# Patient Record
Sex: Male | Born: 1995 | Race: White | Hispanic: No | Marital: Married | Smoking: Former smoker
Health system: Southern US, Community
[De-identification: ages and names within clinical notes are randomized; demographics above are authoritative.]

## PROBLEM LIST (undated history)

## (undated) DIAGNOSIS — N19 Unspecified kidney failure: Secondary | ICD-10-CM

## (undated) DIAGNOSIS — K219 Gastro-esophageal reflux disease without esophagitis: Secondary | ICD-10-CM

## (undated) DIAGNOSIS — N028 Recurrent and persistent hematuria with other morphologic changes: Secondary | ICD-10-CM

## (undated) DIAGNOSIS — J189 Pneumonia, unspecified organism: Secondary | ICD-10-CM

## (undated) DIAGNOSIS — N02B9 Other recurrent and persistent immunoglobulin A nephropathy: Secondary | ICD-10-CM

## (undated) DIAGNOSIS — N170 Acute kidney failure with tubular necrosis: Secondary | ICD-10-CM

## (undated) DIAGNOSIS — N12 Tubulo-interstitial nephritis, not specified as acute or chronic: Secondary | ICD-10-CM

## (undated) DIAGNOSIS — R7989 Other specified abnormal findings of blood chemistry: Secondary | ICD-10-CM

## (undated) HISTORY — PX: NO PAST SURGERIES: SHX2092

## (undated) HISTORY — PX: RENAL BIOPSY: SHX156

## (undated) HISTORY — PX: WISDOM TOOTH EXTRACTION: SHX21

## (undated) HISTORY — PX: GALLBLADDER SURGERY: SHX652

## (undated) HISTORY — DX: Other recurrent and persistent immunoglobulin A nephropathy: N02.B9

## (undated) HISTORY — DX: Recurrent and persistent hematuria with other morphologic changes: N02.8

## (undated) HISTORY — DX: Acute kidney failure with tubular necrosis: N17.0

---
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
## 2014-06-30 ENCOUNTER — Encounter (HOSPITAL_COMMUNITY): Payer: Self-pay | Admitting: Emergency Medicine

## 2014-06-30 ENCOUNTER — Emergency Department (HOSPITAL_COMMUNITY)
Admission: EM | Admit: 2014-06-30 | Discharge: 2014-07-01 | Disposition: A | Discharge status: Home / Self Care | Payer: BLUE CROSS/BLUE SHIELD | Attending: Emergency Medicine | Admitting: Emergency Medicine

## 2014-06-30 ENCOUNTER — Emergency Department (HOSPITAL_COMMUNITY): Payer: BLUE CROSS/BLUE SHIELD

## 2014-06-30 DIAGNOSIS — R1032 Left lower quadrant pain: Secondary | ICD-10-CM | POA: Insufficient documentation

## 2014-06-30 DIAGNOSIS — R1031 Right lower quadrant pain: Secondary | ICD-10-CM | POA: Diagnosis not present

## 2014-06-30 DIAGNOSIS — R1030 Lower abdominal pain, unspecified: Secondary | ICD-10-CM | POA: Diagnosis not present

## 2014-06-30 DIAGNOSIS — R112 Nausea with vomiting, unspecified: Secondary | ICD-10-CM | POA: Insufficient documentation

## 2014-06-30 DIAGNOSIS — Z720 Tobacco use: Secondary | ICD-10-CM | POA: Diagnosis not present

## 2014-06-30 DIAGNOSIS — R109 Unspecified abdominal pain: Secondary | ICD-10-CM

## 2014-06-30 LAB — BASIC METABOLIC PANEL
Anion gap: 11 (ref 5–15)
BUN: 11 mg/dL (ref 6–23)
CO2: 25 mmol/L (ref 19–32)
Calcium: 9.5 mg/dL (ref 8.4–10.5)
Chloride: 102 mmol/L (ref 96–112)
Creatinine, Ser: 1.25 mg/dL (ref 0.50–1.35)
GFR calc Af Amer: >90 mL/min (ref >90)
GFR calc non Af Amer: 82 mL/min — ABNORMAL LOW (ref >90)
Glucose, Bld: 101 mg/dL — ABNORMAL HIGH (ref 70–99)
Potassium: 3.8 mmol/L (ref 3.5–5.1)
Sodium: 138 mmol/L (ref 135–145)

## 2014-06-30 LAB — CBC
HCT: 44.6 % (ref 39.0–52.0)
Hemoglobin: 15.2 g/dL (ref 13.0–17.0)
MCH: 31.2 pg (ref 26.0–34.0)
MCHC: 34.1 g/dL (ref 30.0–36.0)
MCV: 91.6 fL (ref 78.0–100.0)
PLATELETS: 174 10*3/uL (ref 150–400)
RBC: 4.87 MIL/uL (ref 4.22–5.81)
RDW: 12 % (ref 11.5–15.5)
WBC: 7.8 10*3/uL (ref 4.0–10.5)

## 2014-06-30 IMAGING — CT CT ABD-PELV W/ CM
2 of 4 series · 4 of 46 positions shown, 6 images · IV contrast (Iodine)
Comparison: None.

CLINICAL DATA: Acute onset of nausea and vomiting. Lower abdominal
pain, radiating to both flanks. Initial encounter.

EXAM:
CT ABDOMEN AND PELVIS WITH CONTRAST
TECHNIQUE: Multidetector CT imaging of the abdomen and pelvis was performed
using the standard protocol following bolus administration of
intravenous contrast.
CONTRAST:  100mL OMNIPAQUE IOHEXOL 300 MG/ML  SOLN

[Series 204: coronals · coronal · 0.50mm/px · 3 of 118 slices shown, 4 images]
[im 27/118  soft-tissue]
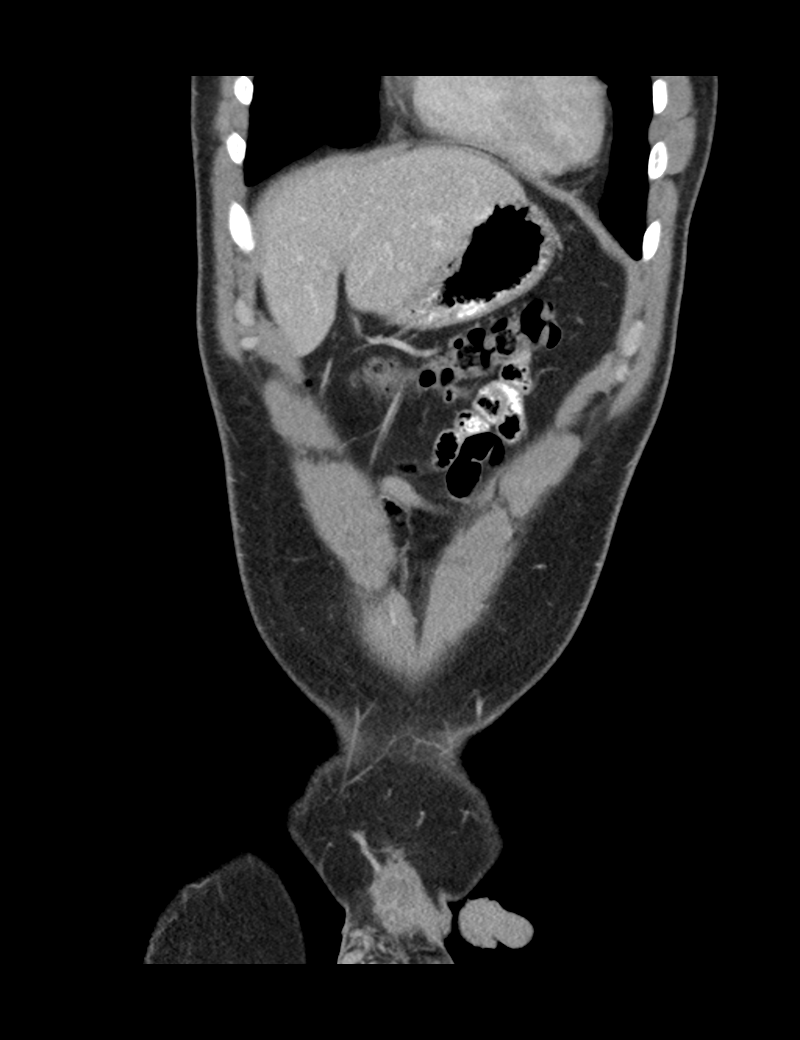
[im 27/118  bone]
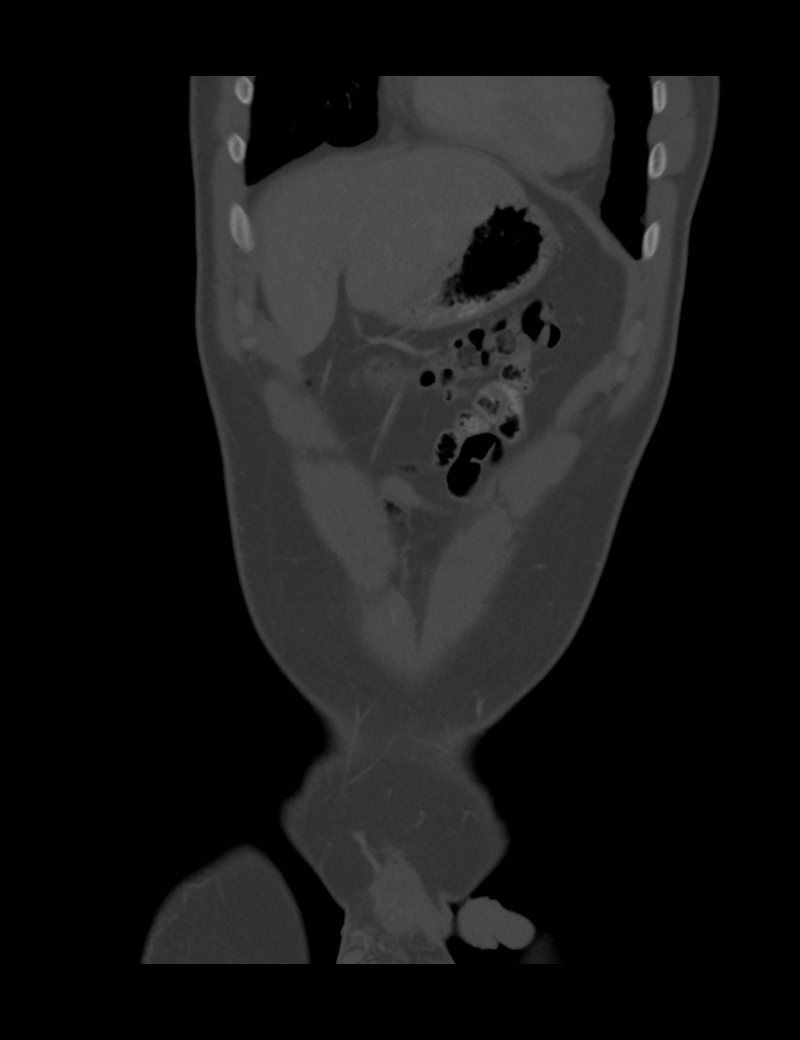
[im 66/118  soft-tissue]
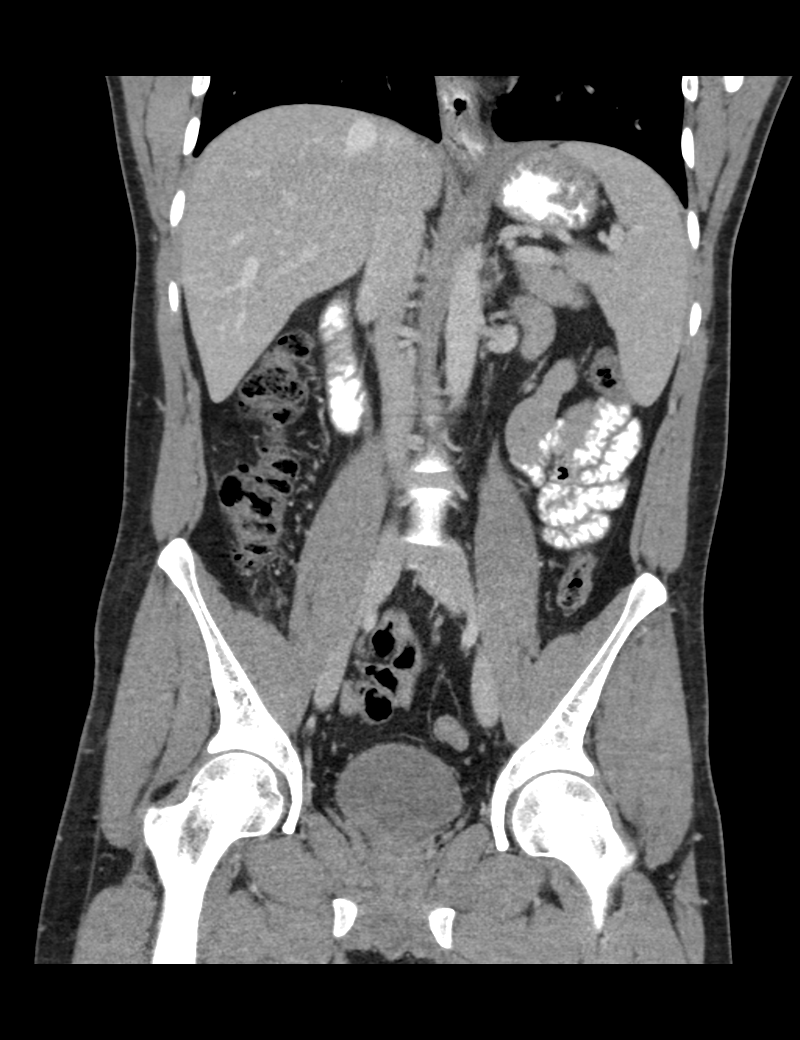
[im 92/118  soft-tissue]
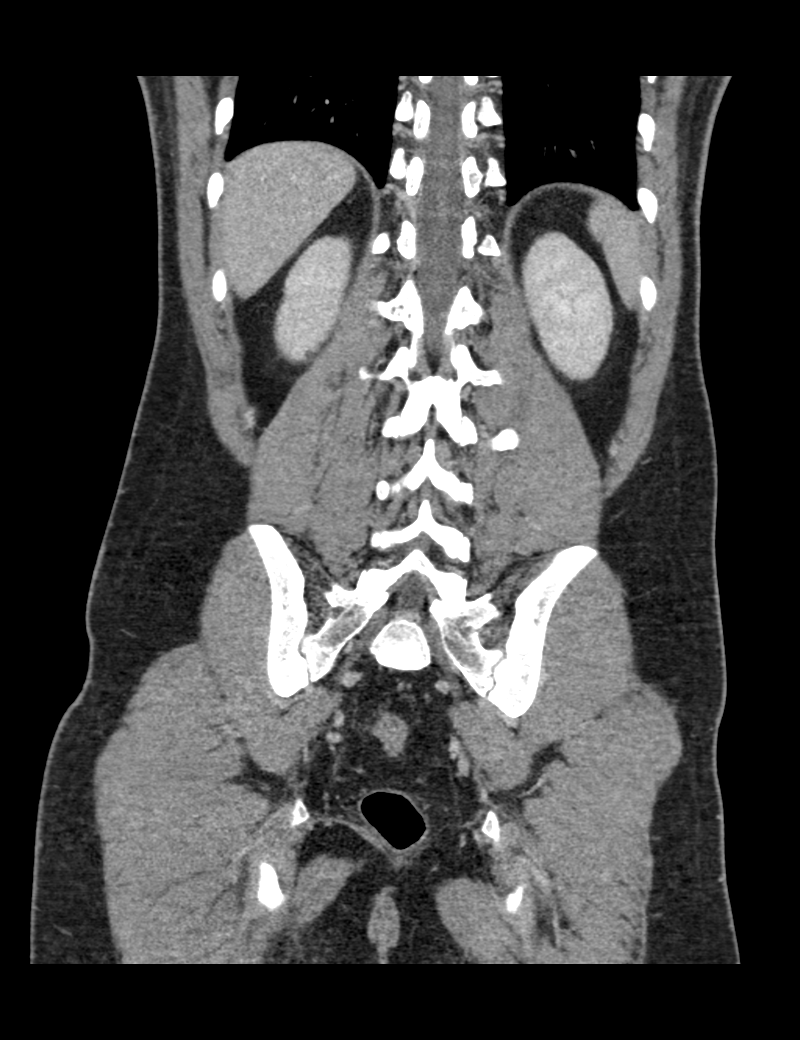

[Series 205: sagittals · sagittal · 0.50mm/px · 1 of 157 slices shown, 2 images]
[im 53/157  soft-tissue]
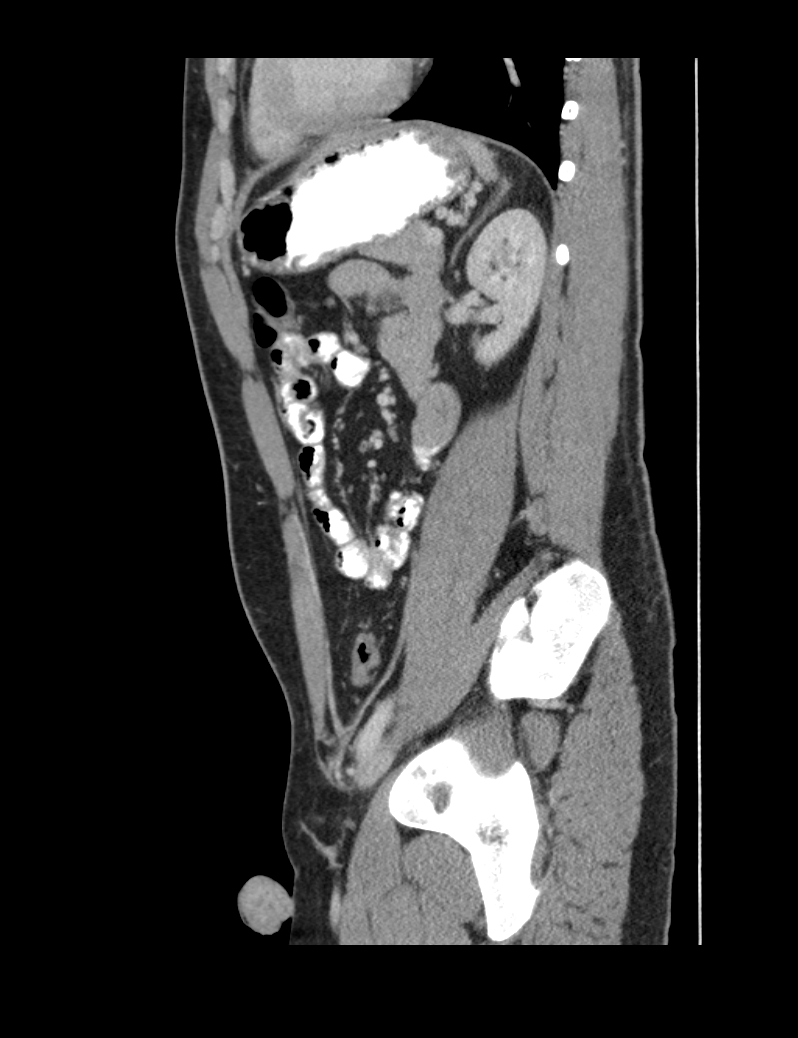
[im 53/157  bone]
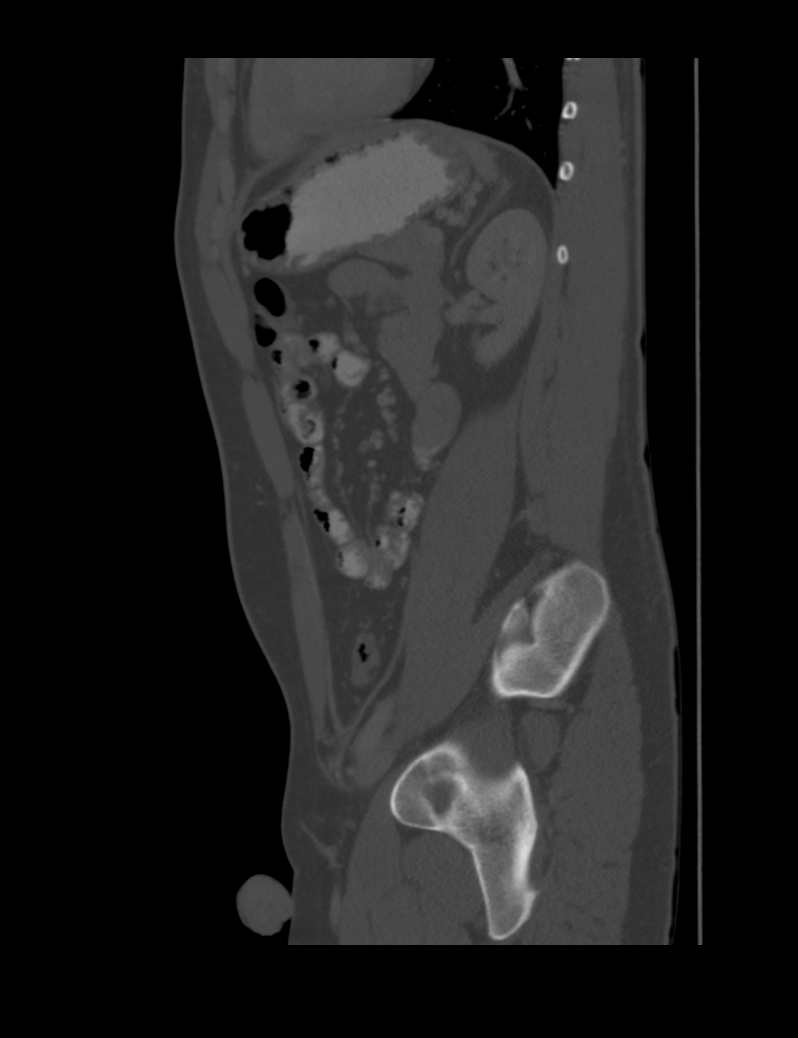

[4 of 46 positions shown; findings below may reference images not displayed]

FINDINGS: The visualized lung bases are clear.

The liver and spleen are unremarkable in appearance. The gallbladder
is within normal limits. The pancreas and adrenal glands are
unremarkable.

A 1.7 cm cyst is noted at the interpole region of the right kidney.
Smaller bilateral renal cysts are seen, including a nonspecific
minimally hypodense focus arising at the lower pole of the left
kidney. There is no evidence of hydronephrosis. No renal or ureteral
stones are seen. No perinephric stranding is appreciated.

No free fluid is identified. The small bowel is unremarkable in
appearance. The stomach is within normal limits. No acute vascular
abnormalities are seen.

The appendix is diminutive an unremarkable appearance. There is no
evidence for appendicitis. The colon is largely decompressed and
unremarkable in appearance.

The bladder is mildly distended and grossly unremarkable. The
prostate remains normal in size. No inguinal lymphadenopathy is
seen.

No acute osseous abnormalities are identified.
IMPRESSION: 1. No acute abnormality seen within the abdomen or pelvis.
2. Small bilateral renal cysts seen.

## 2014-06-30 MED ORDER — SODIUM CHLORIDE 0.9 % IV BOLUS (SEPSIS)
1000.0000 mL | Freq: Once | INTRAVENOUS | Status: AC
  Administered 2014-06-30: 1000 mL via INTRAVENOUS

## 2014-06-30 MED ORDER — ONDANSETRON HCL 4 MG/2ML IJ SOLN
4.0000 mg | Freq: Once | INTRAMUSCULAR | Status: AC
  Administered 2014-06-30: 4 mg via INTRAVENOUS
  Filled 2014-06-30: qty 2

## 2014-06-30 MED ORDER — IOHEXOL 300 MG/ML  SOLN
100.0000 mL | Freq: Once | INTRAMUSCULAR | Status: AC | PRN
  Administered 2014-06-30: 100 mL via INTRAVENOUS

## 2014-06-30 MED ORDER — IOHEXOL 300 MG/ML  SOLN
25.0000 mL | Freq: Once | INTRAMUSCULAR | Status: AC | PRN
  Administered 2014-06-30: 25 mL via ORAL

## 2014-06-30 MED ORDER — ONDANSETRON HCL 4 MG PO TABS: 4.0000 mg | ORAL_TABLET | Freq: Four times a day (QID) | ORAL | Status: DC

## 2014-06-30 MED ORDER — ACETAMINOPHEN 500 MG PO TABS
1000.0000 mg | ORAL_TABLET | Freq: Once | ORAL | Status: AC
  Administered 2014-06-30: 1000 mg via ORAL
  Filled 2014-06-30: qty 2

## 2014-06-30 NOTE — Discharge Instructions (Signed)
Abdominal Pain °Many things can cause abdominal pain. Usually, abdominal pain is not caused by a disease and will improve without treatment. It can often be observed and treated at home. Your health care provider will do a physical exam and possibly order blood tests and X-rays to help determine the seriousness of your pain. However, in many cases, more time must pass before a clear cause of the pain can be found. Before that point, your health care provider may not know if you need more testing or further treatment. °HOME CARE INSTRUCTIONS  °Monitor your abdominal pain for any changes. The following actions may help to alleviate any discomfort you are experiencing: °· Only take over-the-counter or prescription medicines as directed by your health care provider. °· Do not take laxatives unless directed to do so by your health care provider. °· Try a clear liquid diet (broth, tea, or water) as directed by your health care provider. Slowly move to a bland diet as tolerated. °SEEK MEDICAL CARE IF: °· You have unexplained abdominal pain. °· You have abdominal pain associated with nausea or diarrhea. °· You have pain when you urinate or have a bowel movement. °· You experience abdominal pain that wakes you in the night. °· You have abdominal pain that is worsened or improved by eating food. °· You have abdominal pain that is worsened with eating fatty foods. °· You have a fever. °SEEK IMMEDIATE MEDICAL CARE IF:  °· Your pain does not go away within 2 hours. °· You keep throwing up (vomiting). °· Your pain is felt only in portions of the abdomen, such as the right side or the left lower portion of the abdomen. °· You pass bloody or black tarry stools. °MAKE SURE YOU: °· Understand these instructions.   °· Will watch your condition.   °· Will get help right away if you are not doing well or get worse.   °Document Released: 01/22/2005 Document Revised: 04/19/2013 Document Reviewed: 12/22/2012 °[employer]® Patient Information  ©2015 [employer]. This information is not intended to replace advice given to you by your health care provider. Make sure you discuss any questions you have with your health care provider. ° °Nausea and Vomiting °Nausea is a sick feeling that often comes before throwing up (vomiting). Vomiting is a reflex where stomach contents come out of your mouth. Vomiting can cause severe loss of body fluids (dehydration). Children and elderly adults can become dehydrated quickly, especially if they also have diarrhea. Nausea and vomiting are symptoms of a condition or disease. It is important to find the cause of your symptoms. °CAUSES  °· Direct irritation of the stomach lining. This irritation can result from increased acid production (gastroesophageal reflux disease), infection, food poisoning, taking certain medicines (such as nonsteroidal anti-inflammatory drugs), alcohol use, or tobacco use. °· Signals from the brain. These signals could be caused by a headache, heat exposure, an inner ear disturbance, increased pressure in the brain from injury, infection, a tumor, or a concussion, pain, emotional stimulus, or metabolic problems. °· An obstruction in the gastrointestinal tract (bowel obstruction). °· Illnesses such as diabetes, hepatitis, gallbladder problems, appendicitis, kidney problems, cancer, sepsis, atypical symptoms of a heart attack, or eating disorders. °· Medical treatments such as chemotherapy and radiation. °· Receiving medicine that makes you sleep (general anesthetic) during surgery. °DIAGNOSIS °Your caregiver may ask for tests to be done if the problems do not improve after a few days. Tests may also be done if symptoms are severe or if the reason for the nausea   and vomiting is not clear. Tests may include: °· Urine tests. °· Blood tests. °· Stool tests. °· Cultures (to look for evidence of infection). °· X-rays or other imaging studies. °Test results can help your caregiver make decisions about  treatment or the need for additional tests. °TREATMENT °You need to stay well hydrated. Drink frequently but in small amounts. You may wish to drink water, sports drinks, clear broth, or eat frozen ice pops or gelatin dessert to help stay hydrated. When you eat, eating slowly may help prevent nausea. There are also some antinausea medicines that may help prevent nausea. °HOME CARE INSTRUCTIONS  °· Take all medicine as directed by your caregiver. °· If you do not have an appetite, do not force yourself to eat. However, you must continue to drink fluids. °· If you have an appetite, eat a normal diet unless your caregiver tells you differently. °¨ Eat a variety of complex carbohydrates (rice, wheat, potatoes, bread), lean meats, yogurt, fruits, and vegetables. °¨ Avoid high-fat foods because they are more difficult to digest. °· Drink enough water and fluids to keep your urine clear or pale yellow. °· If you are dehydrated, ask your caregiver for specific rehydration instructions. Signs of dehydration may include: °¨ Severe thirst. °¨ Dry lips and mouth. °¨ Dizziness. °¨ Dark urine. °¨ Decreasing urine frequency and amount. °¨ Confusion. °¨ Rapid breathing or pulse. °SEEK IMMEDIATE MEDICAL CARE IF:  °· You have blood or brown flecks (like coffee grounds) in your vomit. °· You have black or bloody stools. °· You have a severe headache or stiff neck. °· You are confused. °· You have severe abdominal pain. °· You have chest pain or trouble breathing. °· You do not urinate at least once every 8 hours. °· You develop cold or clammy skin. °· You continue to vomit for longer than 24 to 48 hours. °· You have a fever. °MAKE SURE YOU:  °· Understand these instructions. °· Will watch your condition. °· Will get help right away if you are not doing well or get worse. °Document Released: 04/14/2005 Document Revised: 07/07/2011 Document Reviewed: 09/11/2010 °[employer]® Patient Information ©2015 [employer]. This information is not  intended to replace advice given to you by your health care provider. Make sure you discuss any questions you have with your health care provider. ° °

## 2014-06-30 NOTE — ED Notes (Signed)
Pt reports mid afternoon today he began to feel sick and nauseated, admits to multiple vomiting episodes.  Later in the day pt experienced lower abdominal pain that radiated to bilateral sides.  Denies urinary symptoms.  Pt was seen at urgent care earlier and given Tamiflu

## 2014-06-30 NOTE — ED Provider Notes (Signed)
CSN: [id number]     Arrival date & time 06/30/14  1800 History   First MD Initiated Contact with Patient 06/30/14 2027     Chief Complaint  Patient presents with  . Emesis     (Consider location/radiation/quality/duration/timing/severity/associated sxs/prior Treatment) HPI Comments: Patient presents to the ER for evaluation of abdominal pain. Symptoms began this morning. Patient reports nausea and vomiting through the course of the day. He has been experiencing lower abdominal cramping pain. This waxes and wanes. He has not noticed a fever. He was seen at urgent care earlier and told he had the flu, started on Tamiflu. Since then he has continued to have nausea and vomiting.  Patient is a 19 y.o. male presenting with vomiting.  Emesis Associated symptoms: abdominal pain     History reviewed. No pertinent past medical history. History reviewed. No pertinent past surgical history. No family history on file. History  Substance Use Topics  . Smoking status: Current Every Day Smoker  . Smokeless tobacco: Not on file  . Alcohol Use: Yes    Review of Systems  Constitutional: Negative for fever.  Gastrointestinal: Positive for nausea, vomiting and abdominal pain.  Genitourinary: Negative.   All other systems reviewed and are negative.     Allergies  Review of patient's allergies indicates no known allergies.  Home Medications   Prior to Admission medications   Medication Sig Start Date End Date Taking? Authorizing Provider  ondansetron (ZOFRAN) 4 MG tablet Take 1 tablet (4 mg total) by mouth every 6 (six) hours. 06/30/14   Gilda Creasehristopher J. Tayleigh Wetherell, MD   BP 108/53 mmHg  Pulse 87  Temp(Src) 98 F (36.7 C) (Oral)  Resp 17  Wt 155 lb (70.308 kg)  SpO2 99% Physical Exam  Constitutional: He is oriented to person, place, and time. He appears well-developed and well-nourished. No distress.  HENT:  Head: Normocephalic and atraumatic.  Right Ear: Hearing normal.  Left Ear: Hearing  normal.  Nose: Nose normal.  Mouth/Throat: Oropharynx is clear and moist and mucous membranes are normal.  Eyes: Conjunctivae and EOM are normal. Pupils are equal, round, and reactive to light.  Neck: Normal range of motion. Neck supple.  Cardiovascular: Regular rhythm, S1 normal and S2 normal.  Exam reveals no gallop and no friction rub.   No murmur heard. Pulmonary/Chest: Effort normal and breath sounds normal. No respiratory distress. He exhibits no tenderness.  Abdominal: Soft. Normal appearance and bowel sounds are normal. There is no hepatosplenomegaly. There is tenderness in the right lower quadrant, suprapubic area and left lower quadrant. There is no rebound, no guarding, no tenderness at McBurney's point and negative Murphy's sign. No hernia.  Musculoskeletal: Normal range of motion.  Neurological: He is alert and oriented to person, place, and time. He has normal strength. No cranial nerve deficit or sensory deficit. Coordination normal. GCS eye subscore is 4. GCS verbal subscore is 5. GCS motor subscore is 6.  Skin: Skin is warm, dry and intact. No rash noted. No cyanosis.  Psychiatric: He has a normal mood and affect. His speech is normal and behavior is normal. Thought content normal.  Nursing note and vitals reviewed.   ED Course  Procedures (including critical care time) Labs Review Labs Reviewed  BASIC METABOLIC PANEL - Abnormal; Notable for the following:    Glucose, Bld 101 (*)    GFR calc non Af Amer 82 (*)    All other components within normal limits  URINALYSIS, ROUTINE W REFLEX MICROSCOPIC - Abnormal; Notable  for the following:    Color, Urine AMBER (*)    Hgb urine dipstick LARGE (*)    Ketones, ur 15 (*)    Protein, ur 30 (*)    All other components within normal limits  URINE MICROSCOPIC-ADD ON - Abnormal; Notable for the following:    Bacteria, UA MANY (*)    All other components within normal limits  CBC    Imaging Review Ct Abdomen Pelvis W  Contrast  06/30/2014   CLINICAL DATA:  Acute onset of nausea and vomiting. Lower abdominal pain, radiating to both flanks. Initial encounter.  EXAM: CT ABDOMEN AND PELVIS WITH CONTRAST  TECHNIQUE: Multidetector CT imaging of the abdomen and pelvis was performed using the standard protocol following bolus administration of intravenous contrast.  CONTRAST:  OMNIPAQUE IOHEXOL 300 MG/ML  SOLN  COMPARISON:  None.  FINDINGS: The visualized lung bases are clear.  The liver and spleen are unremarkable in appearance. The gallbladder is within normal limits. The pancreas and adrenal glands are unremarkable.  A 1.7 cm cyst is noted at the interpole region of the right kidney. Smaller bilateral renal cysts are seen, including a nonspecific minimally hypodense focus arising at the lower pole of the left kidney. There is no evidence of hydronephrosis. No renal or ureteral stones are seen. No perinephric stranding is appreciated.  No free fluid is identified. The small bowel is unremarkable in appearance. The stomach is within normal limits. No acute vascular abnormalities are seen.  The appendix is diminutive an unremarkable appearance. There is no evidence for appendicitis. The colon is largely decompressed and unremarkable in appearance.  The bladder is mildly distended and grossly unremarkable. The prostate remains normal in size. No inguinal lymphadenopathy is seen.  No acute osseous abnormalities are identified.  IMPRESSION: 1. No acute abnormality seen within the abdomen or pelvis. 2. Small bilateral renal cysts seen.   Electronically Signed   By: Roanna Raider M.D.   On: 06/30/2014 23:20     EKG Interpretation None      MDM   Final diagnoses:  Abdominal pain  Nausea and vomiting, vomiting of unspecified type   Patient presented to the ER for evaluation of nausea, vomiting and abdominal pain. Patient did have diffuse lower abdominal tenderness including the right lower portion of the abdomen. His  workup, however, has been unremarkable. This includes CT scan did not show any evidence of abnormality of the appendix. Patient treated with fluids and will be discharged with continued symptomatically treatment.    Gilda Crease, MD 07/02/14 [phone]

## 2014-07-01 LAB — URINALYSIS, ROUTINE W REFLEX MICROSCOPIC
BILIRUBIN URINE: NEGATIVE
Glucose, UA: NEGATIVE mg/dL
Ketones, ur: 15 mg/dL — AB
Leukocytes, UA: NEGATIVE
Nitrite: NEGATIVE
PROTEIN: 30 mg/dL — AB
Specific Gravity, Urine: 1.025 (ref 1.005–1.030)
Urobilinogen, UA: 0.2 mg/dL (ref 0.0–1.0)
pH: 5 (ref 5.0–8.0)

## 2014-07-01 LAB — URINE MICROSCOPIC-ADD ON

## 2014-12-12 ENCOUNTER — Emergency Department (HOSPITAL_COMMUNITY): Payer: BLUE CROSS/BLUE SHIELD

## 2014-12-12 ENCOUNTER — Encounter (HOSPITAL_COMMUNITY): Payer: Self-pay | Admitting: Neurology

## 2014-12-12 ENCOUNTER — Emergency Department (HOSPITAL_COMMUNITY)
Admission: EM | Admit: 2014-12-12 | Discharge: 2014-12-12 | Disposition: A | Discharge status: Home / Self Care | Payer: BLUE CROSS/BLUE SHIELD | Attending: Emergency Medicine | Admitting: Emergency Medicine

## 2014-12-12 DIAGNOSIS — Z720 Tobacco use: Secondary | ICD-10-CM | POA: Insufficient documentation

## 2014-12-12 DIAGNOSIS — R109 Unspecified abdominal pain: Secondary | ICD-10-CM | POA: Diagnosis present

## 2014-12-12 DIAGNOSIS — N3090 Cystitis, unspecified without hematuria: Secondary | ICD-10-CM | POA: Diagnosis not present

## 2014-12-12 HISTORY — DX: Tubulo-interstitial nephritis, not specified as acute or chronic: N12

## 2014-12-12 LAB — CBC
HCT: 39.4 % (ref 39.0–52.0)
HEMOGLOBIN: 13.6 g/dL (ref 13.0–17.0)
MCH: 31 pg (ref 26.0–34.0)
MCHC: 34.5 g/dL (ref 30.0–36.0)
MCV: 89.7 fL (ref 78.0–100.0)
Platelets: 178 10*3/uL (ref 150–400)
RBC: 4.39 MIL/uL (ref 4.22–5.81)
RDW: 11.8 % (ref 11.5–15.5)
WBC: 10.6 10*3/uL — ABNORMAL HIGH (ref 4.0–10.5)

## 2014-12-12 LAB — URINE MICROSCOPIC-ADD ON

## 2014-12-12 LAB — BASIC METABOLIC PANEL
ANION GAP: 14 (ref 5–15)
BUN: 12 mg/dL (ref 6–20)
CO2: 20 mmol/L — AB (ref 22–32)
Calcium: 9.7 mg/dL (ref 8.9–10.3)
Chloride: 102 mmol/L (ref 101–111)
Creatinine, Ser: 1.22 mg/dL (ref 0.61–1.24)
GFR calc Af Amer: >60 mL/min (ref >60)
GFR calc non Af Amer: >60 mL/min (ref >60)
Glucose, Bld: 105 mg/dL — ABNORMAL HIGH (ref 65–99)
POTASSIUM: 3.4 mmol/L — AB (ref 3.5–5.1)
Sodium: 136 mmol/L (ref 135–145)

## 2014-12-12 LAB — URINALYSIS, ROUTINE W REFLEX MICROSCOPIC
GLUCOSE, UA: NEGATIVE mg/dL
Ketones, ur: 15 mg/dL — AB
Nitrite: NEGATIVE
PH: 5.5 (ref 5.0–8.0)
Specific Gravity, Urine: 1.034 — ABNORMAL HIGH (ref 1.005–1.030)
Urobilinogen, UA: 0.2 mg/dL (ref 0.0–1.0)

## 2014-12-12 IMAGING — CT CT RENAL STONE PROTOCOL
2 of 4 series · 16 of 46 positions shown, 18 images · non-contrast
Comparison: [DATE]

CLINICAL DATA: Vomiting for 2 days, kidney infection

EXAM:
CT ABDOMEN AND PELVIS WITHOUT CONTRAST
TECHNIQUE: Multidetector CT imaging of the abdomen and pelvis was performed
following the standard protocol without IV contrast.

[Series 2: stone study 5.0 i30f 1 · axial · 0.67mm/px · z∈[-1276,-851]mm · 13 of 93 slices shown, 15 images]
[im 4/93  soft-tissue]
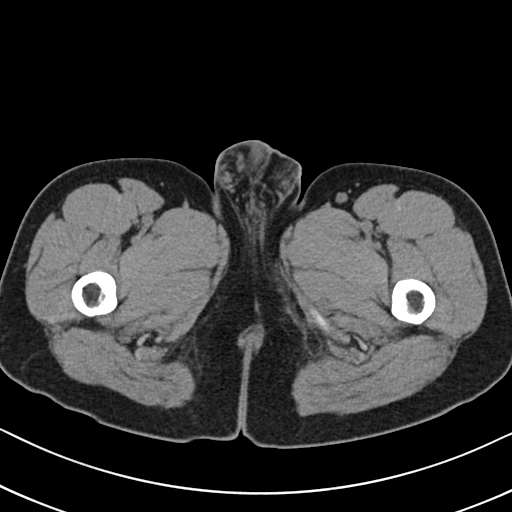
[im 4/93  bone]
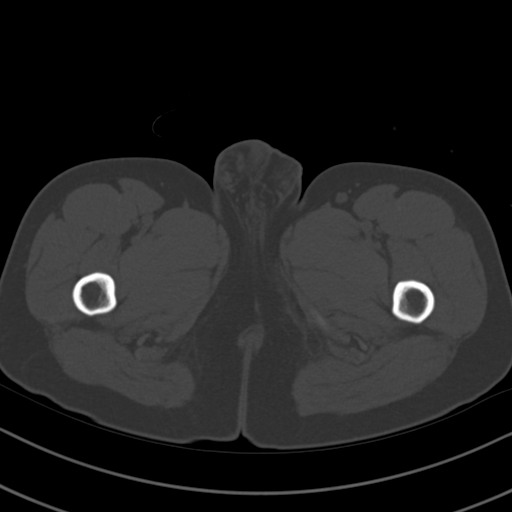
[im 11/93  soft-tissue]
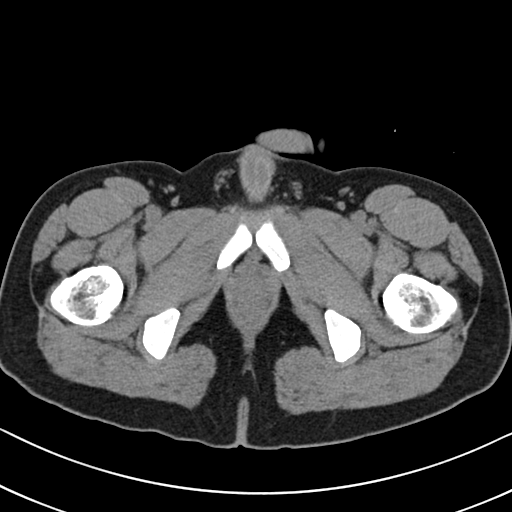
[im 18/93  soft-tissue]
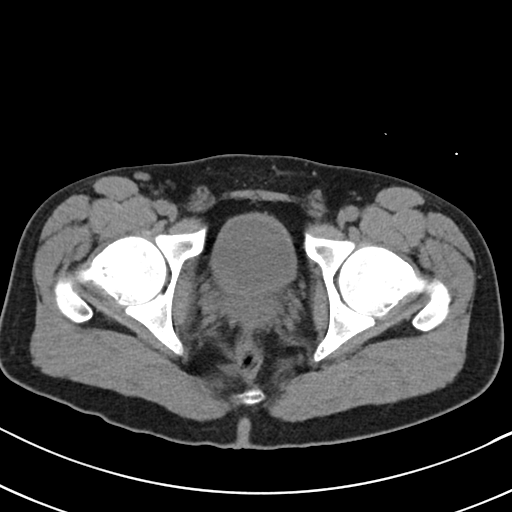
[im 25/93  soft-tissue]
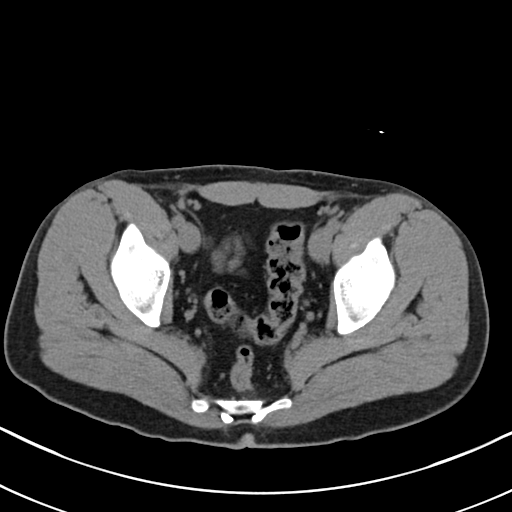
[im 32/93  soft-tissue]
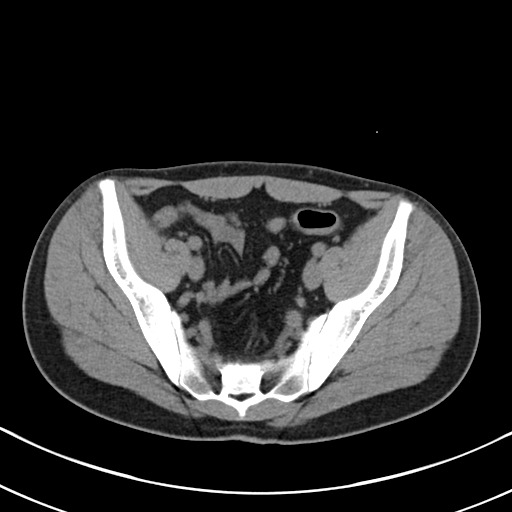
[im 39/93  soft-tissue]
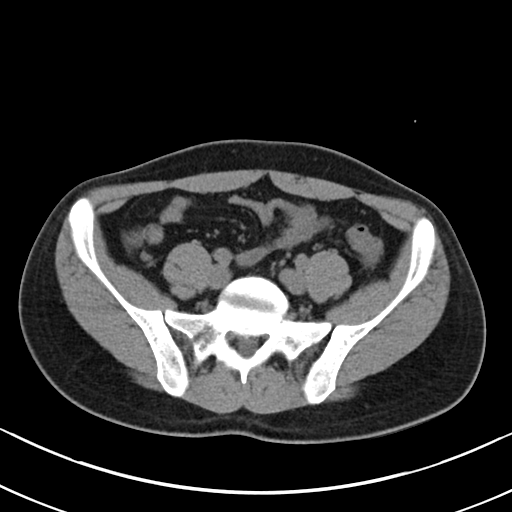
[im 47/93  soft-tissue]
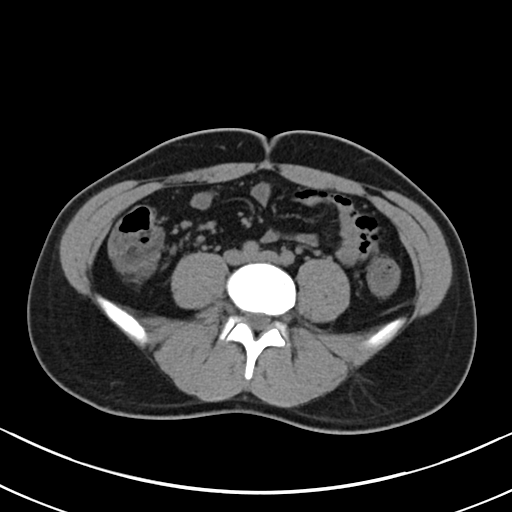
[im 54/93  soft-tissue]
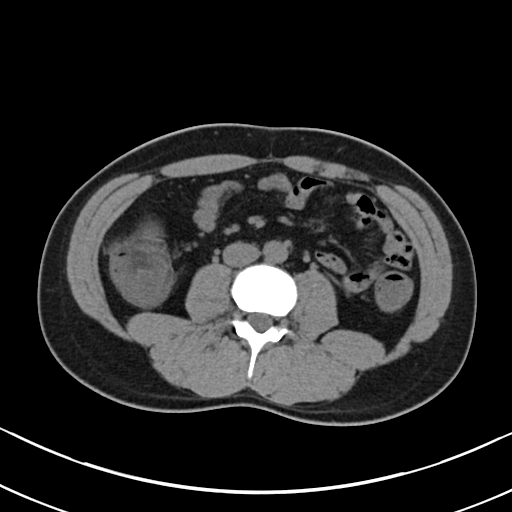
[im 61/93  soft-tissue]
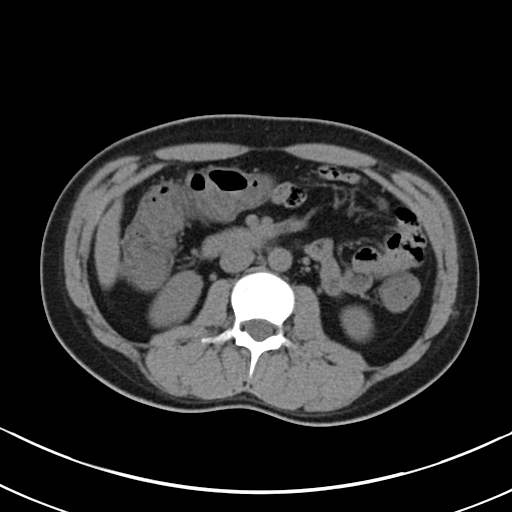
[im 61/93  bone]
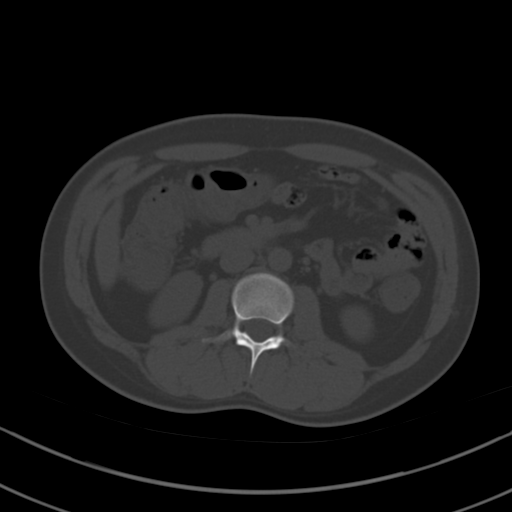
[im 68/93  soft-tissue]
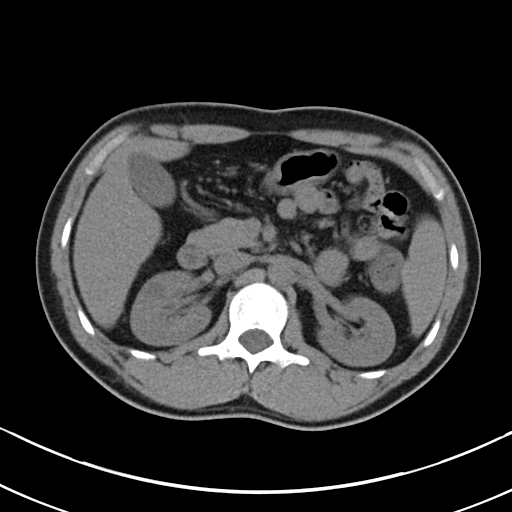
[im 75/93  soft-tissue]
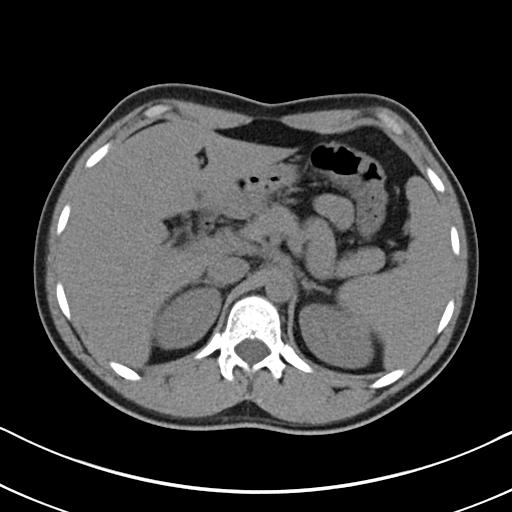
[im 82/93  soft-tissue]
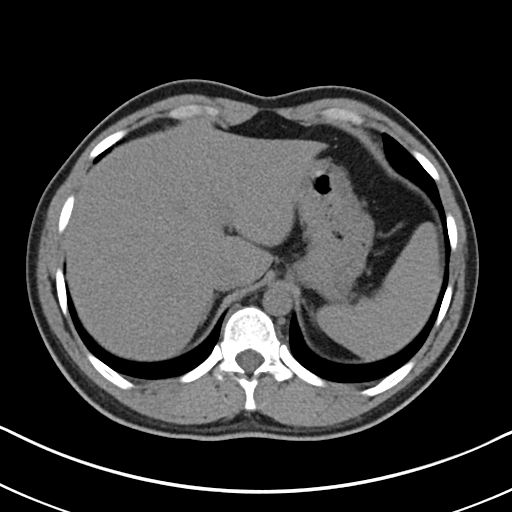
[im 89/93  soft-tissue]
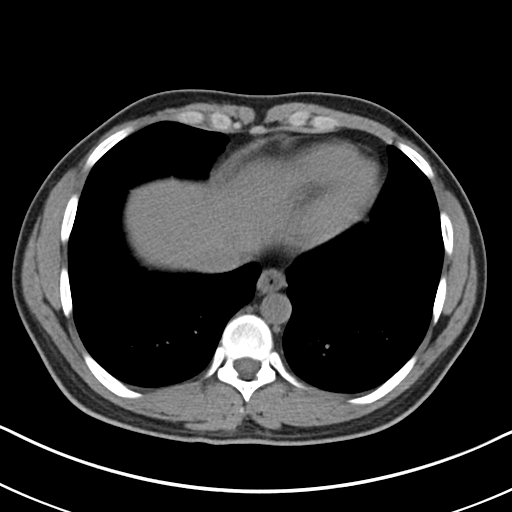

[Series 5: coronal soft tissue · coronal · 0.66mm/px · 3 of 73 slices shown]
[im 25/73  soft-tissue]
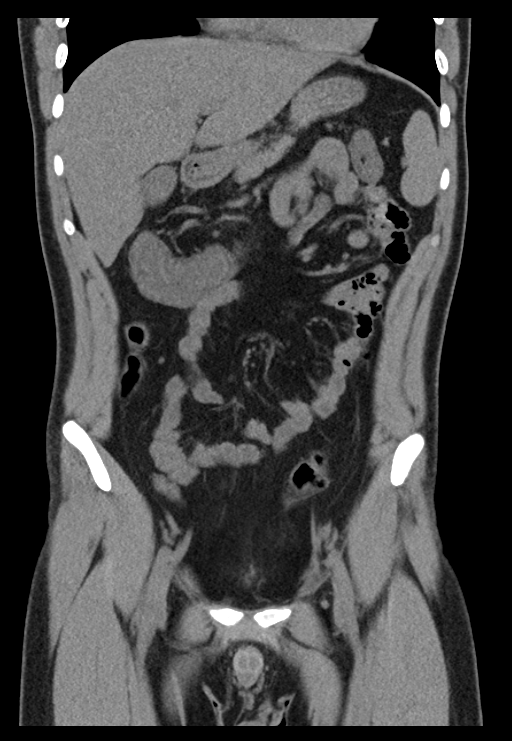
[im 33/73  soft-tissue]
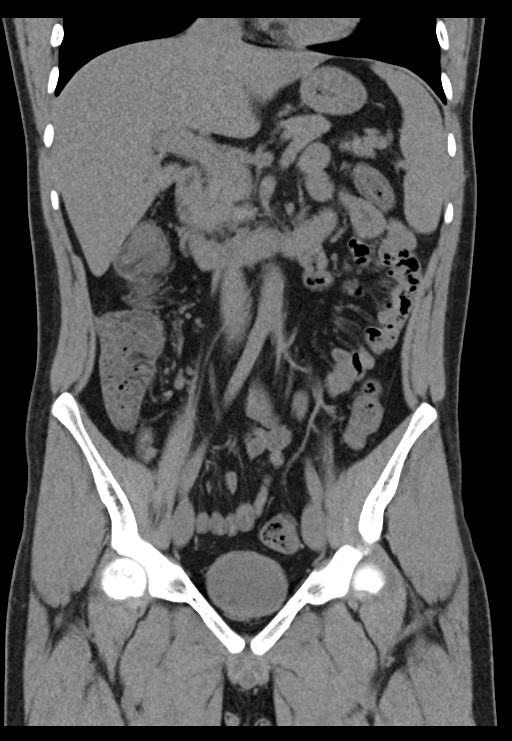
[im 41/73  soft-tissue]
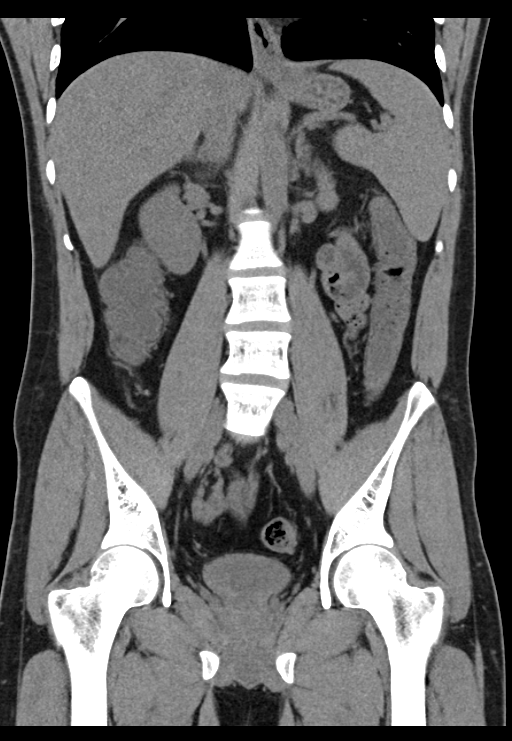

[16 of 46 positions shown; findings below may reference images not displayed]

FINDINGS: Lung bases are unremarkable. Sagittal images of the spine are
unremarkable.

Unenhanced liver, pancreas, spleen and adrenal glands are
unremarkable. No calcified gallstones are noted within gallbladder.
No aortic aneurysm. Unenhanced kidneys are symmetrical in size. No
nephrolithiasis. No hydronephrosis or hydroureter.

No aortic aneurysm.

There is no pericecal inflammation.

Normal appendix partially visualized in coronal image 36. Mild
levoscoliosis lumbar spine.

Bilateral distal ureter is unremarkable. No calcified ureteral
calculi. There is mild thickening of urinary bladder wall. Mild
cystitis cannot be excluded. Prostate gland is unremarkable.

No small bowel obstruction. No ascites or free air. No adenopathy.
Again noted high-density exophytic cyst in left kidney stable in
size in appearance from prior exam measures about 5 mm. Right renal
cyst again noted measures 1.7 cm.
IMPRESSION: 1. There is no nephrolithiasis.  No hydronephrosis or hydroureter.
2. Stable bilateral renal cysts.
3.  No calcified ureteral calculi.
4. Normal appendix partially visualized.
5. Mild thickening of urinary bladder wall. Cystitis cannot be
excluded. Clinical correlation is necessary.

## 2014-12-12 MED ORDER — SODIUM CHLORIDE 0.9 % IV BOLUS (SEPSIS)
1000.0000 mL | Freq: Once | INTRAVENOUS | Status: AC
  Administered 2014-12-12: 1000 mL via INTRAVENOUS

## 2014-12-12 MED ORDER — ACETAMINOPHEN 325 MG PO TABS
650.0000 mg | ORAL_TABLET | Freq: Once | ORAL | Status: AC
  Administered 2014-12-12: 650 mg via ORAL
  Filled 2014-12-12: qty 2

## 2014-12-12 MED ORDER — ONDANSETRON 4 MG PO TBDP: ORAL_TABLET | ORAL | Status: DC

## 2014-12-12 MED ORDER — ONDANSETRON 4 MG PO TBDP
4.0000 mg | ORAL_TABLET | Freq: Once | ORAL | Status: AC
  Administered 2014-12-12: 4 mg via ORAL

## 2014-12-12 MED ORDER — ONDANSETRON 4 MG PO TBDP
ORAL_TABLET | ORAL | Status: AC
  Filled 2014-12-12: qty 1

## 2014-12-12 MED ORDER — ONDANSETRON 4 MG PO TBDP: 4.0000 mg | ORAL_TABLET | Freq: Once | ORAL | Status: DC

## 2014-12-12 MED ORDER — SULFAMETHOXAZOLE-TRIMETHOPRIM 800-160 MG PO TABS: 1.0000 | ORAL_TABLET | Freq: Two times a day (BID) | ORAL | Status: DC

## 2014-12-12 MED ORDER — ONDANSETRON HCL 4 MG/2ML IJ SOLN
4.0000 mg | Freq: Once | INTRAMUSCULAR | Status: AC
  Administered 2014-12-12: 4 mg via INTRAVENOUS
  Filled 2014-12-12: qty 2

## 2014-12-12 MED ORDER — OXYCODONE-ACETAMINOPHEN 5-325 MG PO TABS
ORAL_TABLET | ORAL | Status: AC
  Filled 2014-12-12: qty 1

## 2014-12-12 MED ORDER — OXYCODONE-ACETAMINOPHEN 5-325 MG PO TABS
1.0000 | ORAL_TABLET | Freq: Once | ORAL | Status: AC
  Administered 2014-12-12: 1 via ORAL

## 2014-12-12 MED ORDER — DEXTROSE 5 % IV SOLN
1.0000 g | Freq: Once | INTRAVENOUS | Status: AC
  Administered 2014-12-12: 1 g via INTRAVENOUS
  Filled 2014-12-12: qty 10

## 2014-12-12 NOTE — ED Notes (Signed)
Pt was unable to provide a urine sample at this time. Will check back later.

## 2014-12-12 NOTE — ED Notes (Addendum)
Pt reports last night he had sudden onset fever, chills bilateral flank pain. Went to [facility], has large amount of protein and blood in urine. Has hx of pyelonephritis. Pt is a x 4. Has urine results with him.

## 2014-12-12 NOTE — Discharge Instructions (Signed)

## 2014-12-12 NOTE — ED Provider Notes (Signed)
CSN: [id number]     Arrival date & time 12/12/14  1111 History   First MD Initiated Contact with Patient 12/12/14 1512     Chief Complaint  Patient presents with  . Flank Pain     (Consider location/radiation/quality/duration/timing/severity/associated sxs/prior Treatment) Patient is a 19 y.o. male presenting with flank pain. The history is provided by the patient. No language interpreter was used.  Flank Pain This is a new problem. The current episode started yesterday. The problem occurs constantly. The problem has been gradually worsening. Associated symptoms include abdominal pain, chills, a fever, myalgias, nausea and vomiting. He has tried oral narcotics for the symptoms. The treatment provided mild relief.    Past Medical History  Diagnosis Date  . Pyelonephritis    History reviewed. No pertinent past surgical history. No family history on file. Social History  Substance Use Topics  . Smoking status: Current Every Day Smoker  . Smokeless tobacco: None  . Alcohol Use: Yes    Review of Systems  Constitutional: Positive for fever and chills.  Gastrointestinal: Positive for nausea, vomiting and abdominal pain.  Genitourinary: Positive for flank pain.  Musculoskeletal: Positive for myalgias.  All other systems reviewed and are negative.     Allergies  Review of patient's allergies indicates no known allergies.  Home Medications   Prior to Admission medications   Medication Sig Start Date End Date Taking? Authorizing Provider  ondansetron (ZOFRAN) 4 MG tablet Take 1 tablet (4 mg total) by mouth every 6 (six) hours. 06/30/14   Gilda Crease, MD   BP 126/82 mmHg  Pulse 98  Temp(Src) 98.5 F (36.9 C) (Oral)  Resp 18  SpO2 100% Physical Exam  Constitutional: He appears well-developed and well-nourished.  HENT:  Head: Normocephalic.  Eyes: Conjunctivae are normal.  Neck: Neck supple.  Cardiovascular: Normal rate and regular rhythm.   Pulmonary/Chest:  Effort normal and breath sounds normal.  Abdominal: Soft. Bowel sounds are normal.  Genitourinary: Penis normal.  Musculoskeletal: He exhibits tenderness. He exhibits no edema.  Bilateral low back pain  Neurological: He is alert.  Skin: Skin is warm.  Psychiatric: He has a normal mood and affect.  Nursing note and vitals reviewed.   ED Course  Procedures (including critical care time) Labs Review Labs Reviewed  BASIC METABOLIC PANEL - Abnormal; Notable for the following:    Potassium 3.4 (*)    CO2 20 (*)    Glucose, Bld 105 (*)    All other components within normal limits  CBC - Abnormal; Notable for the following:    WBC 10.6 (*)    All other components within normal limits  URINALYSIS, ROUTINE W REFLEX MICROSCOPIC (NOT AT [facility]) - Abnormal; Notable for the following:    Color, Urine RED (*)    APPearance TURBID (*)    Specific Gravity, Urine 1.034 (*)    Hgb urine dipstick LARGE (*)    Bilirubin Urine MODERATE (*)    Ketones, ur 15 (*)    Protein, ur >300 (*)    Leukocytes, UA SMALL (*)    All other components within normal limits  URINE MICROSCOPIC-ADD ON - Abnormal; Notable for the following:    Bacteria, UA MANY (*)    All other components within normal limits  URINE CULTURE    Imaging Review Ct Renal Stone Study  12/12/2014   CLINICAL DATA:  Vomiting for 2 days, kidney infection  EXAM: CT ABDOMEN AND PELVIS WITHOUT CONTRAST  TECHNIQUE: Multidetector CT imaging of the  abdomen and pelvis was performed following the standard protocol without IV contrast.  COMPARISON:  06/30/2014  FINDINGS: Lung bases are unremarkable. Sagittal images of the spine are unremarkable.  Unenhanced liver, pancreas, spleen and adrenal glands are unremarkable. No calcified gallstones are noted within gallbladder. No aortic aneurysm. Unenhanced kidneys are symmetrical in size. No nephrolithiasis. No hydronephrosis or hydroureter.  No aortic aneurysm.  There is no pericecal inflammation.  Normal  appendix partially visualized in coronal image 36. Mild levoscoliosis lumbar spine.  Bilateral distal ureter is unremarkable. No calcified ureteral calculi. There is mild thickening of urinary bladder wall. Mild cystitis cannot be excluded. Prostate gland is unremarkable.  No small bowel obstruction. No ascites or free air. No adenopathy. Again noted high-density exophytic cyst in left kidney stable in size in appearance from prior exam measures about 5 mm. Right renal cyst again noted measures 1.7 cm.  IMPRESSION: 1. There is no nephrolithiasis.  No hydronephrosis or hydroureter. 2. Stable bilateral renal cysts. 3.  No calcified ureteral calculi. 4. Normal appendix partially visualized. 5. Mild thickening of urinary bladder wall. Cystitis cannot be excluded. Clinical correlation is necessary.   Electronically Signed   By: Natasha Mead M.D.   On: 12/12/2014 17:11   I have personally reviewed and evaluated these images and lab results as part of my medical decision-making.   EKG Interpretation None     Lab and radiology results reviewed and shared with patient.  Patient feeling better after IV fluids and medication.  Tolerating po fluids without difficulty.  Will discharge home with bactrim and odt zofran.  Follow-up with his care provider at urgent care. MDM   Final diagnoses:  None    Cystitis.    Felicie Morn, NP 12/12/14 1610  Elwin Mocha, MD 12/12/14 [phone]

## 2014-12-12 NOTE — ED Notes (Signed)
Pt given water for fluid challenge.  Pt denies nausea, asking for more water.

## 2014-12-14 ENCOUNTER — Observation Stay (HOSPITAL_COMMUNITY)
Admission: EM | Admit: 2014-12-14 | Discharge: 2014-12-19 | Disposition: A | Discharge status: Home / Self Care | Payer: BLUE CROSS/BLUE SHIELD | Attending: Student in an Organized Health Care Education/Training Program | Admitting: Student in an Organized Health Care Education/Training Program

## 2014-12-14 ENCOUNTER — Encounter (HOSPITAL_COMMUNITY): Payer: Self-pay | Admitting: Emergency Medicine

## 2014-12-14 DIAGNOSIS — B9689 Other specified bacterial agents as the cause of diseases classified elsewhere: Secondary | ICD-10-CM

## 2014-12-14 DIAGNOSIS — R319 Hematuria, unspecified: Secondary | ICD-10-CM

## 2014-12-14 DIAGNOSIS — R197 Diarrhea, unspecified: Secondary | ICD-10-CM | POA: Insufficient documentation

## 2014-12-14 DIAGNOSIS — R310 Gross hematuria: Secondary | ICD-10-CM | POA: Diagnosis not present

## 2014-12-14 DIAGNOSIS — R1110 Vomiting, unspecified: Secondary | ICD-10-CM | POA: Insufficient documentation

## 2014-12-14 DIAGNOSIS — N029 Recurrent and persistent hematuria with unspecified morphologic changes: Secondary | ICD-10-CM

## 2014-12-14 DIAGNOSIS — R809 Proteinuria, unspecified: Secondary | ICD-10-CM | POA: Diagnosis not present

## 2014-12-14 DIAGNOSIS — N179 Acute kidney failure, unspecified: Principal | ICD-10-CM

## 2014-12-14 DIAGNOSIS — R339 Retention of urine, unspecified: Secondary | ICD-10-CM | POA: Insufficient documentation

## 2014-12-14 DIAGNOSIS — N281 Cyst of kidney, acquired: Secondary | ICD-10-CM | POA: Insufficient documentation

## 2014-12-14 DIAGNOSIS — F17210 Nicotine dependence, cigarettes, uncomplicated: Secondary | ICD-10-CM | POA: Insufficient documentation

## 2014-12-14 DIAGNOSIS — R112 Nausea with vomiting, unspecified: Secondary | ICD-10-CM | POA: Diagnosis present

## 2014-12-14 DIAGNOSIS — N390 Urinary tract infection, site not specified: Secondary | ICD-10-CM

## 2014-12-14 DIAGNOSIS — E876 Hypokalemia: Secondary | ICD-10-CM | POA: Diagnosis not present

## 2014-12-14 LAB — CBC WITH DIFFERENTIAL/PLATELET
BASOS ABS: 0 10*3/uL (ref 0.0–0.1)
Basophils Relative: 0 % (ref 0–1)
EOS ABS: 0.1 10*3/uL (ref 0.0–0.7)
EOS PCT: 1 % (ref 0–5)
HCT: 38.1 % — ABNORMAL LOW (ref 39.0–52.0)
Hemoglobin: 13.8 g/dL (ref 13.0–17.0)
Lymphocytes Relative: 16 % (ref 12–46)
Lymphs Abs: 1 10*3/uL (ref 0.7–4.0)
MCH: 31.5 pg (ref 26.0–34.0)
MCHC: 36.2 g/dL — ABNORMAL HIGH (ref 30.0–36.0)
MCV: 87 fL (ref 78.0–100.0)
MONO ABS: 0.9 10*3/uL (ref 0.1–1.0)
Monocytes Relative: 15 % — ABNORMAL HIGH (ref 3–12)
Neutro Abs: 4.2 10*3/uL (ref 1.7–7.7)
Neutrophils Relative %: 68 % (ref 43–77)
PLATELETS: 127 10*3/uL — AB (ref 150–400)
RBC: 4.38 MIL/uL (ref 4.22–5.81)
RDW: 11.9 % (ref 11.5–15.5)
WBC: 6.2 10*3/uL (ref 4.0–10.5)

## 2014-12-14 LAB — COMPREHENSIVE METABOLIC PANEL
ALK PHOS: 56 U/L (ref 38–126)
ALT: 19 U/L (ref 17–63)
AST: 28 U/L (ref 15–41)
Albumin: 3.7 g/dL (ref 3.5–5.0)
Anion gap: 10 (ref 5–15)
BILIRUBIN TOTAL: 0.8 mg/dL (ref 0.3–1.2)
BUN: 23 mg/dL — ABNORMAL HIGH (ref 6–20)
CALCIUM: 9.2 mg/dL (ref 8.9–10.3)
CO2: 22 mmol/L (ref 22–32)
CREATININE: 2.87 mg/dL — AB (ref 0.61–1.24)
Chloride: 103 mmol/L (ref 101–111)
GFR, EST AFRICAN AMERICAN: 35 mL/min — AB (ref >60)
GFR, EST NON AFRICAN AMERICAN: 30 mL/min — AB (ref >60)
Glucose, Bld: 93 mg/dL (ref 65–99)
Potassium: 3.3 mmol/L — ABNORMAL LOW (ref 3.5–5.1)
Sodium: 135 mmol/L (ref 135–145)
Total Protein: 7 g/dL (ref 6.5–8.1)

## 2014-12-14 LAB — URINE CULTURE

## 2014-12-14 LAB — MAGNESIUM: Magnesium: 2 mg/dL (ref 1.7–2.4)

## 2014-12-14 LAB — SEDIMENTATION RATE: Sed Rate: 13 mm/hr (ref 0–16)

## 2014-12-14 LAB — CK: Total CK: 41 U/L — ABNORMAL LOW (ref 49–397)

## 2014-12-14 LAB — URINALYSIS, ROUTINE W REFLEX MICROSCOPIC
GLUCOSE, UA: NEGATIVE mg/dL
KETONES UR: 15 mg/dL — AB
NITRITE: POSITIVE — AB
Specific Gravity, Urine: 1.021 (ref 1.005–1.030)
Urobilinogen, UA: 0.2 mg/dL (ref 0.0–1.0)
pH: 5.5 (ref 5.0–8.0)

## 2014-12-14 LAB — RAPID URINE DRUG SCREEN, HOSP PERFORMED
AMPHETAMINES: NOT DETECTED
BARBITURATES: NOT DETECTED
BENZODIAZEPINES: NOT DETECTED
COCAINE: NOT DETECTED
Opiates: NOT DETECTED
TETRAHYDROCANNABINOL: NOT DETECTED

## 2014-12-14 LAB — URINE MICROSCOPIC-ADD ON

## 2014-12-14 LAB — LIPASE, BLOOD: LIPASE: 124 U/L — AB (ref 22–51)

## 2014-12-14 LAB — URIC ACID: URIC ACID, SERUM: 6.6 mg/dL (ref 4.4–7.6)

## 2014-12-14 LAB — C-REACTIVE PROTEIN: CRP: 7.6 mg/dL — ABNORMAL HIGH (ref <1.0)

## 2014-12-14 MED ORDER — PROMETHAZINE HCL 25 MG/ML IJ SOLN
12.5000 mg | Freq: Four times a day (QID) | INTRAMUSCULAR | Status: DC | PRN
  Administered 2014-12-14: 12.5 mg via INTRAVENOUS
  Filled 2014-12-14 (×2): qty 1

## 2014-12-14 MED ORDER — SODIUM CHLORIDE 0.9 % IV BOLUS (SEPSIS)
1000.0000 mL | Freq: Once | INTRAVENOUS | Status: AC
  Administered 2014-12-14: 1000 mL via INTRAVENOUS

## 2014-12-14 MED ORDER — BOOST / RESOURCE BREEZE PO LIQD
1.0000 | Freq: Three times a day (TID) | ORAL | Status: DC
  Administered 2014-12-14 – 2014-12-19 (×3): 1 via ORAL

## 2014-12-14 MED ORDER — LEVOFLOXACIN IN D5W 750 MG/150ML IV SOLN: 750.0000 mg | Freq: Once | INTRAVENOUS | Status: DC

## 2014-12-14 MED ORDER — LEVOFLOXACIN IN D5W 500 MG/100ML IV SOLN: 500.0000 mg | INTRAVENOUS | Status: DC

## 2014-12-14 MED ORDER — PROMETHAZINE HCL 25 MG/ML IJ SOLN
12.5000 mg | Freq: Once | INTRAMUSCULAR | Status: AC
  Administered 2014-12-14: 12.5 mg via INTRAVENOUS
  Filled 2014-12-14: qty 1

## 2014-12-14 MED ORDER — ENOXAPARIN SODIUM 40 MG/0.4ML ~~LOC~~ SOLN
40.0000 mg | SUBCUTANEOUS | Status: DC
  Administered 2014-12-14 – 2014-12-15 (×2): 40 mg via SUBCUTANEOUS
  Filled 2014-12-14 (×2): qty 0.4

## 2014-12-14 MED ORDER — SODIUM CHLORIDE 0.9 % IV SOLN
INTRAVENOUS | Status: DC
  Administered 2014-12-14: 11:00:00 via INTRAVENOUS

## 2014-12-14 MED ORDER — PROMETHAZINE HCL 25 MG/ML IJ SOLN
25.0000 mg | Freq: Once | INTRAMUSCULAR | Status: AC
  Administered 2014-12-14: 25 mg via INTRAVENOUS
  Filled 2014-12-14: qty 1

## 2014-12-14 MED ORDER — ONDANSETRON HCL 4 MG/2ML IJ SOLN
4.0000 mg | Freq: Once | INTRAMUSCULAR | Status: AC
  Administered 2014-12-14: 4 mg via INTRAVENOUS
  Filled 2014-12-14: qty 2

## 2014-12-14 MED ORDER — DEXTROSE 5 % IV SOLN
1.0000 g | Freq: Once | INTRAVENOUS | Status: AC
  Administered 2014-12-14: 1 g via INTRAVENOUS
  Filled 2014-12-14: qty 10

## 2014-12-14 MED ORDER — POTASSIUM CHLORIDE IN NACL 20-0.9 MEQ/L-% IV SOLN
INTRAVENOUS | Status: DC
  Administered 2014-12-14 – 2014-12-15 (×3): via INTRAVENOUS
  Filled 2014-12-14 (×4): qty 1000

## 2014-12-14 NOTE — H&P (Signed)
Date: 12/14/2014               Patient Name:  Johnny Conner MRN: [id number]  DOB: 1995-12-19 Age / Sex: 19 y.o., male   PCP: No Pcp Per Patient         Medical Service: Internal Medicine Teaching Service, [place]         Attending Physician: Dr. Evette Doffing    First Contact: Kathi Simpers, MS4 Pager: [phone]  Second Contact: Dr. Heber [place] Pager: [phone]       After Hours (After 5p/  First Contact Pager: [phone]  weekends / holidays): Second Contact Pager: [phone]   Chief Complaint:  Abdominal pain  History of Present Illness: Johnny Conner is a 19 y.o. male with a h/o of recurrent UTIs, most recently two days ago who presents with n/v, abdominal pain, and increased urinary frequency with little urine output. Two days he presented to the ED with fever, chills, n/v, muscle pain, flank pain, dark urine and a headache. Urinalysis showed large blood and hemoglobin, 3-6 WBC per hpf, small leukocyte esterase and no nitrites. CT showed mild thickening of urinary bladder wall, small (0.5 and 1.7cm) bilateral renal cysts, no nephrolithiasis and no hydronephrosis. Patient felt better after IV fluids and was discharged home on TMP-SMX and ondansetron.   When patient got home from the ED and started takingTMP-SMX, he developed abdominal pain/pressure, worsening n/v unrelieved by ondasertron, and had two episodes of watery, non-bloody diarrhea. He has to urinate more frequently than usual, putting out small amounts of "black" urine in spite of drinking two large bottles of water per day. His symptoms did not improve and he decided to come to the ED today with n/v, 3/10 lower abdominal pain and pressure, and decreased urine output. No back pain, dizziness, no vision changes, no cough, no rashes, no recent medication use aside from 2 ibuprofen yesterday, no recent red and/or undercooked meat consumption. No excessive sun exposure and dehydration, as he works third shift. His last sexual  intercourse was 7-8 months ago, and he has had a negative STI screen since then. In the ED, patient was afebrile with a low BP of 90s/50s. No leukocytosis, platelets 127 down from 178 two days ago, BUN of 23 (up from 12 two days ago,) creatinine 2.87 (up from 1.22.) U/A with granular casts, now positive for both nitrites and leukocyte esterase, RBCs too numerous to count and WBC 11-20 per HPF. PVR that showed <25 cc/s of urine. Patient was started on NS at 200cc/hr.  Historically, patient had pyelonephritis at age 82, was treated with antibiotics and did not f/u with a urologist. He also had a visit to the ED in March 2016 for abdominal pain. At the time, he had no dysuria or flank pain, and was found to have large Hgb and bacteriuria, as well as an incidental finding of small bilateral renal cysts on CT. Interestingly, he notes that he had "black" urine every time he's come into the ED this year.   Meds: Current Outpatient Rx  Name  Route  Sig  Dispense  Refill  . ondansetron (ZOFRAN ODT) 4 MG disintegrating tablet      37m ODT q6 hours prn nausea/vomit   8 tablet   0   . sulfamethoxazole-trimethoprim (BACTRIM DS,SEPTRA DS) 800-160 MG per tablet   Oral   Take 1 tablet by mouth 2 (two) times daily.   14 tablet   0   . ondansetron (ZOFRAN) 4 MG  tablet   Oral   Take 1 tablet (4 mg total) by mouth every 6 (six) hours. Patient not taking: Reported on 12/12/2014   20 tablet   0     Allergies: Allergies as of 12/14/2014  . (No Known Allergies)   Past Medical History  Diagnosis Date  . Pyelonephritis 2012    treated with 3 days of unspecified abx   History reviewed. No pertinent past surgical history. History reviewed. No pertinent family history. Social History   Social History  . Marital Status: Single    Spouse Name: N/A  . Number of Children: N/A  . Years of Education: N/A   Occupational History  . Not on file.   Social History Main Topics  . Smoking status: Current  Every Day Smoker -- 0.10 packs/day for 1 years    Types: Cigarettes  . Smokeless tobacco: Never Used  . Alcohol Use: 0.0 - 0.6 oz/week    0-1 Standard drinks or equivalent per week  . Drug Use: No  . Sexual Activity: Not Currently     Comment: Last sexual experience 7-8 m ago, patient had a negative STI test since then   Other Topics Concern  . Not on file   Social History Narrative    Review of Systems: Pertinent items are noted in HPI.  Physical Exam: Blood pressure 97/59, pulse 97, temperature 97.6 F (36.4 C), temperature source Oral, resp. rate 16, height '5\' 9"'  (1.753 m), weight 65.772 kg (145 lb), SpO2 99 %. BP 97/59 mmHg  Pulse 97  Temp(Src) 97.6 F (36.4 C) (Oral)  Resp 16  Ht '5\' 9"'  (1.753 m)  Wt 65.772 kg (145 lb)  BMI 21.40 kg/m2  SpO2 99%  General Appearance:    Alert, cooperative, no distress, appears stated age  Head:    Normocephalic, without obvious abnormality, atraumatic  Eyes:    PERRL, no conjunctival pallor, EOMs intact  Throat:   Lips, mucosa, and tongue normal; teeth and gums normal  Neck:   Trachea midline, no adenopathy    Back:     no CVA tenderness  Lungs:     Clear to auscultation bilaterally, respirations unlabored  Heart:    Regular rate and rhythm, S1 and S2 normal, no murmur, rub   or gallop  Abdomen:     Soft, non-tender, mildly distended, no hyperresonnance to percussion, bowel sounds active all four quadrants  Extremities:   No cyanosis or edema. Capillary refill 1-2 seconds.   Pulses:   2+ and symmetric radial, posterior tibial and dorsalis pedis pulses  Skin:   Skin color, texture, turgor normal, no rashes or lesions. Tatoo on R forearm.   Neurologic:   CNII-XII intact grossly. 5/5 strength in all extremities, sensation to light touch intact.    Labs:  Basic Metabolic Panel:  Recent Labs Lab 12/12/14 1123 12/14/14 0738  NA 136 135  K 3.4* 3.3*  CL 102 103  CO2 20* 22  GLUCOSE 105* 93  BUN 12 23*  CREATININE 1.22 2.87*    CALCIUM 9.7 9.2    Liver Function Tests:  Recent Labs Lab 12/14/14 0738  AST 28  ALT 19  ALKPHOS 56  BILITOT 0.8  PROT 7.0  ALBUMIN 3.7    Recent Labs Lab 12/14/14 0738  LIPASE 124*   CBC:  Recent Labs Lab 12/12/14 1123 12/14/14 0738  WBC 10.6* 6.2  NEUTROABS  --  4.2  HGB 13.6 13.8  HCT 39.4 38.1*  MCV 89.7 87.0  PLT  178 127*    Cardiac Enzymes:  Recent Labs Lab 12/14/14 0738  CKTOTAL 76*    Microbiology: Results for orders placed or performed during the hospital encounter of 12/14/14  Urine culture     Status: None (Preliminary result)   Collection Time: 12/14/14  8:44 AM  Result Value Ref Range Status   Specimen Description URINE, CLEAN CATCH  Final   Special Requests NONE  Final   Culture PENDING  Incomplete   Report Status PENDING  Incomplete   Imaging: Ct Renal Stone Study  12/12/2014   CLINICAL DATA:  Vomiting for 2 days, kidney infection  EXAM: CT ABDOMEN AND PELVIS WITHOUT CONTRAST  TECHNIQUE: Multidetector CT imaging of the abdomen and pelvis was performed following the standard protocol without IV contrast.  COMPARISON:  06/30/2014  FINDINGS: Lung bases are unremarkable. Sagittal images of the spine are unremarkable.  Unenhanced liver, pancreas, spleen and adrenal glands are unremarkable. No calcified gallstones are noted within gallbladder. No aortic aneurysm. Unenhanced kidneys are symmetrical in size. No nephrolithiasis. No hydronephrosis or hydroureter.  No aortic aneurysm.  There is no pericecal inflammation.  Normal appendix partially visualized in coronal image 36. Mild levoscoliosis lumbar spine.  Bilateral distal ureter is unremarkable. No calcified ureteral calculi. There is mild thickening of urinary bladder wall. Mild cystitis cannot be excluded. Prostate gland is unremarkable.  No small bowel obstruction. No ascites or free air. No adenopathy. Again noted high-density exophytic cyst in left kidney stable in size in appearance from prior  exam measures about 5 mm. Right renal cyst again noted measures 1.7 cm.  IMPRESSION: 1. There is no nephrolithiasis.  No hydronephrosis or hydroureter. 2. Stable bilateral renal cysts. 3.  No calcified ureteral calculi. 4. Normal appendix partially visualized. 5. Mild thickening of urinary bladder wall. Cystitis cannot be excluded. Clinical correlation is necessary.   Electronically Signed   By: Lahoma Crocker M.D.   On: 12/12/2014 17:11      Assessment & Plan by Problem: Active Problems:   Acute kidney injury   Johnny Conner is a 19 y.o. male with a history of pyelonephritis admitted for n/v, abdominal pain and oliguria.   Oliguria, dark urine: likely due to partially treated UTI given the presence of bacteria, RBCs, WBCs, leuk esterase and nitrites on urinalysis and improvement of fever and flank pain on antibiotics. Urine cultures from 8/16 are inconclusive, repeat cultures from today will have limited utility as patient has been on antibiotics for two days. 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 Possible etiologies include IgA nephropathy, vasculitis. Paroxysmal nocturnal hemoglobinuria less likely given normal hemoglobin levels, though it is possible to have hemolysis without anemia if the bone marrow is able to respond adequately by increasing the reticulocyte count. Consider checking LDH and reticulocyte count if hemoglobinuria does not improve after pyelonephritis has resolved. HUS less likely, patient has not had diarrhea or exposure to undercooked meat, but will monitor platelets with a repeat CBC.  -CBC, c- and p-ANCA, ANA, UPC ratio, GC/CT, CRP, ESR, HIV and hepatitis panel pending  -d/c ceftriaxone, as it has poor tissue penetration for males; start levofloxacin 743m IV for 7 days  -recommend f/u with urology as an outpatient to evaluate for vesicoureteral reflux or obstruction that could explain recurrent urinary infections in  a young male  AKI: patient's rapid rise in serum creatinine by < 0.3 mg/dL over a 48-hour period is consistent with AKI. BAST:MHDQQIWLNLratio of 8.  Etiology likely prerenal due to volume depletion and high urine specific gravity vs ATN given hypotension and presence of granular casts on urinalysis. TMP-SMX may cause an increase in creatinine but usually does not cause a concurrent increase in BUN and decrease in eGFR. Postrenal etiology unlikely given low post void residual. -NS 150cc/hr + 20 meq KCL -check FeNa  Renal cysts: bilateral 0.5cm and 1.7 cm cysts first diagnosed in March 2016. There cysts classify as Bosniak category 1, simple benign cysts with density less than 20 Hounsfield units with no septa, calcification, or solid components. Interestingly, rupture of simple renal cysts can cause hematuria and/or flank pain, though UTI is more likely to explain patient's findings.  -no follow up required at this time  Abdominal pain, n/v: likely due to infection and antibiotic reaction  -phenergan IV -IV fluids -repeat BMP and check magnesium tomorrow am   DVT PPX - low molecular weight heparin  CODE STATUS - Full  CONSULTS PLACED - None  DISPO - Admit to Internal Medicine Teaching Service, B1 - [place], Inpatient Team   This is a Careers information officer Note.  The care of the patient was discussed with Dr. Denton Brick and the assessment and plan was formulated with their assistance.  Please see their note for official documentation of the patient encounter.   SignedKathi Simpers, MS4 Pager: [phone] 12/14/2014, 1:28 PM

## 2014-12-14 NOTE — Progress Notes (Signed)
Called to get report form ED RN

## 2014-12-14 NOTE — H&P (Signed)
Date: 12/14/2014               Patient Name:  Johnny Conner MRN: [id number]  DOB: [birth year] Age / Sex: 19 y.o., male   PCP: No Pcp Per Patient         Medical Service: Internal Medicine Teaching Service         Attending Physician: Dr. Bartholomew Crews, MD    First Contact: Kathi Simpers Pager: [phone]  Second Contact: Dr. Heber [place] Pager: [phone]       After Hours (After 5p/  First Contact Pager: [phone]  weekends / holidays): Second Contact Pager: [phone]   Chief Complaint: Diarrhea, Vomiting and Poor appetite  History of Present Illness: 18 Y O M with no significant PMG except for tobacco abuse, presented to the ED with c/o diarrhea, vomiting and poor appetite that started on Tuesday. Pt initially presented to the ED- 12/12/2014 with flank pain, urinary frequency, chills, fever and vomiting. He was diagnosed with a  UTI and started on bactrim with zofran. He went home took this medications, but says that the vomiting became worse, after starting the bactrim, also started having diarrhea also. He endorses vomiting 4-5 times a day, with occasional streaks of blood, also with diarrhea- 2 episodes, watery, non bloody, last bowel movement yesterday. Since his Ed visit 2 days ago, he has only been able to take water and gatorade, he has not been able to eat. He has not had an recurring chills or fever, flank pain has resolved, he never had dysuria. Last sexual encounter was > 6 months ago. He has some mild soreness in his lower abdomen, feels he has to void frequently but says little or sometimes no urine comes out. He says he has taken only 2 pills of OTC ibuprofen- yesterday. Patient has had prior episodes of Urinary tract infection- Kidney infection when he was 19 yrs old. He says his paternal grandmother has an issue with her kidney, he dose not know what it is.  Meds: Current Facility-Administered Medications  Medication Dose Route Frequency Provider Last Rate Last Dose  . 0.9 %   sodium chloride infusion   Intravenous STAT Sherwood Gambler, MD 200 mL/hr at 12/14/14 1112     Current Outpatient Prescriptions  Medication Sig Dispense Refill  . ondansetron (ZOFRAN ODT) 4 MG disintegrating tablet 28m ODT q6 hours prn nausea/vomit 8 tablet 0  . sulfamethoxazole-trimethoprim (BACTRIM DS,SEPTRA DS) 800-160 MG per tablet Take 1 tablet by mouth 2 (two) times daily. 14 tablet 0  . ondansetron (ZOFRAN) 4 MG tablet Take 1 tablet (4 mg total) by mouth every 6 (six) hours. (Patient not taking: Reported on 12/12/2014) 20 tablet 0    Allergies: Allergies as of 12/14/2014  . (No Known Allergies)   Past Medical History  Diagnosis Date  . Pyelonephritis 2012    treated with 3 days of unspecified abx   History reviewed. No pertinent past surgical history. History reviewed. No pertinent family history. Social History   Social History  . Marital Status: Single    Spouse Name: N/A  . Number of Children: N/A  . Years of Education: N/A   Occupational History  . Not on file.   Social History Main Topics  . Smoking status: Current Every Day Smoker -- 0.10 packs/day for 1 years    Types: Cigarettes  . Smokeless tobacco: Never Used  . Alcohol Use: 0.0 - 0.6 oz/week    0-1 Standard drinks or equivalent per week  .  Drug Use: No  . Sexual Activity: Not Currently     Comment: Last sexual experience 7-8 m ago, patient had a negative STI test since then   Other Topics Concern  . Not on file   Social History Narrative    Review of Systems: CONSTITUTIONAL- No Fever SKIN- No Rash RESPIRATORY- Some Coughing after vomiting, no SOB. CARDIAC- No chest pain. Neurologic- Had  Some headaches on initial presentation to the ED 2 days ago.  Physical Exam: Blood pressure 97/59, pulse 97, temperature 97.6 F (36.4 C), temperature source Oral, resp. rate 16, height '5\' 9"'  (1.753 m), weight 145 lb (65.772 kg), SpO2 99 %. GENERAL- alert, co-operative, appears as stated age, not in any  distress. HEENT- Atraumatic, normocephalic, PERRL, EOMI, mucus membranes appear dry. CARDIAC- RRR, no murmurs, rubs or gallops. RESP- Moving equal volumes of air, and clear to auscultation bilaterally, no wheezes or crackles. ABDOMEN- Soft, nontender, no guarding or rebound, no palpable masses or organomegaly, bowel sounds present. BACK- Normal curvature of the spine, No tenderness along the vertebrae, no CVA tenderness. NEURO- No obvious Cr N abnormality, strenght upper and lower extremities- 5/5 EXTREMITIES- pulse 2+, symmetric, no pedal edema. SKIN- Warm, dry, No rash, has tattoos. PSYCH- Normal mood and affect, appropriate thought content and speech.  Lab results: Basic Metabolic Panel:  Recent Labs  12/12/14 1123 12/14/14 0738  NA 136 135  K 3.4* 3.3*  CL 102 103  CO2 20* 22  GLUCOSE 105* 93  BUN 12 23*  CREATININE 1.22 2.87*  CALCIUM 9.7 9.2   Liver Function Tests:  Recent Labs  12/14/14 0738  AST 28  ALT 19  ALKPHOS 56  BILITOT 0.8  PROT 7.0  ALBUMIN 3.7    Recent Labs  12/14/14 0738  LIPASE 124*   No results for input(s): AMMONIA in the last 72 hours. CBC:  Recent Labs  12/12/14 1123 12/14/14 0738  WBC 10.6* 6.2  NEUTROABS  --  4.2  HGB 13.6 13.8  HCT 39.4 38.1*  MCV 89.7 87.0  PLT 178 127*   Cardiac Enzymes:  Recent Labs  12/14/14 0738  CKTOTAL 41*   Urinalysis:  Recent Labs  12/12/14 1734 12/14/14 0844  COLORURINE RED* RED*  LABSPEC 1.034* 1.021  PHURINE 5.5 5.5  GLUCOSEU NEGATIVE NEGATIVE  HGBUR LARGE* LARGE*  BILIRUBINUR MODERATE* LARGE*  KETONESUR 15* 15*  PROTEINUR >300* >300*  UROBILINOGEN 0.2 0.2  NITRITE NEGATIVE POSITIVE*  LEUKOCYTESUR SMALL* MODERATE*   Imaging results:  Ct Renal Stone Study  12/12/2014   CLINICAL DATA:  Vomiting for 2 days, kidney infection  EXAM: CT ABDOMEN AND PELVIS WITHOUT CONTRAST  TECHNIQUE: Multidetector CT imaging of the abdomen and pelvis was performed following the standard protocol  without IV contrast.  COMPARISON:  06/30/2014  FINDINGS: Lung bases are unremarkable. Sagittal images of the spine are unremarkable.  Unenhanced liver, pancreas, spleen and adrenal glands are unremarkable. No calcified gallstones are noted within gallbladder. No aortic aneurysm. Unenhanced kidneys are symmetrical in size. No nephrolithiasis. No hydronephrosis or hydroureter.  No aortic aneurysm.  There is no pericecal inflammation.  Normal appendix partially visualized in coronal image 36. Mild levoscoliosis lumbar spine.  Bilateral distal ureter is unremarkable. No calcified ureteral calculi. There is mild thickening of urinary bladder wall. Mild cystitis cannot be excluded. Prostate gland is unremarkable.  No small bowel obstruction. No ascites or free air. No adenopathy. Again noted high-density exophytic cyst in left kidney stable in size in appearance from prior exam measures about  5 mm. Right renal cyst again noted measures 1.7 cm.  IMPRESSION: 1. There is no nephrolithiasis.  No hydronephrosis or hydroureter. 2. Stable bilateral renal cysts. 3.  No calcified ureteral calculi. 4. Normal appendix partially visualized. 5. Mild thickening of urinary bladder wall. Cystitis cannot be excluded. Clinical correlation is necessary.   Electronically Signed   By: Lahoma Crocker M.D.   On: 12/12/2014 17:11    Other results: EKG: None.  Assessment & Plan by Problem: Principal Problem:   Acute kidney injury Active Problems:   Vomiting   UTI (lower urinary tract infection)  Acute Kidney injury- Most likely prerenal etiology, Likely due to dehydration, from poor PO intake, vomiting and diarrhea, with dry mucus membranes, and hypotension- Bp-lowest documented- 96/53 . BUN- 23, Cr- 2.87. BUN/Cr- 8, suggestive of intrinsic renal insult. Pt has gotten 2L total of N/s in the Ed. Doubt increased Cr is due to Bactrim use, as BUN is also increased, and GFR is also decreased, which would not be expected with bactrim affecting  the kidneys. - Admit to med-surg - Cont IVF at 150cc/hr for 1 day- N/s + 20 meq KCL, was aleady place don 200cc /hr of IVF in the ED. - BMet Am- Cr and K - Mag - Doubt helpfulness of FeNa calculation now, can check if Cr fails to improve with hydration. - CBC am, follow Platelets - CK- WNL- 41  UTI- in a male patient. WBC- 6.2 today down from 10.6 - 12/12/2014. Was treated with Bactrim, which he started on Tuesday- when he was on admission. ?cause of UTI. He is otherwise symptomatically better, except for the poor tolerance of bactrim. But UA also noted large hgb on three different occasion. CT done-06/30/2014 notes Small bilateral renal cysts, including a Nonspecific minimally hypodense focus originating at the lower pole of the left kidney. Differentials also includes a prostatitis.  - IV levaquin-  71m daily, will D/c ceftriazone as the quinolones have better tissue penetration in male patients than male patients, and would cover an underlying prostatitis, though the duration of treatment for that would be longer. - Would recommend 7 days of treatment, pt has already gotten 2 days of bactrim. - Urine Cultures ordered today, pending, but pt already on antibiotics. - Phenergan IV  Hematuria- Likely due to UTI or renal cysts. - Follow up after discharge and clearance of infection to document resolution of hemoglobinuria - Also consider follow up on discharge with a Urologist for UTI in a male and presence of significant hematuria if this fails to resolve. - Pt also has proteinuria which is unusual with a UTI, but without RBC or WBC casts, that would suggest a nephritis. This will require follow up, if Cr fails to improve with hydration.   DVT ppx- Lovenox.  Addendum- Considering patients young age, lack of follow up, persistent hematuria, proteinuria, will work up more aggressively for Glomerulonephritis and also IgA nephropathy considering recurrence- due to Vasculitis or  Infection with  Acute hepatitis panel, ANCA, ESR, CRP, Urine protein- cr Ratio, ANA, C3, C4, antistreptolysin O. Also considering young age- HIV, and GC/Chlamydia probe.  Dispo: Disposition is deferred at this time, awaiting improvement of current medical problems. Anticipated discharge in approximately 1-2 day(s).   The patient does not have a current PCP (No Pcp Per Patient) and [facility].  The patient does not have transportation limitations that hinder transportation to clinic appointments.  Signed: EBethena Roys MD 12/14/2014, 1:06 PM

## 2014-12-14 NOTE — Progress Notes (Signed)
ANTIBIOTIC CONSULT NOTE - INITIAL  Pharmacy Consult for levaquin Indication: UTI  No Known Allergies  Patient Measurements: Height:  (175.3 cm) Weight: 145 lb (65.772 kg) IBW/kg (Calculated) : 70.7  Vital Signs: Temp: 97.6 F (36.4 C) (08/18 0728) Temp Source: Oral (08/18 0728) BP: 102/64 mmHg (08/18 1430) Pulse Rate: 66 (08/18 1430) Intake/Output from previous day:   Intake/Output from this shift: Total I/O In: 2850 [I.V.:2850] Out: -   Labs:  Recent Labs  12/12/14 1123 12/14/14 0738  WBC 10.6* 6.2  HGB 13.6 13.8  PLT 178 127*  CREATININE 1.22 2.87*   Estimated Creatinine Clearance: 38.2 mL/min (by C-G formula based on Cr of 2.87). No results for input(s): VANCOTROUGH, VANCOPEAK, VANCORANDOM, GENTTROUGH, GENTPEAK, GENTRANDOM, TOBRATROUGH, TOBRAPEAK, TOBRARND, AMIKACINPEAK, AMIKACINTROU, AMIKACIN in the last 72 hours.   Microbiology: Recent Results (from the past 720 hour(s))  Urine culture     Status: None   Collection Time: 12/12/14  5:32 PM  Result Value Ref Range Status   Specimen Description URINE, CLEAN CATCH  Final   Special Requests NONE  Final   Culture MULTIPLE SPECIES PRESENT, SUGGEST RECOLLECTION  Final   Report Status 12/14/2014 FINAL  Final  Urine culture     Status: None (Preliminary result)   Collection Time: 12/14/14  8:44 AM  Result Value Ref Range Status   Specimen Description URINE, CLEAN CATCH  Final   Special Requests NONE  Final   Culture PENDING  Incomplete   Report Status PENDING  Incomplete    Medical History: Past Medical History  Diagnosis Date  . Pyelonephritis 2012    treated with 3 days of unspecified abx    Medications:  Anti-infectives    Start     Dose/Rate Route Frequency Ordered Stop   12/16/14 1600  levofloxacin (LEVAQUIN) IVPB 500 mg     500 mg 100 mL/hr over 60 Minutes Intravenous Every 48 hours 12/14/14 1538     12/14/14 1600  levofloxacin (LEVAQUIN) IVPB 750 mg     750 mg 100 mL/hr over 90 Minutes  Intravenous  Once 12/14/14 1538     12/14/14 0930  cefTRIAXone (ROCEPHIN) 1 g in dextrose 5 % 50 mL IVPB     1 g 100 mL/hr over 30 Minutes Intravenous  Once 12/14/14 0925 12/14/14 1032     Assessment: 19 yom presented to the ED with diarrhea, vomiting and poor appetite. To start levaquin for UTI. Of note pt is in AKI with SCr of 2.87. He is afebrile and WBC is WNL.   Levaquin 8/18>> CTX 1 8/18>>  Goal of Therapy:  Eradication of infection  Plan:  - Levaquin  IV x 1 then  IV Q48H - F/u renal fxn, C&S, clinical status  Cozetta Seif, Drake Leach 12/14/2014,3:40 PM

## 2014-12-14 NOTE — ED Provider Notes (Signed)
CSN: [id number]     Arrival date & time 12/14/14  0720 History   First MD Initiated Contact with Patient 12/14/14 [phone]     Chief Complaint  Patient presents with  . Nausea  . Urinary Tract Infection  . Urinary Retention     (Consider location/radiation/quality/duration/timing/severity/associated sxs/prior Treatment) HPI  19 year old male presents with continued nausea and vomiting since being seen 2 days ago. 3 days ago he developed nausea, vomiting, and mild abdominal pain. Patient was diagnosed with a UTI and discharged on Bactrim and Zofran. He states that he has increased abdominal cramping after Bactrim that then resolves. His nausea is not helped by Zofran and he is still vomiting. He was able to keep down small amount of water, Gatorade, and crampy juice this morning. He denies any dysuria at any time but states that he's been having difficulty urinating and is urinating less. Patient had a CT scan 2 days ago that was unremarkable except for a cyst on its kidney. No evidence of nephrolithiasis or ureteral calculus. Patient denies any fevers. No back pain.  Past Medical History  Diagnosis Date  . Pyelonephritis    History reviewed. No pertinent past surgical history. History reviewed. No pertinent family history. Social History  Substance Use Topics  . Smoking status: Current Every Day Smoker  . Smokeless tobacco: None  . Alcohol Use: Yes    Review of Systems  Constitutional: Positive for diaphoresis. Negative for fever.  Gastrointestinal: Positive for nausea, vomiting, abdominal pain and diarrhea (only since the antibiotic).  Genitourinary: Positive for decreased urine volume and difficulty urinating. Negative for dysuria.  Musculoskeletal: Negative for back pain.  All other systems reviewed and are negative.     Allergies  Review of patient's allergies indicates no known allergies.  Home Medications   Prior to Admission medications   Medication Sig Start Date End  Date Taking? Authorizing Provider  ondansetron (ZOFRAN ODT) 4 MG disintegrating tablet 4mg  ODT q6 hours prn nausea/vomit 12/12/14   Felicie Morn, NP  ondansetron (ZOFRAN) 4 MG tablet Take 1 tablet (4 mg total) by mouth every 6 (six) hours. Patient not taking: Reported on 12/12/2014 06/30/14   Gilda Crease, MD  sulfamethoxazole-trimethoprim (BACTRIM DS,SEPTRA DS) 800-160 MG per tablet Take 1 tablet by mouth 2 (two) times daily. 12/12/14 12/19/14  Felicie Morn, NP   BP 102/67 mmHg  Pulse 62  Temp(Src) 97.6 F (36.4 C) (Oral)  Resp 16  Ht 5\' 9"  (1.753 m)  Wt 145 lb (65.772 kg)  BMI 21.40 kg/m2  SpO2 100% Physical Exam  Constitutional: He is oriented to person, place, and time. He appears well-developed and well-nourished. No distress.  HENT:  Head: Normocephalic and atraumatic.  Right Ear: External ear normal.  Left Ear: External ear normal.  Nose: Nose normal.  Eyes: Right eye exhibits no discharge. Left eye exhibits no discharge.  Neck: Neck supple.  Cardiovascular: Normal rate, regular rhythm, normal heart sounds and intact distal pulses.   Pulmonary/Chest: Effort normal and breath sounds normal.  Abdominal: Soft. He exhibits no distension. There is no tenderness. There is no CVA tenderness.  Musculoskeletal: He exhibits no edema.  Neurological: He is alert and oriented to person, place, and time.  Skin: Skin is warm and dry. He is not diaphoretic.  Nursing note and vitals reviewed.   ED Course  Procedures (including critical care time) Labs Review Labs Reviewed  COMPREHENSIVE METABOLIC PANEL - Abnormal; Notable for the following:    Potassium 3.3 (*)  BUN 23 (*)    Creatinine, Ser 2.87 (*)    GFR calc non Af Amer 30 (*)    GFR calc Af Amer 35 (*)    All other components within normal limits  LIPASE, BLOOD - Abnormal; Notable for the following:    Lipase 124 (*)    All other components within normal limits  CBC WITH DIFFERENTIAL/PLATELET - Abnormal; Notable for the  following:    HCT 38.1 (*)    MCHC 36.2 (*)    Platelets 127 (*)    Monocytes Relative 15 (*)    All other components within normal limits  URINALYSIS, ROUTINE W REFLEX MICROSCOPIC (NOT AT [facility]) - Abnormal; Notable for the following:    Color, Urine RED (*)    APPearance TURBID (*)    Hgb urine dipstick LARGE (*)    Bilirubin Urine LARGE (*)    Ketones, ur 15 (*)    Protein, ur >300 (*)    Nitrite POSITIVE (*)    Leukocytes, UA MODERATE (*)    All other components within normal limits  CK - Abnormal; Notable for the following:    Total CK 41 (*)    All other components within normal limits  URINE MICROSCOPIC-ADD ON - Abnormal; Notable for the following:    Squamous Epithelial / LPF FEW (*)    Bacteria, UA FEW (*)    Casts GRANULAR CAST (*)    All other components within normal limits  URINE CULTURE  URIC ACID  MAGNESIUM  MICROALBUMIN / CREATININE URINE RATIO  ANTINUCLEAR ANTIBODIES, IFA  SEDIMENTATION RATE  C-REACTIVE PROTEIN  MPO/PR-3 (ANCA) ANTIBODIES  URINE RAPID DRUG SCREEN, HOSP PERFORMED  HEPATITIS PANEL, ACUTE  HIV ANTIBODY (ROUTINE TESTING)  ANTISTREPTOLYSIN O TITER  GC/CHLAMYDIA PROBE AMP ([place]) NOT AT [facility]    Imaging Review Ct Renal Stone Study  12/12/2014   CLINICAL DATA:  Vomiting for 2 days, kidney infection  EXAM: CT ABDOMEN AND PELVIS WITHOUT CONTRAST  TECHNIQUE: Multidetector CT imaging of the abdomen and pelvis was performed following the standard protocol without IV contrast.  COMPARISON:  06/30/2014  FINDINGS: Lung bases are unremarkable. Sagittal images of the spine are unremarkable.  Unenhanced liver, pancreas, spleen and adrenal glands are unremarkable. No calcified gallstones are noted within gallbladder. No aortic aneurysm. Unenhanced kidneys are symmetrical in size. No nephrolithiasis. No hydronephrosis or hydroureter.  No aortic aneurysm.  There is no pericecal inflammation.  Normal appendix partially visualized in coronal image 36. Mild  levoscoliosis lumbar spine.  Bilateral distal ureter is unremarkable. No calcified ureteral calculi. There is mild thickening of urinary bladder wall. Mild cystitis cannot be excluded. Prostate gland is unremarkable.  No small bowel obstruction. No ascites or free air. No adenopathy. Again noted high-density exophytic cyst in left kidney stable in size in appearance from prior exam measures about 5 mm. Right renal cyst again noted measures 1.7 cm.  IMPRESSION: 1. There is no nephrolithiasis.  No hydronephrosis or hydroureter. 2. Stable bilateral renal cysts. 3.  No calcified ureteral calculi. 4. Normal appendix partially visualized. 5. Mild thickening of urinary bladder wall. Cystitis cannot be excluded. Clinical correlation is necessary.   Electronically Signed   By: Natasha Mead M.D.   On: 12/12/2014 17:11   I have personally reviewed and evaluated these images and lab results as part of my medical decision-making.   EKG Interpretation None      MDM   Final diagnoses:  Acute kidney injury  UTI (lower urinary tract  infection)    Patient's creatinine is significantly elevated from just 2 days ago. Does appear to have a UTI. Given worsening of symptoms while on Bactrim I will broaden his antibiotics and he will be given IV Rocephin. Given his acute kidney injury will need fluid resuscitation and admission for further workup. Admit to the internal medicine teaching service. No evidence of rhabdomyolysis. Otherwise stable for floor admission.    Pricilla Loveless, MD 12/14/14 [phone]

## 2014-12-14 NOTE — ED Notes (Signed)
Post void residual <25 cc urine.

## 2014-12-14 NOTE — ED Notes (Signed)
Pt to ED for evaluation of uncontrolled vomiting with zofran after being seen here Tuesday and was diagnosed with UTI and "cysts in my kidneys." Pt denies abdominal pain at this time but states the antibiotics make his abdomen hurt. Pt also reports a decrease in urination since Tuesday. Pt is a/o x4. NAD at this time.

## 2014-12-15 DIAGNOSIS — R110 Nausea: Secondary | ICD-10-CM | POA: Diagnosis not present

## 2014-12-15 DIAGNOSIS — N179 Acute kidney failure, unspecified: Secondary | ICD-10-CM | POA: Diagnosis not present

## 2014-12-15 DIAGNOSIS — R809 Proteinuria, unspecified: Secondary | ICD-10-CM | POA: Diagnosis not present

## 2014-12-15 DIAGNOSIS — R319 Hematuria, unspecified: Secondary | ICD-10-CM | POA: Diagnosis not present

## 2014-12-15 DIAGNOSIS — B9689 Other specified bacterial agents as the cause of diseases classified elsewhere: Secondary | ICD-10-CM | POA: Diagnosis not present

## 2014-12-15 DIAGNOSIS — N390 Urinary tract infection, site not specified: Secondary | ICD-10-CM | POA: Diagnosis not present

## 2014-12-15 LAB — C4 COMPLEMENT: COMPLEMENT C4, BODY FLUID: 20 mg/dL (ref 14–44)

## 2014-12-15 LAB — BASIC METABOLIC PANEL
ANION GAP: 9 (ref 5–15)
Anion gap: 6 (ref 5–15)
BUN: 25 mg/dL — ABNORMAL HIGH (ref 6–20)
BUN: 23 mg/dL — AB (ref 6–20)
CALCIUM: 8.2 mg/dL — AB (ref 8.9–10.3)
CHLORIDE: 113 mmol/L — AB (ref 101–111)
CO2: 18 mmol/L — ABNORMAL LOW (ref 22–32)
CO2: 20 mmol/L — ABNORMAL LOW (ref 22–32)
CREATININE: 3.36 mg/dL — AB (ref 0.61–1.24)
Calcium: 7.9 mg/dL — ABNORMAL LOW (ref 8.9–10.3)
Chloride: 111 mmol/L (ref 101–111)
Creatinine, Ser: 3.31 mg/dL — ABNORMAL HIGH (ref 0.61–1.24)
GFR calc Af Amer: 29 mL/min — ABNORMAL LOW (ref >60)
GFR, EST AFRICAN AMERICAN: 29 mL/min — AB (ref >60)
GFR, EST NON AFRICAN AMERICAN: 25 mL/min — AB (ref >60)
GFR, EST NON AFRICAN AMERICAN: 25 mL/min — AB (ref >60)
GLUCOSE: 84 mg/dL (ref 65–99)
GLUCOSE: 91 mg/dL (ref 65–99)
POTASSIUM: 4.1 mmol/L (ref 3.5–5.1)
POTASSIUM: 3.6 mmol/L (ref 3.5–5.1)
SODIUM: 138 mmol/L (ref 135–145)
Sodium: 139 mmol/L (ref 135–145)

## 2014-12-15 LAB — MICROALBUMIN / CREATININE URINE RATIO
CREATININE, UR: 183.8 mg/dL
MICROALB/CREAT RATIO: 1076 mg/g{creat} — AB (ref 0.0–30.0)
Microalb, Ur: 1977.6 ug/mL — ABNORMAL HIGH

## 2014-12-15 LAB — ANTISTREPTOLYSIN O TITER: ASO: 48 [IU]/mL (ref 0.0–200.0)

## 2014-12-15 LAB — PROTEIN / CREATININE RATIO, URINE
CREATININE, URINE: 122.74 mg/dL
PROTEIN CREATININE RATIO: 0.95 mg/mg{creat} — AB (ref 0.00–0.15)
TOTAL PROTEIN, URINE: 116 mg/dL

## 2014-12-15 LAB — ANTINUCLEAR ANTIBODIES, IFA: ANTINUCLEAR ANTIBODIES, IFA: NEGATIVE

## 2014-12-15 LAB — CBC
HCT: 31.7 % — ABNORMAL LOW (ref 39.0–52.0)
HEMOGLOBIN: 11 g/dL — AB (ref 13.0–17.0)
MCH: 31 pg (ref 26.0–34.0)
MCHC: 34.7 g/dL (ref 30.0–36.0)
MCV: 89.3 fL (ref 78.0–100.0)
Platelets: 115 10*3/uL — ABNORMAL LOW (ref 150–400)
RBC: 3.55 MIL/uL — AB (ref 4.22–5.81)
RDW: 12.3 % (ref 11.5–15.5)
WBC: 5.3 10*3/uL (ref 4.0–10.5)

## 2014-12-15 LAB — URINE CULTURE: CULTURE: NO GROWTH

## 2014-12-15 LAB — HIV ANTIBODY (ROUTINE TESTING W REFLEX): HIV SCREEN 4TH GENERATION: NONREACTIVE

## 2014-12-15 LAB — HEPATITIS PANEL, ACUTE
HCV Ab: <0.1 s/co ratio (ref 0.0–0.9)
HEP B S AG: NEGATIVE
Hep A IgM: NEGATIVE
Hep B C IgM: NEGATIVE

## 2014-12-15 LAB — C3 COMPLEMENT: C3 Complement: 81 mg/dL — ABNORMAL LOW (ref 82–167)

## 2014-12-15 LAB — MPO/PR-3 (ANCA) ANTIBODIES
ANCA Proteinase 3: <3.5 U/mL (ref 0.0–3.5)
Myeloperoxidase Abs: <9 U/mL (ref 0.0–9.0)

## 2014-12-15 MED ORDER — SODIUM CHLORIDE 0.9 % IV BOLUS (SEPSIS)
1000.0000 mL | Freq: Once | INTRAVENOUS | Status: AC
  Administered 2014-12-15: 1000 mL via INTRAVENOUS

## 2014-12-15 MED ORDER — LEVOFLOXACIN 750 MG PO TABS
750.0000 mg | ORAL_TABLET | ORAL | Status: DC
  Administered 2014-12-15 – 2014-12-17 (×2): 750 mg via ORAL
  Filled 2014-12-15 (×3): qty 1

## 2014-12-15 MED ORDER — PROMETHAZINE HCL 25 MG/ML IJ SOLN
12.5000 mg | Freq: Four times a day (QID) | INTRAMUSCULAR | Status: DC | PRN
  Administered 2014-12-15 – 2014-12-17 (×3): 25 mg via INTRAVENOUS
  Filled 2014-12-15 (×6): qty 1

## 2014-12-15 NOTE — Progress Notes (Signed)
Subjective:    Patient feels better this morning. His urine is lighter than yesterday but still not at baseline. No fever/chills. No abdominal pain, no dysuria.    Objective:    Vital Signs:   Temp:  [98.2 F (36.8 C)-99 F (37.2 C)] 98.2 F (36.8 C) (08/19 0448) Pulse Rate:  [58-97] 71 (08/19 0448) Resp:  [16-18] 18 (08/19 0448) BP: (95-114)/(55-68) 114/60 mmHg (08/19 0448) SpO2:  [97 %-100 %] 100 % (08/19 0448) Weight:  [68.72 kg (151 lb 8 oz)] 68.72 kg (151 lb 8 oz) (08/18 1709) Last BM Date: 12/15/14  24-hour weight change: Weight change:   Intake/Output:   Intake/Output Summary (Last 24 hours) at 12/15/14 1035 Last data filed at 12/15/14 0843  Gross per 24 hour  Intake 2027.5 ml  Output    500 ml  Net 1527.5 ml      Physical Exam: General: Well-developed, well-nourished, in no acute distress; alert, appropriate and cooperative throughout examination.  Lungs:  Normal wob, CTAB   Heart: RRR. S1 and S2 normal without gallop, murmur, or rubs.  Abdomen:  BS normoactive. Soft, Nondistended, non-tender.    Extremities: No pretibial edema.     Labs:  Basic Metabolic Panel:  Recent Labs Lab 12/12/14 1123 12/14/14 0738 12/14/14 1601 12/15/14 0529  NA 136 135  --  138  K 3.4* 3.3*  --  4.1  CL 102 103  --  111  CO2 20* 22  --  18*  GLUCOSE 105* 93  --  84  BUN 12 23*  --  25*  CREATININE 1.22 2.87*  --  3.31*  CALCIUM 9.7 9.2  --  8.2*  MG  --   --  2.0  --     Liver Function Tests:  Recent Labs Lab 12/14/14 0738  AST 28  ALT 19  ALKPHOS 56  BILITOT 0.8  PROT 7.0  ALBUMIN 3.7    Recent Labs Lab 12/14/14 0738  LIPASE 124*   No results for input(s): AMMONIA in the last 168 hours.  CBC:  Recent Labs Lab 12/12/14 1123 12/14/14 0738 12/15/14 0529  WBC 10.6* 6.2 5.3  NEUTROABS  --  4.2  --   HGB 13.6 13.8 11.0*  HCT 39.4 38.1* 31.7*  MCV 89.7 87.0 89.3  PLT 178 127* 115*    Cardiac Enzymes:  Recent Labs Lab 12/14/14 0738    CKTOTAL 41*    Microbiology: Results for orders placed or performed during the hospital encounter of 12/14/14  Urine culture     Status: None   Collection Time: 12/14/14  8:44 AM  Result Value Ref Range Status   Specimen Description URINE, CLEAN CATCH  Final   Special Requests NONE  Final   Culture NO GROWTH 1 DAY  Final   Report Status 12/15/2014 FINAL  Final   C3 81 (low) C4 20   Lab Results  Component Value Date   HEPBSAG Negative 12/14/2014    HIV antibody negative    Medications:    Infusions:    Scheduled Medications: . enoxaparin (LOVENOX) injection  40 mg Subcutaneous Q24H  . feeding supplement  1 Container Oral TID BM  . levofloxacin (LEVAQUIN) IV  750 mg Intravenous Once   Followed by  . [START ON 12/16/2014] levofloxacin (LEVAQUIN) IV  500 mg Intravenous Q48H    PRN Medications: promethazine   Assessment/ Plan:   [name] is a 19 y.o. male with a history of pyelonephritis and persistent proteinuria and  hematuria admitted for UTI and AKI.   Acute kidney injury: worsening today with creatinine 3.31 (up from 2.87 yesterday) in spite of IV fluids. Of note, patient has gotten >3L of fluids in and only 300 mL out. 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 Prerenal azotemia would have improved with IV fluids. Postrenal etiology less likely given low post void residual yesterday and no hydronephrosis on CT scan, but will recheck post-void residual and/or in and out Foley if given patient's low UOP. -check urine eosinophils to r/o AIN -urine protein to creatinine ratio, ANCA antibodies pending  -d/c'ed IV fluids as patient has good PO intake  -nephrology following, appreciate recommendations   UTI:  U/A on 8/16 and on admission is consistent with a UTI. Urine culture from 8/16 shows multiple species  present. -continue levofloxacin PO  -f/u 8/18 urine cultures, though they may have limited utility as patient had been on antibiotics for two days  Hypokalemia: resolved, potassium wnl  Abdominal pain, n/v: resolved, patient now has good PO intake  DVT PPx: LMWH  Dispo: Disposition is deferred at this time, awaiting improvement of current medical problems.   The patient does have a current PCP (No Pcp Per Patient) and does not know need an [facility] hospital follow-up appointment after discharge.  The patient does not have transportation limitations that hinder transportation to clinic appointments.  Length of Stay:  1 day(s)   This is a Psychologist, occupational Note.  The care of the patient was discussed with Dr. Mikey Bussing and the assessment and plan formulated with their assistance.  Please see their attached note for official documentation of the daily encounter.   Cardell Peach, MS4 Pager: [phone] (7AM-5PM) 12/15/2014, 10:35 AM

## 2014-12-15 NOTE — Consult Note (Signed)
HPI: Johnny Conner who is a 19 y.o. male with no known past medical history who presented with a 2 day history of diarrhea, nausea, vomiting, and poor appetite yesterday and was found to have acute kidney injury. Nephrology was consulted for further evaluation of his worsening renal dysfunction.  3 days ago, he presented to urgent care with symptoms of lower back pain, abdominal pain, nausea, vomiting, poor appetite and was told that he had a kidney infection needed to go to the emergency department. In the ED, creatinine was 1.2, his urinalysis was notable for greater than 300 protein, small leukocytes, large hemoglobin, bacteriuria. Abdominal CT was notable for bilateral renal cysts, mild bladder thickening but no hydronephrosis. He was treated symptomatically and discharged on Bactrim with a diagnosis of cystitis. He reports taking 1-2 doses of the medication before developing worsening of his symptoms after which his family brought him back to emergency department yesterday where he was found to have creatinine 2.9, BUN 23, and urinalysis now with positive nitrites, moderate WBCs, and persistent hemoglobinuria and proteinuria. He was given IV normal saline that was found to have creatinine 3.3, BUN 25 today though C3, C4, ANA, ANCA, ASO titers, HIV, hepatitis B and C were within normal limits.   He reports his only other episode of hematuria was associated with a kidney infection several years ago which was treated for 3 days as an outpatient with antibiotic therapy denies any hemoptysis, ear infections, chest pain, shortness of breath, abdominal pain, dysuria, diarrhea, weight loss, rash. His family history is notable for a maternal grandfather struggled with "kidney issues." He otherwise takes no medications, denies any illicit substance use.   Past Medical History  Diagnosis Date  . Pyelonephritis 2012    treated with 3 days of unspecified abx   History reviewed. No pertinent past surgical  history. Social History:  reports that he has been smoking Cigarettes.  He has a .1 pack-year smoking history. He has never used smokeless tobacco. He reports that he drinks alcohol. He reports that he does not use illicit drugs. Allergies: No Known Allergies History reviewed. No pertinent family history.  Medications:  Scheduled: . enoxaparin (LOVENOX) injection  40 mg Subcutaneous Q24H  . feeding supplement  1 Container Oral TID BM  . levofloxacin  750 mg Oral Q48H    ROS: As noted in the history of present illness Blood pressure 114/60, pulse 71, temperature 98.2 F (36.8 C), temperature source Oral, resp. rate 18, height '5\' 9"'  (1.753 m), weight 151 lb 8 oz (68.72 kg), SpO2 100 %.  General: Young Caucasian male, resting in bed, NAD HEENT: PERRL, EOMI, no scleral icterus, oropharynx clear Cardiac: RRR, no rubs, murmurs or gallops Pulm: clear to auscultation bilaterally, no wheezes, rales, or rhonchi Abd: soft, nontender, nondistended, BS present Ext: warm and well perfused, no pedal edema  Skin: No rashes or lesions noted Neuro: responds to questions appropriately; moving all extremities freely   Results for orders placed or performed during the hospital encounter of 12/14/14 (from the past 48 hour(s))  Comprehensive metabolic panel     Status: Abnormal   Collection Time: 12/14/14  7:38 AM  Result Value Ref Range   Sodium 135 135 - 145 mmol/L   Potassium 3.3 (L) 3.5 - 5.1 mmol/L   Chloride 103 101 - 111 mmol/L   CO2 22 22 - 32 mmol/L   Glucose, Bld 93 65 - 99 mg/dL   BUN 23 (H) 6 - 20 mg/dL   Creatinine,  Ser 2.87 (H) 0.61 - 1.24 mg/dL    Comment: REPEATED TO VERIFY   Calcium 9.2 8.9 - 10.3 mg/dL   Total Protein 7.0 6.5 - 8.1 g/dL   Albumin 3.7 3.5 - 5.0 g/dL   AST 28 15 - 41 U/L   ALT 19 17 - 63 U/L   Alkaline Phosphatase 56 38 - 126 U/L   Total Bilirubin 0.8 0.3 - 1.2 mg/dL   GFR calc non Af Amer 30 (L) >60 mL/min   GFR calc Af Amer 35 (L) >60 mL/min    Comment:  (NOTE) The eGFR has been calculated using the CKD EPI equation. This calculation has not been validated in all clinical situations. eGFR's persistently <60 mL/min signify possible Chronic Kidney Disease.    Anion gap 10 5 - 15  Lipase, blood     Status: Abnormal   Collection Time: 12/14/14  7:38 AM  Result Value Ref Range   Lipase 124 (H) 22 - 51 U/L  CBC with Differential     Status: Abnormal   Collection Time: 12/14/14  7:38 AM  Result Value Ref Range   WBC 6.2 4.0 - 10.5 K/uL   RBC 4.38 4.22 - 5.81 MIL/uL   Hemoglobin 13.8 13.0 - 17.0 g/dL   HCT 38.1 (L) 39.0 - 52.0 %   MCV 87.0 78.0 - 100.0 fL   MCH 31.5 26.0 - 34.0 pg   MCHC 36.2 (H) 30.0 - 36.0 g/dL   RDW 11.9 11.5 - 15.5 %   Platelets 127 (L) 150 - 400 K/uL   Neutrophils Relative % 68 43 - 77 %   Neutro Abs 4.2 1.7 - 7.7 K/uL   Lymphocytes Relative 16 12 - 46 %   Lymphs Abs 1.0 0.7 - 4.0 K/uL   Monocytes Relative 15 (H) 3 - 12 %   Monocytes Absolute 0.9 0.1 - 1.0 K/uL   Eosinophils Relative 1 0 - 5 %   Eosinophils Absolute 0.1 0.0 - 0.7 K/uL   Basophils Relative 0 0 - 1 %   Basophils Absolute 0.0 0.0 - 0.1 K/uL  CK     Status: Abnormal   Collection Time: 12/14/14  7:38 AM  Result Value Ref Range   Total CK 41 (L) 49 - 397 U/L  Urinalysis, Routine w reflex microscopic (not at [facility])     Status: Abnormal   Collection Time: 12/14/14  8:44 AM  Result Value Ref Range   Color, Urine RED (A) YELLOW    Comment: BIOCHEMICALS MAY BE AFFECTED BY COLOR   APPearance TURBID (A) CLEAR   Specific Gravity, Urine 1.021 1.005 - 1.030   pH 5.5 5.0 - 8.0   Glucose, UA NEGATIVE NEGATIVE mg/dL   Hgb urine dipstick LARGE (A) NEGATIVE   Bilirubin Urine LARGE (A) NEGATIVE   Ketones, ur 15 (A) NEGATIVE mg/dL   Protein, ur >300 (A) NEGATIVE mg/dL   Urobilinogen, UA 0.2 0.0 - 1.0 mg/dL   Nitrite POSITIVE (A) NEGATIVE   Leukocytes, UA MODERATE (A) NEGATIVE  Urine culture     Status: None   Collection Time: 12/14/14  8:44 AM  Result  Value Ref Range   Specimen Description URINE, CLEAN CATCH    Special Requests NONE    Culture NO GROWTH 1 DAY    Report Status 12/15/2014 FINAL   Urine microscopic-add on     Status: Abnormal   Collection Time: 12/14/14  8:44 AM  Result Value Ref Range   Squamous Epithelial / LPF FEW (A)  RARE   WBC, UA 11-20 <3 WBC/hpf   RBC / HPF TOO NUMEROUS TO COUNT <3 RBC/hpf   Bacteria, UA FEW (A) RARE   Casts GRANULAR CAST (A) NEGATIVE   Urine-Other AMORPHOUS URATES/PHOSPHATES   Microalbumin / creatinine urine ratio     Status: Abnormal   Collection Time: 12/14/14  8:44 AM  Result Value Ref Range   Microalb, Ur 1977.6 (H) Not Estab. ug/mL    Comment: (NOTE) Results confirmed on dilution.    Microalb Creat Ratio 1076.0 (H) 0.0 - 30.0 mg/g creat    Comment: (NOTE) Performed At: [facility] [place], [place] [id number] Lindon Romp MD [id number]    Creatinine, Urine 183.8 Not Estab. mg/dL  Urine rapid drug screen (hosp performed)     Status: None   Collection Time: 12/14/14  8:44 AM  Result Value Ref Range   Opiates NONE DETECTED NONE DETECTED   Cocaine NONE DETECTED NONE DETECTED   Benzodiazepines NONE DETECTED NONE DETECTED   Amphetamines NONE DETECTED NONE DETECTED   Tetrahydrocannabinol NONE DETECTED NONE DETECTED   Barbiturates NONE DETECTED NONE DETECTED    Comment:        DRUG SCREEN FOR MEDICAL PURPOSES ONLY.  IF CONFIRMATION IS NEEDED FOR ANY PURPOSE, NOTIFY LAB WITHIN 5 DAYS.        LOWEST DETECTABLE LIMITS FOR URINE DRUG SCREEN Drug Class       Cutoff (ng/mL) Amphetamine      1000 Barbiturate      200 Benzodiazepine   099 Tricyclics       833 Opiates          300 Cocaine          300 THC              50   Uric acid     Status: None   Collection Time: 12/14/14  4:01 PM  Result Value Ref Range   Uric Acid, Serum 6.6 4.4 - 7.6 mg/dL  Magnesium     Status: None   Collection Time: 12/14/14  4:01 PM  Result Value Ref Range   Magnesium  2.0 1.7 - 2.4 mg/dL  Sedimentation rate     Status: None   Collection Time: 12/14/14  4:01 PM  Result Value Ref Range   Sed Rate 13 0 - 16 mm/hr  C-reactive protein     Status: Abnormal   Collection Time: 12/14/14  4:01 PM  Result Value Ref Range   CRP 7.6 (H) <1.0 mg/dL  Hepatitis panel, acute     Status: None   Collection Time: 12/14/14  4:01 PM  Result Value Ref Range   Hepatitis B Surface Ag Negative Negative   HCV Ab <0.1 0.0 - 0.9 s/co ratio    Comment: (NOTE)                                  Negative:     < 0.8                             Indeterminate: 0.8 - 0.9                                  Positive:     > 0.9 The CDC recommends that a positive HCV antibody result  be followed up with a HCV Nucleic Acid Amplification test (756433). Performed At: [facility] [place], [place] [id number] Lindon Romp MD [id number]    Hep A IgM Negative Negative   Hep B C IgM Negative Negative  HIV antibody     Status: None   Collection Time: 12/14/14  4:01 PM  Result Value Ref Range   HIV Screen 4th Generation wRfx Non Reactive Non Reactive    Comment: (NOTE) Performed At: [facility] [place], [place] [id number] Lindon Romp MD [id number]   Antistreptolysin O titer     Status: None   Collection Time: 12/14/14  4:01 PM  Result Value Ref Range   ASO 48.0 0.0 - 200.0 IU/mL    Comment: (NOTE) Performed At: [facility] [place], [place] [id number] Lindon Romp MD WV:[id number]   C3 complement     Status: Abnormal   Collection Time: 12/14/14  6:25 PM  Result Value Ref Range   C3 Complement 81 (L) 82 - 167 mg/dL    Comment: (NOTE) Performed At: [facility] [place], [place] [id number] Lindon Romp MD [id number]   C4 complement     Status: None   Collection Time: 12/14/14  6:25 PM  Result Value Ref Range   Complement C4, Body Fluid 20 14 - 44 mg/dL     Comment: (NOTE) Performed At: [facility] [place], [place] [id number] Lindon Romp MD OF:[id number]   Basic metabolic panel     Status: Abnormal   Collection Time: 12/15/14  5:29 AM  Result Value Ref Range   Sodium 138 135 - 145 mmol/L   Potassium 4.1 3.5 - 5.1 mmol/L    Comment: DELTA CHECK NOTED   Chloride 111 101 - 111 mmol/L   CO2 18 (L) 22 - 32 mmol/L   Glucose, Bld 84 65 - 99 mg/dL   BUN 25 (H) 6 - 20 mg/dL   Creatinine, Ser 3.31 (H) 0.61 - 1.24 mg/dL   Calcium 8.2 (L) 8.9 - 10.3 mg/dL   GFR calc non Af Amer 25 (L) >60 mL/min   GFR calc Af Amer 29 (L) >60 mL/min    Comment: (NOTE) The eGFR has been calculated using the CKD EPI equation. This calculation has not been validated in all clinical situations. eGFR's persistently <60 mL/min signify possible Chronic Kidney Disease.    Anion gap 9 5 - 15  CBC     Status: Abnormal   Collection Time: 12/15/14  5:29 AM  Result Value Ref Range   WBC 5.3 4.0 - 10.5 K/uL   RBC 3.55 (L) 4.22 - 5.81 MIL/uL   Hemoglobin 11.0 (L) 13.0 - 17.0 g/dL    Comment: REPEATED TO VERIFY   HCT 31.7 (L) 39.0 - 52.0 %   MCV 89.3 78.0 - 100.0 fL   MCH 31.0 26.0 - 34.0 pg   MCHC 34.7 30.0 - 36.0 g/dL   RDW 12.3 11.5 - 15.5 %   Platelets 115 (L) 150 - 400 K/uL    Comment: PLATELET COUNT CONFIRMED BY SMEAR   Assessment:  #1 acute kidney injury: Possibilies include acute interstitial nephritis secondary to Bactrim use as renal dysfunction presented itself following this medication and he has never used it before vs. IgA nephropathy given the reassuring serologic workup of his hematuria, absence of familial syndromes or systemic illness, recurrence of hematuria. The urine protein creatinine ratio is elevated, but he  is not volume overloaded on exam nor is he hypoalbuminemic which would be suggestive of nephrotic syndrome. Hemodynamically stable as well. -Continue current management and would favor renal biopsy should he not  improve with conservative management  Charlott Rakes 12/15/2014, 12:46 PM    Renal Attending: My concern is that he may have IgA nephropathy which can mascarade as UTIs and recurrent microhematuria. The AKI is alarming and could be from AIN due to Bactrim, or crescentic GN from IgA. She has a longstanding history of proteinuria and microhematuria. If he worsens we will obtain a renal biopsy. Tyna Huertas C

## 2014-12-15 NOTE — Progress Notes (Signed)
Subjective: Patient reports feeling better today after the IVF.  Has no active complaints.  No current dysuria or abdominal pain Objective: Vital signs in last 24 hours: Filed Vitals:   12/14/14 1702 12/14/14 1709 12/14/14 2104 12/15/14 0448  BP: 108/63  104/65 114/60  Pulse: 72  69 71  Temp: 99 F (37.2 C)  98.5 F (36.9 C) 98.2 F (36.8 C)  TempSrc: Oral  Oral Oral  Resp: Height:   (1.753 m)    Weight:  151 lb 8 oz (68.72 kg)    SpO2: 100%  100% 100%   Weight change:   Intake/Output Summary (Last 24 hours) at 12/15/14 0926 Last data filed at 12/15/14 0843  Gross per 24 hour  Intake 3027.5 ml  Output    500 ml  Net 2527.5 ml   General: resting in bed HEENT:  EOMI, no scleral icterus Cardiac: RRR, no murmurs  Pulm: clear to auscultation bilaterally, moving normal volumes of air Abd: soft, nontender, nondistended, BS present Ext: warm and well perfused, no pedal edema Neuro: alert and oriented Lab Results: Basic Metabolic Panel:  Recent Labs Lab 12/14/14 0738 12/14/14 1601 12/15/14 0529  NA 135  --  138  K 3.3*  --  4.1  CL 103  --  111  CO2 22  --  18*  GLUCOSE 93  --  84  BUN 23*  --  25*  CREATININE 2.87*  --  3.31*  CALCIUM 9.2  --  8.2*  MG  --  2.0  --    Liver Function Tests:  Recent Labs Lab 12/14/14 0738  AST 28  ALT 19  ALKPHOS 56  BILITOT 0.8  PROT 7.0  ALBUMIN 3.7    Recent Labs Lab 12/14/14 0738  LIPASE 124*   No results for input(s): AMMONIA in the last 168 hours. CBC:  Recent Labs Lab 12/14/14 0738 12/15/14 0529  WBC 6.2 5.3  NEUTROABS 4.2  --   HGB 13.8 11.0*  HCT 38.1* 31.7*  MCV 87.0 89.3  PLT 127* 115*   Cardiac Enzymes:  Recent Labs Lab 12/14/14 0738  CKTOTAL 41*   Urine Drug Screen: Drugs of Abuse     Component Value Date/Time   LABOPIA NONE DETECTED 12/14/2014 0844   COCAINSCRNUR NONE DETECTED 12/14/2014 0844   LABBENZ NONE DETECTED 12/14/2014 0844   AMPHETMU NONE DETECTED  12/14/2014 0844   THCU NONE DETECTED 12/14/2014 0844   LABBARB NONE DETECTED 12/14/2014 0844    Alcohol Level: No results for input(s): ETH in the last 168 hours. Urinalysis:  Recent Labs Lab 12/12/14 1734 12/14/14 0844  COLORURINE RED* RED*  LABSPEC 1.034* 1.021  PHURINE 5.5 5.5  GLUCOSEU NEGATIVE NEGATIVE  HGBUR LARGE* LARGE*  BILIRUBINUR MODERATE* LARGE*  KETONESUR 15* 15*  PROTEINUR >300* >300*  UROBILINOGEN 0.2 0.2  NITRITE NEGATIVE POSITIVE*  LEUKOCYTESUR SMALL* MODERATE*    Micro Results: Recent Results (from the past 240 hour(s))  Urine culture     Status: None   Collection Time: 12/12/14  5:32 PM  Result Value Ref Range Status   Specimen Description URINE, CLEAN CATCH  Final   Special Requests NONE  Final   Culture MULTIPLE SPECIES PRESENT, SUGGEST RECOLLECTION  Final   Report Status 12/14/2014 FINAL  Final  Urine culture     Status: None (Preliminary result)   Collection Time: 12/14/14  8:44 AM  Result Value Ref Range Status   Specimen Description URINE, CLEAN CATCH  Final  Special Requests NONE  Final   Culture PENDING  Incomplete   Report Status PENDING  Incomplete   Studies/Results: No results found. Medications: I have reviewed the patient's current medications. Scheduled Meds: . enoxaparin (LOVENOX) injection  40 mg Subcutaneous Q24H  . feeding supplement  1 Container Oral TID BM  . levofloxacin (LEVAQUIN) IV  750 mg Intravenous Once   Followed by  . [START ON 12/16/2014] levofloxacin (LEVAQUIN) IV  500 mg Intravenous Q48H   Continuous Infusions: . 0.9 % NaCl with KCl 20 mEq / L 150 mL/hr at 12/15/14 0813   PRN Meds:.promethazine   Recent Labs Lab 12/12/14 1123 12/14/14 0738 12/15/14 0529  CREATININE 1.22 2.87* 3.31*     Assessment/Plan:   Acute kidney injury - Patient has gotten IVF but SCr continues to rise.  His recurrent bouts of questionable UTI along with RBC and proteinuria is concerning for a vasculitis or auto immune cause to  his symptoms.   - Our infectious/ autoimmune workup is so far negative.  CT does not show hydronephrosis.  We will entertain the through of an AIN from bactrim and check Urine eosinophils. - Will also consult nephrology - Dr Lowell Guitar as he may eventually need a renal biopsy if he is no improving.  UTI - U/A is consistent with a UTI and it does appear he was having symptoms at least at his initial ED presentation on the 16th.  Urine culture from that time however shows multiple species present. - Continue Levaquin will change to PO prior to next dose.  Hypokalemia -Repeated now potassium wnl.  Dispo: Disposition is deferred at this time, awaiting improvement of current medical problems.    The patient does have a current PCP (No Pcp Per Patient) and does not know need an [facility] hospital follow-up appointment after discharge.  The patient does not have transportation limitations that hinder transportation to clinic appointments.  .Services Needed at time of discharge: Y = Yes, Blank = No PT:   OT:   RN:   Equipment:   Other:       Gust Rung, DO 12/15/2014, 9:26 AM

## 2014-12-15 NOTE — Progress Notes (Signed)
Patient vomited after lunch. He did not want phenergan, since he felt better after vomiting.

## 2014-12-16 DIAGNOSIS — N390 Urinary tract infection, site not specified: Secondary | ICD-10-CM | POA: Diagnosis not present

## 2014-12-16 DIAGNOSIS — B9689 Other specified bacterial agents as the cause of diseases classified elsewhere: Secondary | ICD-10-CM | POA: Diagnosis not present

## 2014-12-16 DIAGNOSIS — N179 Acute kidney failure, unspecified: Secondary | ICD-10-CM | POA: Diagnosis not present

## 2014-12-16 DIAGNOSIS — R110 Nausea: Secondary | ICD-10-CM | POA: Diagnosis not present

## 2014-12-16 DIAGNOSIS — R319 Hematuria, unspecified: Secondary | ICD-10-CM | POA: Insufficient documentation

## 2014-12-16 LAB — RENAL FUNCTION PANEL
ALBUMIN: 2.7 g/dL — AB (ref 3.5–5.0)
Anion gap: 7 (ref 5–15)
BUN: 25 mg/dL — AB (ref 6–20)
CALCIUM: 8.6 mg/dL — AB (ref 8.9–10.3)
CO2: 20 mmol/L — AB (ref 22–32)
CREATININE: 3.41 mg/dL — AB (ref 0.61–1.24)
Chloride: 111 mmol/L (ref 101–111)
GFR calc Af Amer: 28 mL/min — ABNORMAL LOW (ref >60)
GFR calc non Af Amer: 24 mL/min — ABNORMAL LOW (ref >60)
GLUCOSE: 75 mg/dL (ref 65–99)
PHOSPHORUS: 3.3 mg/dL (ref 2.5–4.6)
Potassium: 4.2 mmol/L (ref 3.5–5.1)
SODIUM: 138 mmol/L (ref 135–145)

## 2014-12-16 NOTE — Progress Notes (Signed)
Subjective: This AM, he was asleep but arousable and denied any complaints. He reports feeling somewhat better though not worse. He reports no dysuria and describes it as tan-colored.  Objective: Vital signs in last 24 hours: Temp:  [98.6 F (37 C)-99 F (37.2 C)] 98.6 F (37 C) (08/20 0625) Pulse Rate:  [55-58] 58 (08/20 0625) Resp:  [16-19] 19 (08/20 0625) BP: (122-133)/(69-81) 129/79 mmHg (08/20 0625) SpO2:  [99 %-100 %] 100 % (08/20 0625) Weight change:   Intake/Output from previous day: 08/19 0701 - 08/20 0700 In: 700 [P.O.:700] Out: 1300 [Urine:1300] Intake/Output this shift:   General: young Caucasian male, resting in bed, NAD Cardiac: RRR, no rubs, murmurs or gallops Pulm: clear to auscultation bilaterally, no wheezes, rales, or rhonchi Abd: soft, nontender, nondistended, BS present Ext: warm and well perfused, no pedal edema Neuro: responds to questions appropriately; moving all extremities freely   Lab Results:  Recent Labs  12/14/14 0738 12/15/14 0529  WBC 6.2 5.3  HGB 13.8 11.0*  HCT 38.1* 31.7*  PLT 127* 115*   BMET:  Recent Labs  12/15/14 1346 12/16/14 0553  NA 139 138  K 3.6 4.2  CL 113* 111  CO2 20* 20*  GLUCOSE 91 75  BUN 23* 25*  CREATININE 3.36* 3.41*  CALCIUM 7.9* 8.6*   Scheduled: . enoxaparin (LOVENOX) injection  40 mg Subcutaneous Q24H  . feeding supplement  1 Container Oral TID BM  . levofloxacin  750 mg Oral Q48H    Assessment/Plan: Acute kidney injury: Possibly AIN 2/2 use of Bactrim or IgA nephropathy given hematuria noted on prior UA. 1.3L urine output yesterday which is reassuring with IV fluids. Creatinine stable at 3.4 today. Elevated CRP noted in the setting of an inflammatory response as well as low albumin today. -Consult IR for ultrasound-guided renal biopsy on Monday -Continue following electrolytes and consider empiric treatment with steroids should his renal function deteriorate -Collect 24 hour urine sample to  measure protein    Allena Katz, [place] 12/16/2014,8:50 AM   Renal Attending: After discussion with pt and father, unless renal fct improves over next couple of days will proceed with renal biopsy to rule out AIN v RPGN due to IgA. Of note, sAlbumin is down to 2.7. Naresh Althaus C

## 2014-12-16 NOTE — Progress Notes (Signed)
Pt began 24-hour protein urine collection at 1411.  At this time, educated the pt about importance of collection and to inform staff of urine output.  Pt verbalized understanding.  We will continue to monitor at this time.

## 2014-12-16 NOTE — Progress Notes (Signed)
Subjective: Feels better but had another episode of nausea this afternoon.  He has started to urinate more. Objective: Vital signs in last 24 hours: Filed Vitals:   12/15/14 1428 12/15/14 2211 12/16/14 0625 12/16/14 1353  BP: 122/69 133/81 129/79 107/67  Pulse: 57 55 58 55  Temp: 99 F (37.2 C) 98.7 F (37.1 C) 98.6 F (37 C) 98.1 F (36.7 C)  TempSrc: Oral Oral Oral Oral  Resp: Height:      Weight:      SpO2: 100% 99% 100% 100%   Weight change:   Intake/Output Summary (Last 24 hours) at 12/16/14 1435 Last data filed at 12/16/14 1424  Gross per 24 hour  Intake    500 ml  Output   1450 ml  Net   -950 ml   General: resting in bed HEENT:  EOMI, no scleral icterus Cardiac: RRR, no murmurs  Pulm: clear to auscultation bilaterally, moving normal volumes of air Abd: soft, nontender, nondistended, BS present Ext: warm and well perfused, no pedal edema Neuro: alert and oriented  Lab Results: Basic Metabolic Panel:  Recent Labs Lab 12/14/14 1601  12/15/14 1346 12/16/14 0553  NA  --   < > 139 138  K  --   < > 3.6 4.2  CL  --   < > 113* 111  CO2  --   < > 20* 20*  GLUCOSE  --   < > 91 75  BUN  --   < > 23* 25*  CREATININE  --   < > 3.36* 3.41*  CALCIUM  --   < > 7.9* 8.6*  MG 2.0  --   --   --   PHOS  --   --   --  3.3  < > = values in this interval not displayed. Liver Function Tests:  Recent Labs Lab 12/14/14 0738 12/16/14 0553  AST 28  --   ALT 19  --   ALKPHOS 56  --   BILITOT 0.8  --   PROT 7.0  --   ALBUMIN 3.7 2.7*    Recent Labs Lab 12/14/14 0738  LIPASE 124*   No results for input(s): AMMONIA in the last 168 hours. CBC:  Recent Labs Lab 12/14/14 0738 12/15/14 0529  WBC 6.2 5.3  NEUTROABS 4.2  --   HGB 13.8 11.0*  HCT 38.1* 31.7*  MCV 87.0 89.3  PLT 127* 115*   Cardiac Enzymes:  Recent Labs Lab 12/14/14 0738  CKTOTAL 41*   Urine Drug Screen: Drugs of Abuse     Component Value Date/Time   LABOPIA NONE  DETECTED 12/14/2014 0844   COCAINSCRNUR NONE DETECTED 12/14/2014 0844   LABBENZ NONE DETECTED 12/14/2014 0844   AMPHETMU NONE DETECTED 12/14/2014 0844   THCU NONE DETECTED 12/14/2014 0844   LABBARB NONE DETECTED 12/14/2014 0844    Alcohol Level: No results for input(s): ETH in the last 168 hours. Urinalysis:  Recent Labs Lab 12/12/14 1734 12/14/14 0844  COLORURINE RED* RED*  LABSPEC 1.034* 1.021  PHURINE 5.5 5.5  GLUCOSEU NEGATIVE NEGATIVE  HGBUR LARGE* LARGE*  BILIRUBINUR MODERATE* LARGE*  KETONESUR 15* 15*  PROTEINUR >300* >300*  UROBILINOGEN 0.2 0.2  NITRITE NEGATIVE POSITIVE*  LEUKOCYTESUR SMALL* MODERATE*    Micro Results: Recent Results (from the past 240 hour(s))  Urine culture     Status: None   Collection Time: 12/12/14  5:32 PM  Result Value Ref Range Status   Specimen Description URINE,  CLEAN CATCH  Final   Special Requests NONE  Final   Culture MULTIPLE SPECIES PRESENT, SUGGEST RECOLLECTION  Final   Report Status 12/14/2014 FINAL  Final  Urine culture     Status: None   Collection Time: 12/14/14  8:44 AM  Result Value Ref Range Status   Specimen Description URINE, CLEAN CATCH  Final   Special Requests NONE  Final   Culture NO GROWTH 1 DAY  Final   Report Status 12/15/2014 FINAL  Final   Studies/Results: No results found. Medications: I have reviewed the patient's current medications. Scheduled Meds: . feeding supplement  1 Container Oral TID BM  . levofloxacin  750 mg Oral Q48H   Continuous Infusions:   PRN Meds:.promethazine   Recent Labs Lab 12/12/14 1123 12/14/14 0738 12/15/14 0529 12/15/14 1346 12/16/14 0553  CREATININE 1.22 2.87* 3.31* 3.36* 3.41*     Assessment/Plan:   Acute kidney injury - Leading differential is IgA nephropathy versus AIN due to Bactrim.  We are concerned given his young age and previous history of proteinuria and hematuria.  - SCr appears stable for now with urine output improving - Tentative plan for renal  biopsy on Monday.  UTI - Continue levaquin  Hypokalemia -Repeated now potassium wnl.  Dispo: Disposition is deferred at this time, awaiting improvement of current medical problems.    The patient does have a current PCP (No Pcp Per Patient) and does not know need an [facility] hospital follow-up appointment after discharge.  The patient does not have transportation limitations that hinder transportation to clinic appointments.  .Services Needed at time of discharge: Y = Yes, Blank = No PT:   OT:   RN:   Equipment:   Other:       Gust Rung, DO 12/16/2014, 2:35 PM

## 2014-12-17 ENCOUNTER — Encounter (HOSPITAL_COMMUNITY): Payer: Self-pay | Admitting: Radiology

## 2014-12-17 DIAGNOSIS — N179 Acute kidney failure, unspecified: Secondary | ICD-10-CM | POA: Diagnosis not present

## 2014-12-17 LAB — BASIC METABOLIC PANEL
ANION GAP: 9 (ref 5–15)
BUN: 28 mg/dL — ABNORMAL HIGH (ref 6–20)
CHLORIDE: 110 mmol/L (ref 101–111)
CO2: 21 mmol/L — ABNORMAL LOW (ref 22–32)
CREATININE: 3.32 mg/dL — AB (ref 0.61–1.24)
Calcium: 8.8 mg/dL — ABNORMAL LOW (ref 8.9–10.3)
GFR calc non Af Amer: 25 mL/min — ABNORMAL LOW (ref >60)
GFR, EST AFRICAN AMERICAN: 29 mL/min — AB (ref >60)
Glucose, Bld: 85 mg/dL (ref 65–99)
POTASSIUM: 4.3 mmol/L (ref 3.5–5.1)
SODIUM: 140 mmol/L (ref 135–145)

## 2014-12-17 LAB — PROTIME-INR
INR: 1.26 (ref 0.00–1.49)
PROTHROMBIN TIME: 15.9 s — AB (ref 11.6–15.2)

## 2014-12-17 MED ORDER — PREDNISONE 50 MG PO TABS
60.0000 mg | ORAL_TABLET | Freq: Once | ORAL | Status: AC
  Administered 2014-12-17: 60 mg via ORAL
  Filled 2014-12-17 (×2): qty 1

## 2014-12-17 NOTE — Progress Notes (Addendum)
ANTIBIOTIC CONSULT NOTE - FOLLOW UP  Pharmacy Consult for Levaquin Indication: UTI  No Known Allergies  Patient Measurements: Height:  (175.3 cm) Weight: 151 lb 8 oz (68.72 kg) IBW/kg (Calculated) : 70.7  Vital Signs: Temp: 98.2 F (36.8 C) (08/21 0606) Temp Source: Oral (08/21 0606) BP: 113/71 mmHg (08/21 0606) Pulse Rate: 54 (08/21 0606) Intake/Output from previous day: 08/20 0701 - 08/21 0700 In: 800 [P.O.:800] Out: 1175 [Urine:1175] Intake/Output from this shift:    Labs:  Recent Labs  12/15/14 0529 12/15/14 1144 12/15/14 1346 12/16/14 0553 12/17/14 0535  WBC 5.3  --   --   --   --   HGB 11.0*  --   --   --   --   PLT 115*  --   --   --   --   LABCREA  --  122.74  --   --   --   CREATININE 3.31*  --  3.36* 3.41* 3.32*   Estimated Creatinine Clearance: 34.5 mL/min (by C-G formula based on Cr of 3.32). No results for input(s): VANCOTROUGH, VANCOPEAK, VANCORANDOM, GENTTROUGH, GENTPEAK, GENTRANDOM, TOBRATROUGH, TOBRAPEAK, TOBRARND, AMIKACINPEAK, AMIKACINTROU, AMIKACIN in the last 72 hours.   Microbiology: Recent Results (from the past 720 hour(s))  Urine culture     Status: None   Collection Time: 12/12/14  5:32 PM  Result Value Ref Range Status   Specimen Description URINE, CLEAN CATCH  Final   Special Requests NONE  Final   Culture MULTIPLE SPECIES PRESENT, SUGGEST RECOLLECTION  Final   Report Status 12/14/2014 FINAL  Final  Urine culture     Status: None   Collection Time: 12/14/14  8:44 AM  Result Value Ref Range Status   Specimen Description URINE, CLEAN CATCH  Final   Special Requests NONE  Final   Culture NO GROWTH 1 DAY  Final   Report Status 12/15/2014 FINAL  Final    Anti-infectives    Start     Dose/Rate Route Frequency Ordered Stop   12/16/14 1600  levofloxacin (LEVAQUIN) IVPB 500 mg  Status:  Discontinued     500 mg 100 mL/hr over 60 Minutes Intravenous Every 48 hours 12/14/14 1538 12/15/14 1102   12/15/14 1115  levofloxacin  (LEVAQUIN) tablet 750 mg     750 mg Oral Every 48 hours 12/15/14 1102 12/21/14 1114   12/14/14 1600  levofloxacin (LEVAQUIN) IVPB 750 mg  Status:  Discontinued     750 mg 100 mL/hr over 90 Minutes Intravenous  Once 12/14/14 1538 12/15/14 1102   12/14/14 0930  cefTRIAXone (ROCEPHIN) 1 g in dextrose 5 % 50 mL IVPB     1 g 100 mL/hr over 30 Minutes Intravenous  Once 12/14/14 0925 12/14/14 1032      Assessment: 19 y/o male presented to the ED with diarrhea, vomiting and poor appetite found to be in ARF. He was started on Levaquin for UTI. Today is day 4 of antibiotic therapy. Culture is neg. He is afebrile and WBC are normal. UOP is good and SCr is on downward trend.   Levaquin 8/19>>  CTX x 1 8/18   8/18 urine cx neg  Goal of Therapy:  Eradication of infection  Plan:  - 5 days of therapy recommended at current Levaquin dose - IMTS please consider adjusting stop date - Pharmacy signing off, please re-consult if needed  [facility], [place].D., BCPS Clinical Pharmacist Pager: [phone] 12/17/2014 10:15 AM

## 2014-12-17 NOTE — Progress Notes (Signed)
Subjective: This AM, he reports still not able to tolerate oral intake. Denies any foamy urine.  Objective: Vital signs in last 24 hours: Temp:  [97.2 F (36.2 C)-98.2 F (36.8 C)] 98.2 F (36.8 C) (08/21 0606) Pulse Rate:  [54-55] 54 (08/21 0606) Resp:  [18-19] 18 (08/21 0606) BP: (107-119)/(67-78) 113/71 mmHg (08/21 0606) SpO2:  [99 %-100 %] 99 % (08/21 0606) Weight change:   Intake/Output from previous day: 08/20 0701 - 08/21 0700 In: 800 [P.O.:800] Out: 1175 [Urine:1175] Intake/Output this shift: Total I/O In: 240 [P.O.:240] Out: 480 [Urine:480]  General: young Caucasian male, resting in bed, NAD Cardiac: RRR, no rubs, murmurs or gallops Pulm: clear to auscultation bilaterally, no wheezes, rales, or rhonchi Abd: soft, nontender, nondistended, BS present Ext: warm and well perfused, no pedal edema Neuro: responds to questions appropriately; moving all extremities freely   Lab Results:  Recent Labs  12/15/14 0529  WBC 5.3  HGB 11.0*  HCT 31.7*  PLT 115*   BMET:   Recent Labs  12/16/14 0553 12/17/14 0535  NA 138 140  K 4.2 4.3  CL 111 110  CO2 20* 21*  GLUCOSE 75 85  BUN 25* 28*  CREATININE 3.41* 3.32*  CALCIUM 8.6* 8.8*   Scheduled: . feeding supplement  1 Container Oral TID BM  . levofloxacin  750 mg Oral Q48H    Assessment/Plan: Acute kidney injury: Appears more consistent with IgA nephropathy given hematuria noted on prior UA and elevated creatinine stable at 3.4 as an allergic etiology which show signs of resolution by now. 1.2L urine output yesterday which is reassuring. Unclear of what may be driving his abdominal symptoms. -IR on board for ultrasound-guided renal biopsy tomorrow -Completed paperwork for pathology and placed in patient's chart was sent with specimen -Empirically start prednisone 60 mg daily -Follow-up 24-hour urine protein    Allena Katz, [place] 12/17/2014,1:12 PM   Renal Attending: Will plan biopsy for Monday.  Concern of  AIN or IgA nephropathy.  Nausea and vomiting of concern and possibly related to the latter.  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  Will begin steroids. Karne Ozga C

## 2014-12-17 NOTE — H&P (Signed)
Chief Complaint: Patient was seen in consultation today for acute renal failure Chief Complaint  Patient presents with  . Nausea  . Urinary Tract Infection  . Urinary Retention   at the request of [facility]  Referring Physician(s): [facility]  History of Present Illness: Johnny Conner is a 19 y.o. male who presented with c/o gross hematuria since 8/15 and vomiting. He denies any new medications, denies any foamy urine, shortness of breath or leg swelling. He does admit to history of hematuria 3 years ago treated with some type of medication and increased hydration with no recurrent symptoms until now. [facility] has seen the patient and requested renal biopsy. He denies any chest pain, shortness of breath or palpitations. He denies any active signs of bleeding or excessive bruising. He denies any recent fever or chills. The patient denies any history of sleep apnea or chronic oxygen use. He has previously tolerated anesthesia without complications.    Past Medical History  Diagnosis Date  . Pyelonephritis 2012    treated with 3 days of unspecified abx    History reviewed. No pertinent past surgical history.  Allergies: Review of patient's allergies indicates no known allergies.  Medications: Prior to Admission medications   Medication Sig Start Date End Date Taking? Authorizing Provider  ondansetron (ZOFRAN ODT) 4 MG disintegrating tablet  ODT q6 hours prn nausea/vomit 12/12/14  Yes Felicie Morn, NP  sulfamethoxazole-trimethoprim (BACTRIM DS,SEPTRA DS) 800-160 MG per tablet Take 1 tablet by mouth 2 (two) times daily. 12/12/14 12/19/14 Yes Felicie Morn, NP  ondansetron (ZOFRAN) 4 MG tablet Take 1 tablet (4 mg total) by mouth every 6 (six) hours. Patient not taking: Reported on 12/12/2014 06/30/14   Gilda Crease, MD     History reviewed. No pertinent family history.  Social History   Social History  . Marital Status: Single    Spouse Name: N/A  . Number of Children: N/A  . Years  of Education: N/A   Social History Main Topics  . Smoking status: Current Every Day Smoker -- 0.10 packs/day for 1 years    Types: Cigarettes  . Smokeless tobacco: Never Used  . Alcohol Use: 0.0 - 0.6 oz/week    0-1 Standard drinks or equivalent per week  . Drug Use: No  . Sexual Activity: Not Currently     Comment: Last sexual experience 7-8 m ago, patient had a negative STI test since then   Other Topics Concern  . None   Social History Narrative    Review of Systems: A 12 point ROS discussed and pertinent positives are indicated in the HPI above.  All other systems are negative.  Review of Systems  Vital Signs: BP 113/71 mmHg  Pulse 54  Temp(Src) 98.2 F (36.8 C) (Oral)  Resp 18  Ht  (1.753 m)  Wt 151 lb 8 oz (68.72 kg)  BMI 22.36 kg/m2  SpO2 99%  Physical Exam  Constitutional: He is oriented to person, place, and time. No distress.  HENT:  Head: Normocephalic and atraumatic.  Neck: No tracheal deviation present.  Cardiovascular: Normal rate and regular rhythm.  Exam reveals no gallop and no friction rub.   No murmur heard. Pulmonary/Chest: Effort normal and breath sounds normal. No respiratory distress. He has no wheezes. He has no rales.  Abdominal: Soft. Bowel sounds are normal. He exhibits no distension. There is no tenderness.  Neurological: He is alert and oriented to person, place, and time.  Skin: Skin is warm and  dry. He is not diaphoretic.  Psychiatric: He has a normal mood and affect. His behavior is normal. Thought content normal.    Mallampati Score:  MD Evaluation Airway: WNL Heart: WNL Abdomen: WNL Chest/ Lungs: WNL ASA  Classification: 2 Mallampati/Airway Score: One  Imaging: Ct Renal Stone Study  12/12/2014   CLINICAL DATA:  Vomiting for 2 days, kidney infection  EXAM: CT ABDOMEN AND PELVIS WITHOUT CONTRAST  TECHNIQUE: Multidetector CT imaging of the abdomen and pelvis was performed following the standard protocol without IV  contrast.  COMPARISON:  06/30/2014  FINDINGS: Lung bases are unremarkable. Sagittal images of the spine are unremarkable.  Unenhanced liver, pancreas, spleen and adrenal glands are unremarkable. No calcified gallstones are noted within gallbladder. No aortic aneurysm. Unenhanced kidneys are symmetrical in size. No nephrolithiasis. No hydronephrosis or hydroureter.  No aortic aneurysm.  There is no pericecal inflammation.  Normal appendix partially visualized in coronal image 36. Mild levoscoliosis lumbar spine.  Bilateral distal ureter is unremarkable. No calcified ureteral calculi. There is mild thickening of urinary bladder wall. Mild cystitis cannot be excluded. Prostate gland is unremarkable.  No small bowel obstruction. No ascites or free air. No adenopathy. Again noted high-density exophytic cyst in left kidney stable in size in appearance from prior exam measures about 5 mm. Right renal cyst again noted measures 1.7 cm.  IMPRESSION: 1. There is no nephrolithiasis.  No hydronephrosis or hydroureter. 2. Stable bilateral renal cysts. 3.  No calcified ureteral calculi. 4. Normal appendix partially visualized. 5. Mild thickening of urinary bladder wall. Cystitis cannot be excluded. Clinical correlation is necessary.   Electronically Signed   By: Natasha Mead M.D.   On: 12/12/2014 17:11    Labs:  CBC:  Recent Labs  06/30/14 1823 12/12/14 1123 12/14/14 0738 12/15/14 0529  WBC 7.8 10.6* 6.2 5.3  HGB 15.2 13.6 13.8 11.0*  HCT 44.6 39.4 38.1* 31.7*  PLT 174 178 127* 115*    COAGS:  Recent Labs  12/17/14 0535  INR 1.26    BMP:  Recent Labs  12/15/14 0529 12/15/14 1346 12/16/14 0553 12/17/14 0535  NA 138 139 138 140  K 4.1 3.6 4.2 4.3  CL 111 113* 111 110  CO2 18* 20* 20* 21*  GLUCOSE 84 91 75 85  BUN 25* 23* 25* 28*  CALCIUM 8.2* 7.9* 8.6* 8.8*  CREATININE 3.31* 3.36* 3.41* 3.32*  GFRNONAA 25* 25* 24* 25*  GFRAA 29* 29* 28* 29*    LIVER FUNCTION TESTS:  Recent Labs   12/14/14 0738 12/16/14 0553  BILITOT 0.8  --   AST 28  --   ALT 19  --   ALKPHOS 56  --   PROT 7.0  --   ALBUMIN 3.7 2.7*    Assessment and Plan: Acute renal failure Hematuria since 8/15, history of hematuria 3 years ago Vomiting, last episode last night Nephrology has seen and requested image guided renal biopsy with sedation The patient has been NPO, no blood thinners taken, labs and vitals have been reviewed. Risks and Benefits discussed with the patient including, but not limited to bleeding, infection, damage to adjacent structures or low yield requiring additional tests. All of the patient's questions were answered, patient is agreeable to proceed. Consent signed and in chart.    Thank you for this interesting consult.  I greatly enjoyed meeting Johnny Conner and look forward to participating in their care.  A copy of this report was sent to the requesting provider on this date.  SignedBerneta Levins 12/17/2014, 10:26 AM   I spent a total of 20 Minutes in face to face in clinical consultation, greater than 50% of which was counseling/coordinating care for acute kidney injury and hematuria.

## 2014-12-17 NOTE — Progress Notes (Signed)
Subjective: Still having some nausea and emesis yesterday, does report that phenergan is helpful.  Still able to tolerate oral liquids overall. Objective: Vital signs in last 24 hours: Filed Vitals:   12/16/14 0625 12/16/14 1353 12/16/14 2227 12/17/14 0606  BP: 129/79 107/67 119/78 113/71  Pulse: 58 55 55 54  Temp: 98.6 F (37 C) 98.1 F (36.7 C) 97.2 F (36.2 C) 98.2 F (36.8 C)  TempSrc: Oral Oral Oral Oral  Resp: 19 19 18 18   Height:      Weight:      SpO2: 100% 100% 99% 99%   Weight change:   Intake/Output Summary (Last 24 hours) at 12/17/14 1107 Last data filed at 12/17/14 1048  Gross per 24 hour  Intake    740 ml  Output   1655 ml  Net   -915 ml   General: resting in bed HEENT:  EOMI, no scleral icterus Cardiac: RRR, no murmurs  Pulm: clear to auscultation bilaterally Abd: soft, nontender, nondistended, BS present Ext: warm and well perfused, no pedal edema Neuro: alert and oriented  Lab Results: Basic Metabolic Panel:  Recent Labs Lab 12/14/14 1601  12/16/14 0553 12/17/14 0535  NA  --   < > 138 140  K  --   < > 4.2 4.3  CL  --   < > 111 110  CO2  --   < > 20* 21*  GLUCOSE  --   < > 75 85  BUN  --   < > 25* 28*  CREATININE  --   < > 3.41* 3.32*  CALCIUM  --   < > 8.6* 8.8*  MG 2.0  --   --   --   PHOS  --   --  3.3  --   < > = values in this interval not displayed. Liver Function Tests:  Recent Labs Lab 12/14/14 0738 12/16/14 0553  AST 28  --   ALT 19  --   ALKPHOS 56  --   BILITOT 0.8  --   PROT 7.0  --   ALBUMIN 3.7 2.7*    Recent Labs Lab 12/14/14 0738  LIPASE 124*   No results for input(s): AMMONIA in the last 168 hours. CBC:  Recent Labs Lab 12/14/14 0738 12/15/14 0529  WBC 6.2 5.3  NEUTROABS 4.2  --   HGB 13.8 11.0*  HCT 38.1* 31.7*  MCV 87.0 89.3  PLT 127* 115*   Cardiac Enzymes:  Recent Labs Lab 12/14/14 0738  CKTOTAL 41*   Urine Drug Screen: Drugs of Abuse     Component Value Date/Time   LABOPIA NONE  DETECTED 12/14/2014 0844   COCAINSCRNUR NONE DETECTED 12/14/2014 0844   LABBENZ NONE DETECTED 12/14/2014 0844   AMPHETMU NONE DETECTED 12/14/2014 0844   THCU NONE DETECTED 12/14/2014 0844   LABBARB NONE DETECTED 12/14/2014 0844    Alcohol Level: No results for input(s): ETH in the last 168 hours. Urinalysis:  Recent Labs Lab 12/12/14 1734 12/14/14 0844  COLORURINE RED* RED*  LABSPEC 1.034* 1.021  PHURINE 5.5 5.5  GLUCOSEU NEGATIVE NEGATIVE  HGBUR LARGE* LARGE*  BILIRUBINUR MODERATE* LARGE*  KETONESUR 15* 15*  PROTEINUR >300* >300*  UROBILINOGEN 0.2 0.2  NITRITE NEGATIVE POSITIVE*  LEUKOCYTESUR SMALL* MODERATE*    Micro Results: Recent Results (from the past 240 hour(s))  Urine culture     Status: None   Collection Time: 12/12/14  5:32 PM  Result Value Ref Range Status   Specimen Description URINE, CLEAN  CATCH  Final   Special Requests NONE  Final   Culture MULTIPLE SPECIES PRESENT, SUGGEST RECOLLECTION  Final   Report Status 12/14/2014 FINAL  Final  Urine culture     Status: None   Collection Time: 12/14/14  8:44 AM  Result Value Ref Range Status   Specimen Description URINE, CLEAN CATCH  Final   Special Requests NONE  Final   Culture NO GROWTH 1 DAY  Final   Report Status 12/15/2014 FINAL  Final   Studies/Results: No results found. Medications: I have reviewed the patient's current medications. Scheduled Meds: . feeding supplement  1 Container Oral TID BM  . levofloxacin  750 mg Oral Q48H   Continuous Infusions:   PRN Meds:.promethazine   Recent Labs Lab 12/14/14 0738 12/15/14 0529 12/15/14 1346 12/16/14 0553 12/17/14 0535  CREATININE 2.87* 3.31* 3.36* 3.41* 3.32*     Assessment/Plan:   Acute kidney injury - Leading differential is IgA nephropathy versus AIN due to Bactrim.  We are concerned given his young age and previous history of proteinuria and hematuria.  - SCr remains stable for now with urine output still improving - Plan for renal  biopsy tomorrow  UTI - Continue levaquin will treat for total of 7 days  (count start date of 8/16-> Bactrim) end date 8/23  Hypokalemia -Repeated now potassium wnl.  Dispo: Disposition is deferred at this time, awaiting improvement of current medical problems.  Patient will likely need to stay till at least Tuesday to await results of biopsy.  The patient does have a current PCP (No Pcp Per Patient) and does not know need an [facility] hospital follow-up appointment after discharge.  The patient does not have transportation limitations that hinder transportation to clinic appointments.  .Services Needed at time of discharge: Y = Yes, Blank = No PT:   OT:   RN:   Equipment:   Other:       Gust Rung, DO 12/17/2014, 11:07 AM

## 2014-12-18 ENCOUNTER — Observation Stay (HOSPITAL_COMMUNITY): Payer: BLUE CROSS/BLUE SHIELD

## 2014-12-18 DIAGNOSIS — E876 Hypokalemia: Secondary | ICD-10-CM | POA: Diagnosis not present

## 2014-12-18 DIAGNOSIS — N179 Acute kidney failure, unspecified: Secondary | ICD-10-CM | POA: Diagnosis not present

## 2014-12-18 LAB — CBC
HEMATOCRIT: 34 % — AB (ref 39.0–52.0)
Hemoglobin: 12.3 g/dL — ABNORMAL LOW (ref 13.0–17.0)
MCH: 31.1 pg (ref 26.0–34.0)
MCHC: 36.2 g/dL — ABNORMAL HIGH (ref 30.0–36.0)
MCV: 86.1 fL (ref 78.0–100.0)
Platelets: 192 10*3/uL (ref 150–400)
RBC: 3.95 MIL/uL — ABNORMAL LOW (ref 4.22–5.81)
RDW: 11.7 % (ref 11.5–15.5)
WBC: 7.9 10*3/uL (ref 4.0–10.5)

## 2014-12-18 LAB — BASIC METABOLIC PANEL
ANION GAP: 11 (ref 5–15)
BUN: 32 mg/dL — ABNORMAL HIGH (ref 6–20)
CALCIUM: 8.7 mg/dL — AB (ref 8.9–10.3)
CO2: 20 mmol/L — ABNORMAL LOW (ref 22–32)
Chloride: 106 mmol/L (ref 101–111)
Creatinine, Ser: 3.1 mg/dL — ABNORMAL HIGH (ref 0.61–1.24)
GFR calc Af Amer: 32 mL/min — ABNORMAL LOW (ref >60)
GFR, EST NON AFRICAN AMERICAN: 27 mL/min — AB (ref >60)
GLUCOSE: 95 mg/dL (ref 65–99)
Potassium: 4.6 mmol/L (ref 3.5–5.1)
SODIUM: 137 mmol/L (ref 135–145)

## 2014-12-18 LAB — PROTEIN, URINE, 24 HOUR
Collection Interval-UPROT: 24 hours
Collection Interval-UPROT: 24 hours
PROTEIN, URINE: 105 mg/dL
Protein, 24H Urine: 1155 mg/d — ABNORMAL HIGH (ref 50–100)
Protein, Urine: 105 mg/dL
URINE TOTAL VOLUME-UPROT: 1100 mL

## 2014-12-18 LAB — GC/CHLAMYDIA PROBE AMP (~~LOC~~) NOT AT ARMC
Chlamydia: NEGATIVE
Neisseria Gonorrhea: NEGATIVE

## 2014-12-18 MED ORDER — MIDAZOLAM HCL 2 MG/2ML IJ SOLN
INTRAMUSCULAR | Status: AC | PRN
  Administered 2014-12-18: 2 mg via INTRAVENOUS

## 2014-12-18 MED ORDER — FENTANYL CITRATE (PF) 100 MCG/2ML IJ SOLN
INTRAMUSCULAR | Status: AC | PRN
  Administered 2014-12-18: 50 ug via INTRAVENOUS

## 2014-12-18 MED ORDER — LIDOCAINE HCL (PF) 1 % IJ SOLN
INTRAMUSCULAR | Status: AC
  Filled 2014-12-18: qty 10

## 2014-12-18 MED ORDER — MIDAZOLAM HCL 2 MG/2ML IJ SOLN
INTRAMUSCULAR | Status: AC
  Filled 2014-12-18: qty 2

## 2014-12-18 MED ORDER — FENTANYL CITRATE (PF) 100 MCG/2ML IJ SOLN
INTRAMUSCULAR | Status: AC
  Filled 2014-12-18: qty 2

## 2014-12-18 MED ORDER — PREDNISONE 50 MG PO TABS
60.0000 mg | ORAL_TABLET | Freq: Every day | ORAL | Status: DC
  Administered 2014-12-19: 60 mg via ORAL
  Filled 2014-12-18 (×2): qty 1

## 2014-12-18 NOTE — Care Management Note (Signed)
Case Management Note  Patient Details  Name: CORIAN HANDLEY MRN: [id number] Date of Birth: 06/19/1995  Subjective/Objective:      Patient lives with parents, pta indep.  NCM will cont to follow for dc needs.              Action/Plan:   Expected Discharge Date:  12/16/14               Expected Discharge Plan:  Home/Self Care  In-House Referral:     Discharge planning Services  CM Consult  Post Acute Care Choice:    Choice offered to:     DME Arranged:    DME Agency:     HH Arranged:    HH Agency:     Status of Service:  In process, will continue to follow  Medicare Important Message Given:    Date Medicare IM Given:    Medicare IM give by:    Date Additional Medicare IM Given:    Additional Medicare Important Message give by:     If discussed at Long Length of Stay Meetings, dates discussed:    Additional Comments:  Leone Haven, RN 12/18/2014, 5:02 PM

## 2014-12-18 NOTE — Procedures (Signed)
LLP renal core bx 16g x2 to surg path No complication No blood loss. See complete dictation in [facility] PACS.  

## 2014-12-18 NOTE — Progress Notes (Signed)
Subjective: Nausea and voming much improved, feeling better yesterday afternoon and today.  Objective: Vital signs in last 24 hours: Filed Vitals:   12/17/14 0606 12/17/14 1640 12/17/14 2128 12/18/14 0439  BP: 113/71 131/83 131/90 119/83  Pulse: 54 58 58 68  Temp: 98.2 F (36.8 C) 98.2 F (36.8 C) 98.6 F (37 C) 98.3 F (36.8 C)  TempSrc: Oral Oral Oral Oral  Resp: 18 18 18 18   Height:      Weight:      SpO2: 99% 99% 100% 100%   Weight change:   Intake/Output Summary (Last 24 hours) at 12/18/14 0756 Last data filed at 12/17/14 1933  Gross per 24 hour  Intake    480 ml  Output    785 ml  Net   -305 ml   General: resting in chair Cardiac: RRR, no murmurs  Pulm: clear to auscultation bilaterally Abd: soft, nontender, nondistended, BS present Ext: warm and well perfused, no pedal edema Neuro: alert and oriented  Lab Results: Basic Metabolic Panel:  Recent Labs Lab 12/14/14 1601  12/16/14 0553 12/17/14 0535 12/18/14 0503  NA  --   < > 138 140 137  K  --   < > 4.2 4.3 4.6  CL  --   < > 111 110 106  CO2  --   < > 20* 21* 20*  GLUCOSE  --   < > 75 85 95  BUN  --   < > 25* 28* 32*  CREATININE  --   < > 3.41* 3.32* 3.10*  CALCIUM  --   < > 8.6* 8.8* 8.7*  MG 2.0  --   --   --   --   PHOS  --   --  3.3  --   --   < > = values in this interval not displayed. Liver Function Tests:  Recent Labs Lab 12/14/14 0738 12/16/14 0553  AST 28  --   ALT 19  --   ALKPHOS 56  --   BILITOT 0.8  --   PROT 7.0  --   ALBUMIN 3.7 2.7*    Recent Labs Lab 12/14/14 0738  LIPASE 124*   No results for input(s): AMMONIA in the last 168 hours. CBC:  Recent Labs Lab 12/14/14 0738 12/15/14 0529  WBC 6.2 5.3  NEUTROABS 4.2  --   HGB 13.8 11.0*  HCT 38.1* 31.7*  MCV 87.0 89.3  PLT 127* 115*   Cardiac Enzymes:  Recent Labs Lab 12/14/14 0738  CKTOTAL 41*   Urine Drug Screen: Drugs of Abuse     Component Value Date/Time   LABOPIA NONE DETECTED 12/14/2014 0844    COCAINSCRNUR NONE DETECTED 12/14/2014 0844   LABBENZ NONE DETECTED 12/14/2014 0844   AMPHETMU NONE DETECTED 12/14/2014 0844   THCU NONE DETECTED 12/14/2014 0844   LABBARB NONE DETECTED 12/14/2014 0844    Alcohol Level: No results for input(s): ETH in the last 168 hours. Urinalysis:  Recent Labs Lab 12/12/14 1734 12/14/14 0844  COLORURINE RED* RED*  LABSPEC 1.034* 1.021  PHURINE 5.5 5.5  GLUCOSEU NEGATIVE NEGATIVE  HGBUR LARGE* LARGE*  BILIRUBINUR MODERATE* LARGE*  KETONESUR 15* 15*  PROTEINUR >300* >300*  UROBILINOGEN 0.2 0.2  NITRITE NEGATIVE POSITIVE*  LEUKOCYTESUR SMALL* MODERATE*    Micro Results: Recent Results (from the past 240 hour(s))  Urine culture     Status: None   Collection Time: 12/12/14  5:32 PM  Result Value Ref Range Status   Specimen Description  URINE, CLEAN CATCH  Final   Special Requests NONE  Final   Culture MULTIPLE SPECIES PRESENT, SUGGEST RECOLLECTION  Final   Report Status 12/14/2014 FINAL  Final  Urine culture     Status: None   Collection Time: 12/14/14  8:44 AM  Result Value Ref Range Status   Specimen Description URINE, CLEAN CATCH  Final   Special Requests NONE  Final   Culture NO GROWTH 1 DAY  Final   Report Status 12/15/2014 FINAL  Final   Studies/Results: No results found. Medications: I have reviewed the patient's current medications. Scheduled Meds: . feeding supplement  1 Container Oral TID BM  . levofloxacin  750 mg Oral Q48H   Continuous Infusions:   PRN Meds:.promethazine   Recent Labs Lab 12/15/14 0529 12/15/14 1346 12/16/14 0553 12/17/14 0535 12/18/14 0503  CREATININE 3.31* 3.36* 3.41* 3.32* 3.10*     Assessment/Plan:   Acute kidney injury - Leading differential is IgA nephropathy versus less likely AIN due to Bactrim.   - SCr remains stable trending down slightly, good UOP, 24 hour protein at 1.1 g. - Plan for renal biopsy today -Started on prednisone for likely IgA nephropathy on 8/21 will continue  prednisone  daily  UTI - D/C levaquin as he has had an adaquate course to cover a possible uti.  Hypokalemia -Repeated now potassium wnl.  Dispo: Disposition is deferred at this time, awaiting improvement of current medical problems.  Patient will likely need to stay till at least Tuesday to await results of biopsy.  The patient does have a current PCP (No Pcp Per Patient) and does not know need an [facility] hospital follow-up appointment after discharge.  The patient does not have transportation limitations that hinder transportation to clinic appointments.  .Services Needed at time of discharge: Y = Yes, Blank = No PT:   OT:   RN:   Equipment:   Other:       Gust Rung, DO 12/18/2014, 7:56 AM

## 2014-12-18 NOTE — Progress Notes (Signed)
Subjective: This AM, he is hungry but understands he has to have his biopsy before he can eat.  Objective: Vital signs in last 24 hours: Temp:  [98.2 F (36.8 C)-98.6 F (37 C)] 98.5 F (36.9 C) (08/22 1310) Pulse Rate:  [55-68] 55 (08/22 1310) Resp:  [16-28] 16 (08/22 1310) BP: (119-136)/(82-91) 120/82 mmHg (08/22 1310) SpO2:  [93 %-100 %] 100 % (08/22 1310) Weight change:   Intake/Output from previous day: 08/21 0701 - 08/22 0700 In: 480 [P.O.:480] Out: 785 [Urine:785] Intake/Output this shift: Total I/O In: -  Out: 300 [Urine:300]  General: young Caucasian male, resting in bed, NAD Cardiac: RRR, no rubs, murmurs or gallops Pulm: clear to auscultation bilaterally, no wheezes, rales, or rhonchi Abd: soft, nontender, nondistended, BS present Ext: warm and well perfused, no pedal edema, no rashes Neuro: responds to questions appropriately; moving all extremities freely   Lab Results: No results for input(s): WBC, HGB, HCT, PLT in the last 72 hours. BMET:   Recent Labs  12/17/14 0535 12/18/14 0503  NA 140 137  K 4.3 4.6  CL 110 106  CO2 21* 20*  GLUCOSE 85 95  BUN 28* 32*  CREATININE 3.32* 3.10*  CALCIUM 8.8* 8.7*   Scheduled: . feeding supplement  1 Container Oral TID BM  . fentaNYL      . lidocaine (PF)      . midazolam      . predniSONE  60 mg Oral Q breakfast    Assessment/Plan: #1 Acute kidney injury: Appears more consistent with IgA nephropathy given hematuria noted on prior UA though HSP possible if abdominal symptoms are related to renal process . Creatinine 3.1 today, down from 3.3 yesterday, suggestive of improvement. 785 mL urine output yesterday which is reassuring. 1.2 g noted 24-hour urine collection which is reassuring for no nephrotic range proteinuria. -Follow-up biopsy report -Continue prednisone 60 mg daily     Patel, Rushil 12/18/2014,1:21 PM   I have seen and examined this patient and agree with plan as outlined by Dr. Allena Katz.  Await  renal biopsy.  Scr slowly improving.  Will continue to follow as well as H/H following biopsy today. Shandy Checo A,MD 12/18/2014 1:40 PM

## 2014-12-19 DIAGNOSIS — N179 Acute kidney failure, unspecified: Secondary | ICD-10-CM | POA: Diagnosis not present

## 2014-12-19 LAB — CBC
HEMATOCRIT: 34.5 % — AB (ref 39.0–52.0)
HEMOGLOBIN: 12.2 g/dL — AB (ref 13.0–17.0)
MCH: 30.7 pg (ref 26.0–34.0)
MCHC: 35.4 g/dL (ref 30.0–36.0)
MCV: 86.7 fL (ref 78.0–100.0)
Platelets: 183 10*3/uL (ref 150–400)
RBC: 3.98 MIL/uL — ABNORMAL LOW (ref 4.22–5.81)
RDW: 12 % (ref 11.5–15.5)
WBC: 6.6 10*3/uL (ref 4.0–10.5)

## 2014-12-19 LAB — BASIC METABOLIC PANEL
ANION GAP: 6 (ref 5–15)
BUN: 35 mg/dL — AB (ref 6–20)
CALCIUM: 8.7 mg/dL — AB (ref 8.9–10.3)
CO2: 24 mmol/L (ref 22–32)
Chloride: 108 mmol/L (ref 101–111)
Creatinine, Ser: 3.3 mg/dL — ABNORMAL HIGH (ref 0.61–1.24)
GFR calc Af Amer: 29 mL/min — ABNORMAL LOW (ref >60)
GFR calc non Af Amer: 25 mL/min — ABNORMAL LOW (ref >60)
GLUCOSE: 83 mg/dL (ref 65–99)
Potassium: 3.8 mmol/L (ref 3.5–5.1)
Sodium: 138 mmol/L (ref 135–145)

## 2014-12-19 MED ORDER — PREDNISONE 20 MG PO TABS: 60.0000 mg | ORAL_TABLET | Freq: Every day | ORAL | Status: DC

## 2014-12-19 NOTE — Progress Notes (Signed)
Subjective: No complaints today, ready to go home. No pain or bleeding from biopsy site at the back. Appetite back to baseline.   Objective: Vital signs in last 24 hours: Filed Vitals:   12/18/14 1646 12/18/14 1823 12/18/14 2029 12/19/14 0534  BP: 103/71 106/66 95/55 112/80  Pulse: 81 72 85 55  Temp: 97.8 F (36.6 C) 98 F (36.7 C) 98.3 F (36.8 C) 98 F (36.7 C)  TempSrc: Oral Oral Oral Oral  Resp: Height:      Weight:      SpO2: 100% 99% 99% 100%   Weight change:   Intake/Output Summary (Last 24 hours) at 12/19/14 0915 Last data filed at 12/19/14 0851  Gross per 24 hour  Intake      0 ml  Output    950 ml  Net   -950 ml   General: Lying in bed, NAD, tattoos present, moist oral mucosa Cardiac: RRR, no murmurs  Pulm: Normal work of breathing , No added sounds Abd: soft, nontender, nondistended, BS present Ext: warm and well perfused, no pedal edema, SCDs in place Neuro: alert and oriented, moving all extremities  Lab Results: Basic Metabolic Panel:  Recent Labs Lab 12/14/14 1601  12/16/14 0553  12/18/14 0503 12/19/14 0647  NA  --   < > 138  < > 137 138  K  --   < > 4.2  < > 4.6 3.8  CL  --   < > 111  < > 106 108  CO2  --   < > 20*  < > 20* 24  GLUCOSE  --   < > 75  < > 95 83  BUN  --   < > 25*  < > 32* 35*  CREATININE  --   < > 3.41*  < > 3.10* 3.30*  CALCIUM  --   < > 8.6*  < > 8.7* 8.7*  MG 2.0  --   --   --   --   --   PHOS  --   --  3.3  --   --   --   < > = values in this interval not displayed. Liver Function Tests:  Recent Labs Lab 12/14/14 0738 12/16/14 0553  AST 28  --   ALT 19  --   ALKPHOS 56  --   BILITOT 0.8  --   PROT 7.0  --   ALBUMIN 3.7 2.7*    Recent Labs Lab 12/14/14 0738  LIPASE 124*   CBC:  Recent Labs Lab 12/14/14 0738  12/18/14 1603 12/19/14 0647  WBC 6.2  < > 7.9 6.6  NEUTROABS 4.2  --   --   --   HGB 13.8  < > 12.3* 12.2*  HCT 38.1*  < > 34.0* 34.5*  MCV 87.0  < > 86.1 86.7  PLT 127*  < >  192 183  < > = values in this interval not displayed. Cardiac Enzymes:  Recent Labs Lab 12/14/14 0738  CKTOTAL 41*   Urine Drug Screen: Drugs of Abuse     Component Value Date/Time   LABOPIA NONE DETECTED 12/14/2014 0844   COCAINSCRNUR NONE DETECTED 12/14/2014 0844   LABBENZ NONE DETECTED 12/14/2014 0844   AMPHETMU NONE DETECTED 12/14/2014 0844   THCU NONE DETECTED 12/14/2014 0844   LABBARB NONE DETECTED 12/14/2014 0844    Urinalysis:  Recent Labs Lab 12/12/14 1734 12/14/14 0844  COLORURINE RED* RED*  LABSPEC 1.034* 1.021  PHURINE 5.5 5.5  GLUCOSEU NEGATIVE NEGATIVE  HGBUR LARGE* LARGE*  BILIRUBINUR MODERATE* LARGE*  KETONESUR 15* 15*  PROTEINUR >300* >300*  UROBILINOGEN 0.2 0.2  NITRITE NEGATIVE POSITIVE*  LEUKOCYTESUR SMALL* MODERATE*    Micro Results: Recent Results (from the past 240 hour(s))  Urine culture     Status: None   Collection Time: 12/12/14  5:32 PM  Result Value Ref Range Status   Specimen Description URINE, CLEAN CATCH  Final   Special Requests NONE  Final   Culture MULTIPLE SPECIES PRESENT, SUGGEST RECOLLECTION  Final   Report Status 12/14/2014 FINAL  Final  Urine culture     Status: None   Collection Time: 12/14/14  8:44 AM  Result Value Ref Range Status   Specimen Description URINE, CLEAN CATCH  Final   Special Requests NONE  Final   Culture NO GROWTH 1 DAY  Final   Report Status 12/15/2014 FINAL  Final   Studies/Results: US Biopsy  12/18/2014   CLINICAL DATA:  Acute renal injury, hematuria.  COMPARISON:  CT 12/12/2014  EXAM: ULTRASOUND GUIDED RENAL CORE BIOPSY  TECHNIQUE: The procedure, risks (including but not limited to bleeding, infection, organ damage ), benefits, and alternatives were explained to the patient. Questions regarding the procedure were encouraged and answered. The patient understands and consents to the procedure. Survey ultrasound was performed and an appropriate skin entry site was localized. Site was marked, prepped  with chlorhexidine, draped in usual sterile fashion, infiltrated locally with 1% lidocaine. Intravenous Fentanyl and Versed were administered as conscious sedation during continuous cardiorespiratory monitoring by the radiology RN with a total moderate sedation time of 10 minutes.  Under real time ultrasound guidance, a 15 gauge trocar needle was advanced to the margin of the lower pole left kidney. Coaxially a 16 gauge core needle was advanced for 3 passes. The core samples were submitted to pathology. The patient tolerated procedure well. Postprocedure scans show no hemorrhage or other apparent complication.  COMPLICATIONS: COMPLICATIONS none  IMPRESSION: 1. Technically successful ultrasound-guided core renal biopsy , left lower pole.   Electronically Signed   By: Corlis Leak M.D.   On: 12/18/2014 15:26   Medications: I have reviewed the patient's current medications. Scheduled Meds: . feeding supplement  1 Container Oral TID BM  . predniSONE  60 mg Oral Q breakfast   Continuous Infusions:   PRN Meds:.promethazine   Recent Labs Lab 12/15/14 1346 12/16/14 0553 12/17/14 0535 12/18/14 0503 12/19/14 0647  CREATININE 3.36* 3.41* 3.32* 3.10* 3.30*   Assessment/Plan:  Acute kidney injury- Cr stable at 3.3 today, BUN- 35. Differentials include IgA nephropathy, also considered is bactrim induced interstitial nephritis. Urine output- over the past 24hrs. - Renal biopsy done- 12/18/2014 - Presently on prednisone- 60mg  daily, will need to clarify Dose, duration, taper and follow up with nephrology - D/c home today - Follow up in [facility] clinic, patient will prefer to come in Monday- 01/25/2015 also follow up with nephrology - Bmet in follow up  UTI - D/C levaquin as he has had an adaquate course to cover a possible uti.  Hypokalemia- Resolved.  Dispo: Disposition is deferred at this time, awaiting improvement of current medical problems.   The patient does not have a current PCP (No Pcp Per  Patient) and does need an [facility] hospital follow-up appointment after discharge.  The patient does not know have transportation limitations that hinder transportation to clinic appointments.  .Services Needed at time of discharge: Y = Yes, Blank = No PT:  OT:   RN:   Equipment:   Other:       Onnie Boer, MD 12/19/2014, 9:15 AM

## 2014-12-19 NOTE — Progress Notes (Signed)
Subjective: This AM, he reports feeling well and is tolerating PO. Would like to go home.  Objective: Vital signs in last 24 hours: Temp:  [97.8 F (36.6 C)-98.8 F (37.1 C)] 98 F (36.7 C) (08/23 0534) Pulse Rate:  [55-93] 55 (08/23 0534) Resp:  [16-28] 18 (08/23 0534) BP: (95-136)/(55-91) 112/80 mmHg (08/23 0534) SpO2:  [93 %-100 %] 100 % (08/23 0534) Weight change:   Intake/Output from previous day: 08/22 0701 - 08/23 0700 In: -  Out: 850 [Urine:850] Intake/Output this shift:    General: young Caucasian male, resting in bed, NAD Cardiac: RRR, no rubs, murmurs or gallops Pulm: clear to auscultation bilaterally, no wheezes, rales, or rhonchi Abd: soft, nontender, nondistended, BS present Ext: warm and well perfused, no pedal edema, no rashes Neuro: responds to questions appropriately; moving all extremities freely   Lab Results:  Recent Labs  12/18/14 1603 12/19/14 0647  WBC 7.9 6.6  HGB 12.3* 12.2*  HCT 34.0* 34.5*  PLT 192 183   BMET:   Recent Labs  12/18/14 0503 12/19/14 0647  NA 137 138  K 4.6 3.8  CL 106 108  CO2 20* 24  GLUCOSE 95 83  BUN 32* 35*  CREATININE 3.10* 3.30*  CALCIUM 8.7* 8.7*   Scheduled: . feeding supplement  1 Container Oral TID BM  . predniSONE  60 mg Oral Q breakfast    Assessment/Plan: #1 Acute kidney injury: Appears more consistent with IgA nephropathy given hematuria noted on prior UA though HSP possible if abdominal symptoms are related to renal process . Labs pending this AM. 850 mL urine output yesterday.  -Follow-up biopsy report and will need appointment to review results -Continue prednisone 60 mg daily     Patel, Rushil 12/19/2014,8:57 AM   I have seen and examined this patient and agree with plan as outlined by Dr. Allena Katz.  Pt will follow up with Dr. Lowell Guitar after his biopsy results are reviewed.  Continue with prednisone at 60 mg for the next week and f/u with Dr. Lowell Guitar for taper instructions and review of  biopsy.  Stable for discharge from a renal standpoint. Haidy Kackley A,MD 12/19/2014 12:55 PM

## 2014-12-19 NOTE — Discharge Instructions (Signed)
Acute Kidney Injury Acute kidney injury is a disease in which there is sudden (acute) damage to the kidneys. The kidneys are 2 organs that lie on either side of the spine between the middle of the back and the front of the abdomen. The kidneys:  Remove wastes and extra water from the blood.   Produce important hormones. These help keep bones strong, regulate blood pressure, and help create red blood cells.   Balance the fluids and chemicals in the blood and tissues. A small amount of kidney damage may not cause problems, but a large amount of damage may make it difficult or impossible for the kidneys to work the way they should. Acute kidney injury may develop into long-lasting (chronic) kidney disease. It may also develop into a life-threatening disease called end-stage kidney disease. Acute kidney injury can get worse very quickly, so it should be treated right away. Early treatment may prevent other kidney diseases from developing.  CAUSES   A problem with blood flow to the kidneys. This may be caused by:   Blood loss.   Heart disease.   Severe burns.   Liver disease.  Direct damage to the kidneys. This may be caused by:  Some medicines.   A kidney infection.   Poisoning or consuming toxic substances.   A surgical wound.   A blow to the kidney area.   A problem with urine flow. This may be caused by:   Cancer.   Kidney stones.   An enlarged prostate. SYMPTOMS   Swelling (edema) of the legs, ankles, or feet.   Tiredness (lethargy).   Nausea or vomiting.   Confusion.   Problems with urination, such as:   Painful or burning feeling during urination.   Decreased urine production.   Frequent accidents in children who are potty trained.   Bloody urine.   Muscle twitches and cramps.   Shortness of breath.   Seizures.   Chest pain or pressure. Sometimes, no symptoms are present. DIAGNOSIS Acute kidney injury may be detected  and diagnosed by tests, including blood, urine, imaging, or kidney biopsy tests.  TREATMENT Treatment of acute kidney injury varies depending on the cause and severity of the kidney damage. In mild cases, no treatment may be needed. The kidneys may heal on their own. If acute kidney injury is more severe, your caregiver will treat the cause of the kidney damage, help the kidneys heal, and prevent complications from occurring. Severe cases may require a procedure to remove toxic wastes from the body (dialysis) or surgery to repair kidney damage. Surgery may involve:   Repair of a torn kidney.   Removal of an obstruction. Most of the time, you will need to stay overnight at the hospital.  HOME CARE INSTRUCTIONS:  Follow your prescribed diet.  Only take over-the-counter or prescription medicines as directed by your caregiver.  Do not take any new medicines (prescription, over-the-counter, or nutritional supplements) unless approved by your caregiver. Many medicines can worsen your kidney damage or need to have the dose adjusted.   Keep all follow-up appointments as directed by your caregiver.  Observe your condition to make sure you are healing as expected. SEEK IMMEDIATE MEDICAL CARE IF:  You are feeling ill or have severe pain in the back or side.   Your symptoms return or you have new symptoms.  You have any symptoms of end-stage kidney disease. These include:   Persistent itchiness.   Loss of appetite.   Headaches.   Abnormally dark  or light skin.  Numbness in the hands or feet.   Easy bruising.   Frequent hiccups.   Menstruation stops.   You have a fever.  You have increased urine production.  You have pain or bleeding when urinating. MAKE SURE YOU:   Understand these instructions.  Will watch your condition.  Will get help right away if you are not doing well or get worse

## 2014-12-19 NOTE — Progress Notes (Signed)
Patient discharge teaching given, including activity, diet, follow-up appoints, and medications. Patient verbalized understanding of all discharge instructions. IV access was d/c'd. Vitals are stable. Skin is intact except as charted in most recent assessments. Pt to be escorted out by NT, to be driven home by family. 

## 2014-12-19 NOTE — Progress Notes (Signed)
Pt was not given his prednisone dose yesterday at 10AM due to his impending procedure (NPO), but it was not given on his return from procedure so he did not get yesterday's dose at all. Dose given this AM.

## 2014-12-19 NOTE — Discharge Summary (Signed)
Name: Johnny Conner MRN: [id number] DOB: Oct 06, 1995 19 y.o. PCP: No Pcp Per Patient  Date of Admission: 12/14/2014  7:24 AM Date of Discharge: 12/19/2014 Attending Physician: Tyson Alias, MD  Discharge Diagnosis: Principal Problem:   Acute kidney injury Active Problems:   Vomiting   UTI (lower urinary tract infection)   Hematuria   AKI (acute kidney injury)   Hematuria, undiagnosed cause  Discharge Medications:   Medication List    STOP taking these medications        ondansetron 4 MG disintegrating tablet  Commonly known as:  ZOFRAN ODT     ondansetron 4 MG tablet  Commonly known as:  ZOFRAN     sulfamethoxazole-trimethoprim 800-160 MG per tablet  Commonly known as:  BACTRIM DS,SEPTRA DS      TAKE these medications        predniSONE 20 MG tablet  Commonly known as:  DELTASONE  Take 3 tablets (60 mg total) by mouth daily with breakfast.        Disposition and follow-up:   Mr.Johnny Conner was discharged from [facility] in Stable condition.  At the hospital follow up visit please address:  1.  Please follow up results of Renal biopsy, ensure patient has scheduled follow up with nephrology as they will dictate steroid taper after the first 2 weeks of 60mg  of prednisone.  2.  Labs / imaging needed at time of follow-up: BMP  3.  Pending labs/ test needing follow-up: Renal Biopsy  Follow-up Appointments: Follow-up Information    Follow up with Valentino Nose, MD. Go on 12/25/2014.   Specialty:  Internal Medicine   Why:  At 8.45am   Contact information:   [address] [place] [address] [phone]       Discharge Instructions: Discharge Instructions    Diet - low sodium heart healthy    Complete by:  As directed      Discharge instructions    Complete by:  As directed   We will be discharging you to follow up with [place] in our clinic in the hospital. We will be following up on your kidney numbers that tell us how  your kidney is doing. Your appointment is for 8.45 am on Monday. Please if you really cannot keep this appointment call and reschedule.    Also you will need to follow up with your kidney doctor.   We will be prescribing a medication called prednisone for you, which is a steroid. Take 60mg  everyday. We will be prescribing a 2 week supply. We will let you know when you follow up with your kidney doctors how long you will need this medication, and if you need a refill.     Discharge patient    Complete by:  As directed      Increase activity slowly    Complete by:  As directed            Consultations: Treatment Team:  Lauris Poag, MD  Procedures Performed:  US Biopsy  12/18/2014   CLINICAL DATA:  Acute renal injury, hematuria.  COMPARISON:  CT 12/12/2014  EXAM: ULTRASOUND GUIDED RENAL CORE BIOPSY  TECHNIQUE: The procedure, risks (including but not limited to bleeding, infection, organ damage ), benefits, and alternatives were explained to the patient. Questions regarding the procedure were encouraged and answered. The patient understands and consents to the procedure. Survey ultrasound was performed and an appropriate skin entry site was localized. Site was marked, prepped  with chlorhexidine, draped in usual sterile fashion, infiltrated locally with 1% lidocaine. Intravenous Fentanyl and Versed were administered as conscious sedation during continuous cardiorespiratory monitoring by the radiology RN with a total moderate sedation time of 10 minutes.  Under real time ultrasound guidance, a 15 gauge trocar needle was advanced to the margin of the lower pole left kidney. Coaxially a 16 gauge core needle was advanced for 3 passes. The core samples were submitted to pathology. The patient tolerated procedure well. Postprocedure scans show no hemorrhage or other apparent complication.  COMPLICATIONS: COMPLICATIONS none  IMPRESSION: 1. Technically successful ultrasound-guided core renal biopsy , left  lower pole.   Electronically Signed   By: Corlis Leak M.D.   On: 12/18/2014 15:26   Ct Renal Stone Study  12/12/2014   CLINICAL DATA:  Vomiting for 2 days, kidney infection  EXAM: CT ABDOMEN AND PELVIS WITHOUT CONTRAST  TECHNIQUE: Multidetector CT imaging of the abdomen and pelvis was performed following the standard protocol without IV contrast.  COMPARISON:  06/30/2014  FINDINGS: Lung bases are unremarkable. Sagittal images of the spine are unremarkable.  Unenhanced liver, pancreas, spleen and adrenal glands are unremarkable. No calcified gallstones are noted within gallbladder. No aortic aneurysm. Unenhanced kidneys are symmetrical in size. No nephrolithiasis. No hydronephrosis or hydroureter.  No aortic aneurysm.  There is no pericecal inflammation.  Normal appendix partially visualized in coronal image 36. Mild levoscoliosis lumbar spine.  Bilateral distal ureter is unremarkable. No calcified ureteral calculi. There is mild thickening of urinary bladder wall. Mild cystitis cannot be excluded. Prostate gland is unremarkable.  No small bowel obstruction. No ascites or free air. No adenopathy. Again noted high-density exophytic cyst in left kidney stable in size in appearance from prior exam measures about 5 mm. Right renal cyst again noted measures 1.7 cm.  IMPRESSION: 1. There is no nephrolithiasis.  No hydronephrosis or hydroureter. 2. Stable bilateral renal cysts. 3.  No calcified ureteral calculi. 4. Normal appendix partially visualized. 5. Mild thickening of urinary bladder wall. Cystitis cannot be excluded. Clinical correlation is necessary.   Electronically Signed   By: Natasha Mead M.D.   On: 12/12/2014 17:11    Admission HPI: 54 Y O M with no significant PMG except for tobacco abuse, presented to the ED with c/o diarrhea, vomiting and poor appetite that started on Tuesday. Pt initially presented to the ED- 12/12/2014 with flank pain, urinary frequency, chills, fever and vomiting. He was diagnosed with  a UTI and started on bactrim with zofran. He went home took this medications, but says that the vomiting became worse, after starting the bactrim, also started having diarrhea also. He endorses vomiting 4-5 times a day, with occasional streaks of blood, also with diarrhea- 2 episodes, watery, non bloody, last bowel movement yesterday. Since his Ed visit 2 days ago, he has only been able to take water and gatorade, he has not been able to eat. He has not had an recurring chills or fever, flank pain has resolved, he never had dysuria. Last sexual encounter was > 6 months ago. He has some mild soreness in his lower abdomen, feels he has to void frequently but says little or sometimes no urine comes out. He says he has taken only 2 pills of OTC ibuprofen- yesterday. Patient has had prior episodes of Urinary tract infection- Kidney infection when he was 19 yrs old. He says his paternal grandmother has an issue with her kidney, he dose not know what it is.  Hospital Course  by problem list:   Acute kidney injury with Hematuria Patient presented with AKI with a SCr that had increased from 1.2 in the ED on 8/16 to 2.8 on 8/18.  Initially there was concern for dehydration (from vomiting),  AIN (from bactrim), or autoimmune disease.   He was given IVF resuscitation yet his renal function continued to worsen.  His autoimmune workup was negative.  He had no evidence of eosinophils in his urine.  His history was reexamined and given his previous episode of abdominal pain and hematuria as well as frequent sinus infections we became more concerned for an IgA nephropathy.  We consulted Nephrology who agreed with a renal biopsy (this was completed on 12/18/14).  He was empirically started on 60mg  of prednisone with improvement in his nausea.  His renal function remained stable at a creatinine of around 3.3 during his hospitalization.  He will follow up with nephrology within 2 weeks and will remain on 60mg  of prednisone until  that time.  He has follow up in the [facility] on 8/29 with Dr Karma Greaser.    UTI (lower urinary tract infection) He was diagnosed with a UTI at his previous ED visit and was placed on bactrim.  This was changed to Levaquin (renally dosed) while inpatient.  He completed a 7 day course, his original urine culture showed multiple species present and repeat was negative.  Discharge Vitals:   BP 113/74 mmHg  Pulse 74  Temp(Src) 97.6 F (36.4 C) (Oral)  Resp 16  Ht 5\' 9"  (1.753 m)  Wt 151 lb 8 oz (68.72 kg)  BMI 22.36 kg/m2  SpO2 99%  Discharge Labs:  Results for orders placed or performed during the hospital encounter of 12/14/14 (from the past 24 hour(s))  CBC     Status: Abnormal   Collection Time: 12/18/14  4:03 PM  Result Value Ref Range   WBC 7.9 4.0 - 10.5 K/uL   RBC 3.95 (L) 4.22 - 5.81 MIL/uL   Hemoglobin 12.3 (L) 13.0 - 17.0 g/dL   HCT 40.9 (L) 81.1 - 91.4 %   MCV 86.1 78.0 - 100.0 fL   MCH 31.1 26.0 - 34.0 pg   MCHC 36.2 (H) 30.0 - 36.0 g/dL   RDW 78.2 95.6 - 21.3 %   Platelets 192 150 - 400 K/uL  Basic metabolic panel     Status: Abnormal   Collection Time: 12/19/14  6:47 AM  Result Value Ref Range   Sodium 138 135 - 145 mmol/L   Potassium 3.8 3.5 - 5.1 mmol/L   Chloride 108 101 - 111 mmol/L   CO2 24 22 - 32 mmol/L   Glucose, Bld 83 65 - 99 mg/dL   BUN 35 (H) 6 - 20 mg/dL   Creatinine, Ser 0.86 (H) 0.61 - 1.24 mg/dL   Calcium 8.7 (L) 8.9 - 10.3 mg/dL   GFR calc non Af Amer 25 (L) >60 mL/min   GFR calc Af Amer 29 (L) >60 mL/min   Anion gap 6 5 - 15  CBC     Status: Abnormal   Collection Time: 12/19/14  6:47 AM  Result Value Ref Range   WBC 6.6 4.0 - 10.5 K/uL   RBC 3.98 (L) 4.22 - 5.81 MIL/uL   Hemoglobin 12.2 (L) 13.0 - 17.0 g/dL   HCT 57.8 (L) 46.9 - 62.9 %   MCV 86.7 78.0 - 100.0 fL   MCH 30.7 26.0 - 34.0 pg   MCHC 35.4 30.0 - 36.0 g/dL  RDW 12.0 11.5 - 15.5 %   Platelets 183 150 - 400 K/uL    Signed: Gust Rung, DO 12/20/2014, 11:43 AM    Services  Ordered on Discharge: None Equipment Ordered on Discharge: None

## 2014-12-25 ENCOUNTER — Ambulatory Visit (INDEPENDENT_AMBULATORY_CARE_PROVIDER_SITE_OTHER): Payer: BLUE CROSS/BLUE SHIELD | Admitting: Internal Medicine

## 2014-12-25 ENCOUNTER — Encounter (HOSPITAL_COMMUNITY): Payer: Self-pay

## 2014-12-25 VITALS — BP 129/89 | HR 71 | Temp 98.0°F | Ht 69.0 in | Wt 149.4 lb

## 2014-12-25 DIAGNOSIS — N028 Recurrent and persistent hematuria with other morphologic changes: Secondary | ICD-10-CM | POA: Diagnosis not present

## 2014-12-25 DIAGNOSIS — N02B9 Other recurrent and persistent immunoglobulin A nephropathy: Secondary | ICD-10-CM

## 2014-12-25 HISTORY — DX: Recurrent and persistent hematuria with other morphologic changes: N02.8

## 2014-12-25 HISTORY — DX: Other recurrent and persistent immunoglobulin A nephropathy: N02.B9

## 2014-12-25 MED ORDER — PREDNISONE 20 MG PO TABS: 60.0000 mg | ORAL_TABLET | Freq: Every day | ORAL | Status: DC

## 2014-12-25 NOTE — Progress Notes (Signed)
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 Patient on prednisone, nephrology appointment on 14th September. BMP planned for today. We will arrange to get notes from [facility] to follow up on patient's progress. Patient seen with Dr Karma Greaser.

## 2014-12-25 NOTE — Assessment & Plan Note (Signed)
Patient was recently admitted to the hospital on 8/18 with diarrhea, nausea and vomiting and found to have an AKI. He had initially presented to the ED on 8/16 with flank pain, urinary frequency, fever, chills and vomiting. He was diagnosed with a UTI at that time and started on Zofran and Bactrim. After visiting the ED he developed watery, non-bloody diarrhea though did have improvement in his fever, chills and flank pain. He never had any dysuria.   In the hospital, his Cr was 2.8 from 1.2 prior. He was given IVF and his renal function continued to deteriorate. AIN vs autoimmune was considered. Autoimmune work up was negative and had no evidence of eosinophils in his urine. Further history was elicited and it was found that he had frequent sinus infections along with a previous episode of hematuria and abdominal pain, concerning for IgA nephropathy. Nephrology was consulted and renal biopsy was done. Biopsy results confirmed IgA nephropathy. He was discharged on 60 mg Prednisone daily until follow up with Nephrology.   Today in clinic, he reports his symptoms have resolved. Denies any N/V/D, hematuria, flank pain, dysuria, abdominal pain. Has been compliant with the Prednisone but reports he ran out yesterday. It appears the prescription only gave 5 days worth of Prednisone. He does complain of some elbow and knee tightness' today. Whenever he is active and rests afterwards, his elbows and knees become stiff and he has to stretch them out. Denies any pain or tenderness with these episodes. States it feels like he need to pop his joints.' Symptoms are worst in the morning when he wakes up and are improved with the Prednisone. Will monitor for now. If symptoms progress or are persistent consider additional work up. Unlikely 2/2 steroid use for <1 week. Could be arthritis 2/2 his IgA disease.   - BMP today  - Follow up appointment with Dr. Lowell Guitar on 9/14 - Prednisone 60 mg daily, follow up Dr. Roanna Banning  recs - Follow up in clinic in 3 months

## 2014-12-25 NOTE — Progress Notes (Signed)
Patient ID: ELZIE KNISLEY, male   DOB: [birth year], 19 y.o.   MRN: [id number]   Subjective:   Patient ID: JUN OSMENT male   DOB: 04/09/1996 19 y.o.   MRN: [id number]  HPI: Mr.Jakarri CASE VASSELL is a 19 y.o. male with a PMH of IgA nephropathy here today for hospital follow-up.   For details of today's visit please refer to problem based charting.   Past Medical History  Diagnosis Date  . Pyelonephritis 2012    treated with 3 days of unspecified abx   Current Outpatient Prescriptions  Medication Sig Dispense Refill  . predniSONE (DELTASONE) 20 MG tablet Take 3 tablets (60 mg total) by mouth daily with breakfast. 51 tablet 0   No current facility-administered medications for this visit.   No family history on file. Social History   Social History  . Marital Status: Single    Spouse Name: N/A  . Number of Children: N/A  . Years of Education: N/A   Social History Main Topics  . Smoking status: Current Every Day Smoker -- 0.10 packs/day for 1 years    Types: Cigarettes  . Smokeless tobacco: Never Used  . Alcohol Use: 0.0 - 0.6 oz/week    0-1 Standard drinks or equivalent per week  . Drug Use: No  . Sexual Activity: Not Currently     Comment: Last sexual experience 7-8 m ago, patient had a negative STI test since then   Other Topics Concern  . Not on file   Social History Narrative   Review of Systems: Review of Systems  Constitutional: Negative for fever and chills.  Respiratory: Negative for shortness of breath.   Cardiovascular: Negative for chest pain.  Gastrointestinal: Negative for nausea, vomiting, abdominal pain, diarrhea, constipation and blood in stool.  Genitourinary: Negative for dysuria, urgency, frequency, hematuria and flank pain.  Musculoskeletal: Positive for joint pain. Negative for myalgias.  Skin: Negative for rash.  Neurological: Negative for headaches.   Objective:  Physical Exam: Filed Vitals:   12/25/14 0908  BP: 129/89  Pulse: 71  Temp:  98 F (36.7 C)  TempSrc: Oral  Height:  (1.753 m)  Weight: 149 lb 6.4 oz (67.767 kg)  SpO2: 100%   GENERAL- alert, co-operative, appears as stated age, not in any distress. HEENT- Atraumatic, normocephalic, PERRL, EOMI, oral mucosa appears moist, good and intact dentition. CARDIAC- RRR, no murmurs, rubs or gallops. RESP- Moving equal volumes of air, and clear to auscultation bilaterally, no wheezes or crackles. ABDOMEN- Soft, nontender, no guarding or rebound, no palpable masses or organomegaly, bowel sounds present. BACK- Normal curvature of the spine, No tenderness along the vertebrae, no CVA tenderness. NEURO- No obvious Cr N abnormality EXTREMITIES- pulse 2+, symmetric, no pedal edema. SKIN- Warm, dry, No rash or lesion. PSYCH- Normal mood and affect, appropriate thought content and speech.  Assessment & Plan:   Case discussed with Dr. Dalphine Handing. Please refer to Problem based carting for further details of today's visit.

## 2014-12-25 NOTE — Patient Instructions (Signed)
Thank you for coming in today.  - Please keep your follow up appointment with Dr. Lowell Guitar on 9/14 - I have given you a refill on your Prednisone which should last you until your next appointment. If you have any issues please call the clinic.  - Follow up in 3 months in clinic

## 2014-12-26 LAB — BASIC METABOLIC PANEL
BUN / CREAT RATIO: 17 (ref 8–19)
BUN: 34 mg/dL — ABNORMAL HIGH (ref 6–20)
CALCIUM: 9 mg/dL (ref 8.7–10.2)
CHLORIDE: 103 mmol/L (ref 97–108)
CO2: 23 mmol/L (ref 18–29)
Creatinine, Ser: 2.04 mg/dL — ABNORMAL HIGH (ref 0.76–1.27)
GFR calc non Af Amer: 46 mL/min/{1.73_m2} — ABNORMAL LOW (ref >59)
GFR, EST AFRICAN AMERICAN: 53 mL/min/{1.73_m2} — AB (ref >59)
Glucose: 74 mg/dL (ref 65–99)
POTASSIUM: 3.8 mmol/L (ref 3.5–5.2)
SODIUM: 143 mmol/L (ref 134–144)

## 2015-01-02 ENCOUNTER — Encounter: Payer: Self-pay | Admitting: Internal Medicine

## 2015-01-02 ENCOUNTER — Encounter (HOSPITAL_COMMUNITY): Payer: Self-pay

## 2015-01-02 ENCOUNTER — Ambulatory Visit (INDEPENDENT_AMBULATORY_CARE_PROVIDER_SITE_OTHER): Payer: BLUE CROSS/BLUE SHIELD | Admitting: Internal Medicine

## 2015-01-02 VITALS — BP 118/81 | HR 96 | Temp 98.8°F | Ht 69.0 in | Wt 149.9 lb

## 2015-01-02 DIAGNOSIS — N028 Recurrent and persistent hematuria with other morphologic changes: Secondary | ICD-10-CM | POA: Diagnosis not present

## 2015-01-02 LAB — COMPREHENSIVE METABOLIC PANEL
ALT: 36 U/L (ref 17–63)
ANION GAP: 7 (ref 5–15)
AST: 21 U/L (ref 15–41)
Albumin: 3.6 g/dL (ref 3.5–5.0)
Alkaline Phosphatase: 67 U/L (ref 38–126)
BILIRUBIN TOTAL: 0.6 mg/dL (ref 0.3–1.2)
BUN: 21 mg/dL — AB (ref 6–20)
CHLORIDE: 102 mmol/L (ref 101–111)
CO2: 29 mmol/L (ref 22–32)
Calcium: 9.3 mg/dL (ref 8.9–10.3)
Creatinine, Ser: 1.69 mg/dL — ABNORMAL HIGH (ref 0.61–1.24)
GFR, EST NON AFRICAN AMERICAN: 57 mL/min — AB (ref >60)
Glucose, Bld: 91 mg/dL (ref 65–99)
POTASSIUM: 4.4 mmol/L (ref 3.5–5.1)
Sodium: 138 mmol/L (ref 135–145)
TOTAL PROTEIN: 5.8 g/dL — AB (ref 6.5–8.1)

## 2015-01-02 LAB — CBC WITH DIFFERENTIAL/PLATELET
BASOS ABS: 0 10*3/uL (ref 0.0–0.1)
BASOS PCT: 0 % (ref 0–1)
EOS PCT: 0 % (ref 0–5)
Eosinophils Absolute: 0 10*3/uL (ref 0.0–0.7)
HEMATOCRIT: 37.9 % — AB (ref 39.0–52.0)
Hemoglobin: 12.5 g/dL — ABNORMAL LOW (ref 13.0–17.0)
LYMPHS PCT: 5 % — AB (ref 12–46)
Lymphs Abs: 0.6 10*3/uL — ABNORMAL LOW (ref 0.7–4.0)
MCH: 30.6 pg (ref 26.0–34.0)
MCHC: 33 g/dL (ref 30.0–36.0)
MCV: 92.7 fL (ref 78.0–100.0)
MONO ABS: 0.2 10*3/uL (ref 0.1–1.0)
MONOS PCT: 2 % — AB (ref 3–12)
Neutro Abs: 11 10*3/uL — ABNORMAL HIGH (ref 1.7–7.7)
Neutrophils Relative %: 93 % — ABNORMAL HIGH (ref 43–77)
PLATELETS: 207 10*3/uL (ref 150–400)
RBC: 4.09 MIL/uL — ABNORMAL LOW (ref 4.22–5.81)
RDW: 12.8 % (ref 11.5–15.5)
WBC: 11.8 10*3/uL — ABNORMAL HIGH (ref 4.0–10.5)

## 2015-01-02 NOTE — Assessment & Plan Note (Addendum)
Given the patient's symptoms of "tea-colored" urine and low back pain within the CVA region bilaterally and hx of proteinuria > 1g/day, I am concerned about progression of his IgA nephropathy vs an acute flare. He has been compliant with his Prednisone 60 mg daily. He is currently not on an ACE inhibitor. This was likely not started in the hospital due to his AKI.  - Check CBC - see if losing significant blood in urine>> Hgb stable at 12.5. WBC count elevated at 11.8.  - Check CMET- if Cr significantly elevated patient will need to be admitted to the hospital with stat renal consult. Also need to evaluate LFTs as bilirubin could be cause for "tea-colored" urine>> Cr 1.69, better than previously. TBili normal. Awaiting UA results. Patient did not have any urinary symptoms but if does have UTI per UA will need to treat.  - Check UA- rule out UTI - Patient contacted by phone and given results. Will call tomorrow with UA results. Instructed to continue Prednisone 60 mg daily. - Nurse attempted to call [facility] to get patient in sooner than 9/19 but no answer, message left

## 2015-01-02 NOTE — Patient Instructions (Signed)
-   We will get labs. I will call you if you need to come into the hospital - You can take Tylenol for pain. Avoid ibuprofen, advil, aleve.   General Instructions:   Please bring your medicines with you each time you come to clinic.  Medicines may include prescription medications, over-the-counter medications, herbal remedies, eye drops, vitamins, or other pills.   Progress Toward Treatment Goals:  No flowsheet data found.  Self Care Goals & Plans:  No flowsheet data found.  No flowsheet data found.   Care Management & Community Referrals:  No flowsheet data found.

## 2015-01-02 NOTE — Progress Notes (Signed)
   Subjective:    Patient ID: Johnny Conner, male    DOB: [birth year], 19 y.o.   MRN: [id number]  HPI Mr. Ferrall is a [place] man with history of recently diagnosed IgA nephropathy who presents today for an acute visit.  Patient reports he noticed his urine appeared "tea-colored" this morning and had been instructed to come to clinic if he noticed any changes in his urine. He also reports mild low back bilaterally and abdominal cramping that started last night. He describes the back pain as 3/10 in severity, right side slightly worse than left, dull and more of a discomfort than pain, and worse when lying down. He describes his abdominal cramping as more of a pressure than pain. He states he just feels uncomfortable. He also reports chills and feeling fatigued. He reports compliance with his Prednisone 60 mg daily. He denies any fevers, dysuria, frank blood in the urine, or urinary frequency.    Review of Systems General: Denies night sweats, changes in weight, changes in appetite HEENT: Denies headaches, ear pain, changes in vision, rhinorrhea, sore throat CV: Denies CP, palpitations, SOB, orthopnea Pulm: Denies SOB, cough, wheezing GI: Denies nausea, vomiting, diarrhea, constipation, melena, hematochezia GU: See HPI Msk: Denies muscle cramps Neuro: Denies weakness, numbness, tingling Skin: Denies rashes, bruising    Objective:   Physical Exam General: alert, sitting up, pleasant, NAD HEENT: [place]/AT, EOMI, sclera anicteric, mucus membranes moist CV: RRR, no m/g/r Pulm: CTA bilaterally, breaths non-labored Abd: BS+, soft, non-tender, non-distended Back: No tenderness to palpation Ext: warm, no peripheral edema Neuro: alert and oriented x 3    Assessment & Plan:  Please refer to A&P documentation.

## 2015-01-03 ENCOUNTER — Telehealth: Payer: Self-pay | Admitting: Internal Medicine

## 2015-01-03 LAB — URINALYSIS, ROUTINE W REFLEX MICROSCOPIC
Bilirubin, UA: NEGATIVE
Glucose, UA: NEGATIVE
Ketones, UA: NEGATIVE
NITRITE UA: NEGATIVE
SPEC GRAV UA: 1.017 (ref 1.005–1.030)
UUROB: 0.2 mg/dL (ref 0.2–1.0)
pH, UA: 7.5 (ref 5.0–7.5)

## 2015-01-03 LAB — MICROSCOPIC EXAMINATION
CASTS: NONE SEEN /LPF
Epithelial Cells (non renal): NONE SEEN /hpf (ref 0–10)
RBC, UA: >30 /hpf — AB (ref 0–?)

## 2015-01-03 NOTE — Telephone Encounter (Signed)
Called patient with UA results. Advised to return to clinic if symptoms worsen or do not improve, or report to the emergency room if necessary. Will try to get patient in earlier to see Nephrology.

## 2015-01-04 ENCOUNTER — Telehealth: Payer: Self-pay | Admitting: *Deleted

## 2015-01-04 NOTE — Telephone Encounter (Signed)
-----   Message from Su Hoff, MD sent at 01/04/2015  1:08 PM EDT ----- Ok no problem. We will just have him go on the 19th. Thank you!  ----- Message -----    From: Hassan Buckler, RN    Sent: 01/03/2015   2:07 PM      To: Su Hoff, MD  Nurse stated Dr Lowell Guitar is not in the office this week nor next week and first day back is on the 19th which is pt's appt. And since he saw the pt in the hospital, prefer pt sees him. But if you want him to see someone else, you can call and speak to one of the other doctors. [phone] Thanks ----- Message -----    From: Su Hoff, MD    Sent: 01/03/2015   1:07 PM      To: Hassan Buckler, RN  Hi Ms. Rivka Barbara,  Did [facility] ever call you back? We should try to get Mr. Jacob in earlier for an appointment if we can.  Thanks, [employer]

## 2015-01-04 NOTE — Telephone Encounter (Signed)
Thank you :)

## 2015-01-10 NOTE — Progress Notes (Signed)
Internal Medicine Clinic Attending  Case discussed with Dr. Rivet at the time of the visit.  We reviewed the resident's history and exam and pertinent patient test results.  I agree with the assessment, diagnosis, and plan of care documented in the resident's note.  

## 2015-04-04 ENCOUNTER — Encounter: Payer: Self-pay | Admitting: Internal Medicine

## 2015-04-04 ENCOUNTER — Ambulatory Visit (INDEPENDENT_AMBULATORY_CARE_PROVIDER_SITE_OTHER): Payer: BLUE CROSS/BLUE SHIELD | Admitting: Internal Medicine

## 2015-04-04 VITALS — BP 116/71 | HR 100 | Temp 98.2°F | Ht 69.0 in | Wt 150.7 lb

## 2015-04-04 DIAGNOSIS — N028 Recurrent and persistent hematuria with other morphologic changes: Secondary | ICD-10-CM | POA: Diagnosis not present

## 2015-04-04 MED ORDER — PREDNISONE 20 MG PO TABS: 40.0000 mg | ORAL_TABLET | Freq: Every day | ORAL | Status: DC

## 2015-04-04 NOTE — Patient Instructions (Signed)
Thank you for coming in today  -I am refilling your Prednisone prescription today. I will get in touch with Dr.Powell and let him know. I will call you if there are any changes.  -Follow up with Dr. Lowell GuitarPowell in January -Come back to see us in clinic in 6 months to a year or as needed

## 2015-04-04 NOTE — Progress Notes (Signed)
Patient ID: Johnny Conner, male   DOB: 1996/03/10, [age] y.o.   MRN: [id number]   Subjective:   Patient ID: Johnny HurterJoshua W Conner male   DOB: 1996/03/10 19 y.o.   MRN: [id number]  HPI: Johnny Conner is a 19 y.o. male with a history of recently diagnosed IgA nephopathy presents today for follow up.   He reports he is has been doing well since his last visit. No longer having any dark 'tea-colored' urine and denies any urinary symptoms. No dysuria, hematuria, urgency or frequency. He does report continued occasional lower back pain bilaterally that is worst at night when lying down. He describes it as a dull ache. No fevers or chills. No abdominal pain. He reports being transitioned to Prednisone 40 mg daily at his September 19th visit with Dr. Pia Conner though his clinic notes say continue Prednisone 60 mg daily. He does note that he has been having difficulty sleeping over the past week. He has difficult initiating sleep and reports only sleeping 3-4 hours a night. 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 He also reports taking in Prednisone when he gets home from work before bed. Reports he has quit smoking since his hospitalization.   Past Medical History  Diagnosis Date  . Pyelonephritis 2012    treated with 3 days of unspecified abx   Current Outpatient Prescriptions  Medication Sig Dispense Refill  . predniSONE (DELTASONE) 20 MG tablet Take 2 tablets (40 mg total) by mouth daily with breakfast. 60 tablet 3   No current facility-administered medications for this visit.   No family history on file. Social History   Social History  . Marital Status: Single    Spouse Name: N/A  . Number of Children: N/A  . Years of Education: N/A   Social History Main Topics  . Smoking status: Former Smoker -- 0.10 packs/day for 1 years    Types: Cigarettes  . Smokeless tobacco: Never Used     Comment: Quit x 1 month.   . Alcohol Use: No  . Drug Use: No  . Sexual Activity: Not Asked     Comment: Last sexual experience 7-8 m ago, patient had a negative STI test since then   Other Topics Concern  . None   Social History Narrative   Review of Systems: Review of Systems  Constitutional: Negative for fever and chills.  Gastrointestinal: Negative for nausea, vomiting and abdominal pain.  Genitourinary: Negative for dysuria, urgency, frequency, hematuria and flank pain.  Musculoskeletal: Positive for back pain. Negative for myalgias and joint pain.  Skin: Negative for rash.  Neurological: Negative for dizziness and headaches.  Psychiatric/Behavioral: The patient has insomnia.     Objective:  Physical Exam: Filed Vitals:   04/04/15 1525  BP: 116/71  Pulse: 100  Temp: 98.2 F (36.8 C)  TempSrc: Oral  Height: 5\' 9"  (1.753 m)  Weight: 150 lb 11.2 oz (68.357 kg)  SpO2: 98%   General: alert, sitting up, pleasant, NAD HEENT: [place]/AT, EOMI, sclera anicteric, mucus membranes moist CV: RRR, no m/g/r Pulm: CTA bilaterally, breaths non-labored Abd: BS+, soft, non-tender, non-distended Back: No tenderness to palpation Ext: warm, no peripheral edema Neuro: alert and oriented x 3  Assessment & Plan:   Case discussed with Dr. Sabino NiemannVincet. Please refer to Problem based charting for further details of today's visit.

## 2015-04-05 LAB — BMP8+ANION GAP
ANION GAP: 16 mmol/L (ref 10.0–18.0)
BUN/Creatinine Ratio: 16 (ref 8–19)
BUN: 17 mg/dL (ref 6–20)
CALCIUM: 9.3 mg/dL (ref 8.7–10.2)
CO2: 23 mmol/L (ref 18–29)
CREATININE: 1.08 mg/dL (ref 0.76–1.27)
Chloride: 103 mmol/L (ref 97–106)
GFR calc Af Amer: 114 mL/min/{1.73_m2} (ref >59)
GFR, EST NON AFRICAN AMERICAN: 98 mL/min/{1.73_m2} (ref >59)
Glucose: 81 mg/dL (ref 65–99)
POTASSIUM: 3.6 mmol/L (ref 3.5–5.2)
Sodium: 142 mmol/L (ref 136–144)

## 2015-04-08 NOTE — Assessment & Plan Note (Signed)
He reports he is has been doing well since his last visit. No longer having any dark 'tea-colored' urine and denies any urinary symptoms. No dysuria, hematuria, urgency or frequency. He does report continued occasional lower back pain bilaterally that is worst at night when lying down. He describes it as a dull ache. No fevers or chills. No abdominal pain. He reports being transitioned to Prednisone 40 mg daily at his September 19th visit with Dr. Pia MauBowles though his clinic notes say continue Prednisone 60 mg daily. Marland Kitchen. He does note that he has been having difficulty sleeping over the past week. He has difficult initiating sleep and reports only sleeping 3-4 hours a night. 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 He also reports taking in Prednisone when he gets home from work before bed. Reports he has quit smoking since his hospitalization.   Plan -BMP done today shows Cr improved from 1.69 to 1.08 with a GFR of 98. -Will continue Prednisone 40 mg daily. Discussed taking dose in the morning instead of before bed. Will attempt to contact Dr. Pia MauBowles to confirm dosing but would not increase dose back to 60 given kidney function improvement. Has follow up with nephrology in January -Reports 24 hour urine Cr/Protein done. Results unavailable to us. Will send message to Dr. Pia MauBowles about the results and potentially starting an ACE-i.

## 2015-04-09 NOTE — Progress Notes (Signed)
Internal Medicine Clinic Attending  I saw and evaluated the patient.  I personally confirmed the key portions of the history and exam documented by Dr. Karma GreaserBoswell and I reviewed pertinent patient test results.  The assessment, diagnosis, and plan were formulated together and I agree with the documentation in the resident's note.  Johnny Conner is doing great, renal function returning to normal on Prednisone 40mg  daily. 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 No symptoms of systemic vasculitis today. Return as needed, follow up with nephrology as scheduled.

## 2015-10-24 ENCOUNTER — Encounter: Payer: BLUE CROSS/BLUE SHIELD | Admitting: Internal Medicine

## 2015-10-24 ENCOUNTER — Encounter: Payer: Self-pay | Admitting: Internal Medicine

## 2017-02-03 ENCOUNTER — Emergency Department (INDEPENDENT_AMBULATORY_CARE_PROVIDER_SITE_OTHER)
Admission: EM | Admit: 2017-02-03 | Discharge: 2017-02-03 | Disposition: A | Discharge status: Home / Self Care | Payer: BLUE CROSS/BLUE SHIELD | Source: Home / Self Care | Attending: Family Medicine | Admitting: Family Medicine

## 2017-02-03 DIAGNOSIS — R112 Nausea with vomiting, unspecified: Secondary | ICD-10-CM

## 2017-02-03 DIAGNOSIS — R52 Pain, unspecified: Secondary | ICD-10-CM

## 2017-02-03 DIAGNOSIS — R0981 Nasal congestion: Secondary | ICD-10-CM

## 2017-02-03 LAB — POCT CBC W AUTO DIFF (K'VILLE URGENT CARE)

## 2017-02-03 LAB — POCT URINALYSIS DIP (MANUAL ENTRY)
BILIRUBIN UA: NEGATIVE mg/dL
Bilirubin, UA: NEGATIVE
Glucose, UA: NEGATIVE mg/dL
LEUKOCYTES UA: NEGATIVE
Nitrite, UA: NEGATIVE
PH UA: 7 (ref 5.0–8.0)
Spec Grav, UA: 1.02 (ref 1.010–1.025)
UROBILINOGEN UA: 1 U/dL

## 2017-02-03 NOTE — ED Provider Notes (Signed)
[facility]    CSN: [id number] Arrival date & time: 02/03/17  1713     History   Chief Complaint Chief Complaint  Patient presents with  . Back Pain  . Nausea  . Emesis    HPI Johnny Conner is a 21 y.o. male.   HPI Johnny Conner is a 21 y.o. male presenting to UC with c/o generalized back pain, stuffiness, congestion, generalized headache, nausea, and vomiting x1 Sunday night.  He was seen by his PCP yesterday and prescribed Zofran but states he tried eating after he woke around 3PM today (pt work's second shift, "That is my breakfast), and vomited once.  He has not tried to eat or drink anything since then.  Pt does have hx of acute kidney failure in the past as well as IgA nephropathy determined by renal biopsy.  He has not f/u with is renal specialist in about 2 years but he schedule a f/u within the next several weeks.  He does not have a PCP.  Denies sick contacts or recent travel.    Past Medical History:  Diagnosis Date  . Pyelonephritis 2012   treated with 3 days of unspecified abx    Patient Active Problem List   Diagnosis Date Noted  . IgA nephropathy determined by renal biopsy 12/25/2014    History reviewed. No pertinent surgical history.     Home Medications    Prior to Admission medications   Medication Sig Start Date End Date Taking? Authorizing Provider  predniSONE (DELTASONE) 20 MG tablet Take 2 tablets (40 mg total) by mouth daily with breakfast. 04/04/15   Valentino Nose, MD    Family History History reviewed. No pertinent family history.  Social History Social History  Substance Use Topics  . Smoking status: Former Smoker    Packs/day: 0.10    Years: 1.00    Types: Cigarettes  . Smokeless tobacco: Never Used     Comment: Quit x 1 month.  . Alcohol use No     Allergies   Patient has no known allergies.   Review of Systems Review of Systems  Constitutional: Negative for chills and fever.  HENT: Positive for  congestion and postnasal drip. Negative for ear pain, sore throat, trouble swallowing and voice change.   Respiratory: Positive for cough. Negative for shortness of breath. Choking:  minimal.   Cardiovascular: Negative for chest pain and palpitations.  Gastrointestinal: Positive for nausea and vomiting. Negative for abdominal pain and diarrhea.  Genitourinary: Negative for dysuria, frequency, hematuria and penile pain.  Musculoskeletal: Positive for back pain. Negative for arthralgias and myalgias.  Skin: Negative for rash.     Physical Exam Triage Vital Signs ED Triage Vitals  Enc Vitals Group     BP 02/03/17 1738 108/78     Pulse Rate 02/03/17 1738 84     Resp --      Temp 02/03/17 1738 98.7 F (37.1 C)     Temp Source 02/03/17 1738 Oral     SpO2 02/03/17 1738 99 %     Weight 02/03/17 1739 155 lb (70.3 kg)     Height 02/03/17 1739  (1.753 m)     Head Circumference --      Peak Flow --      Pain Score 02/03/17 1739 3     Pain Loc --      Pain Edu? --      Excl. in GC? --    No data found.  Updated Vital Signs BP 108/78 (BP Location: Left Arm)   Pulse 84   Temp 98.7 F (37.1 C) (Oral)   Ht  (1.753 m)   Wt 155 lb (70.3 kg)   SpO2 99%   BMI 22.89 kg/m   Visual Acuity Right Eye Distance:   Left Eye Distance:   Bilateral Distance:    Right Eye Near:   Left Eye Near:    Bilateral Near:     Physical Exam  Constitutional: He is oriented to person, place, and time. He appears well-developed and well-nourished. No distress.  HENT:  Head: Normocephalic and atraumatic.  Right Ear: Tympanic membrane normal.  Left Ear: Tympanic membrane normal.  Nose: Nose normal.  Mouth/Throat: Uvula is midline, oropharynx is clear and moist and mucous membranes are normal.  Eyes: EOM are normal.  Neck: Normal range of motion. Neck supple.  Cardiovascular: Normal rate and regular rhythm.   Pulmonary/Chest: Effort normal and breath sounds normal. No stridor. No  respiratory distress. He has no wheezes. He has no rales.  Abdominal: Soft. He exhibits no distension. There is no tenderness. There is no CVA tenderness.  Musculoskeletal: Normal range of motion.  Lymphadenopathy:    He has no cervical adenopathy.  Neurological: He is alert and oriented to person, place, and time.  Skin: Skin is warm and dry. He is not diaphoretic.  Psychiatric: He has a normal mood and affect. His behavior is normal.  Nursing note and vitals reviewed.    UC Treatments / Results  Labs (all labs ordered are listed, but only abnormal results are displayed) Labs Reviewed  POCT URINALYSIS DIP (MANUAL ENTRY) - Abnormal; Notable for the following:       Result Value   Blood, UA large (*)    Protein Ur, POC =30 (*)    All other components within normal limits  COMPLETE METABOLIC PANEL WITH GFR  POCT CBC W AUTO DIFF ([facility])    EKG  EKG Interpretation None       Radiology No results found.  Procedures Procedures (including critical care time)  Medications Ordered in UC Medications - No data to display   Initial Impression / Assessment and Plan / UC Course  I have reviewed the triage vital signs and the nursing notes.  Pertinent labs & imaging results that were available during my care of the patient were reviewed by me and considered in my medical decision making (see chart for details).     Pt with known kidney disease c/o nausea, vomiting and back pain.  He does have blood in his urine but denies pain or difficulty urinating.  He has not tried to drink any fluids today after vomiting after eating and drinking around 3PM.   Strongly encouraged to stay well hydrated. Encouraged him to take small sips of clear liquids. My try ice chips, popsicles, broth, jell-o, etc.   Encouraged to f/u with his renal specialist as scheduled, however, also encouraged to establish care with a PCP.   Final Clinical Impressions(s) / UC Diagnoses   Final  diagnoses:  Nausea and vomiting in adult patient  Nasal congestion  Body aches    New Prescriptions Discharge Medication List as of 02/03/2017  6:20 PM       Controlled Substance Prescriptions [place] Controlled Substance Registry consulted? Not Applicable   Rolla Plate 02/04/17 0981

## 2017-02-03 NOTE — Discharge Instructions (Signed)
°  You may take  acetaminophen every 4-6 hours or in combination with ibuprofen 400-600mg  every 6-8 hours as needed for pain, inflammation, and fever.  Be sure to drink at least eight 8oz glasses of water to stay well hydrated and get at least 8 hours of sleep at night, preferably more while sick.  If you are feeling nauseated you should take very small sips of clear fluids, jell-o, broths, ice chips, and popsicles.

## 2017-02-03 NOTE — ED Triage Notes (Signed)
Started Friday night with back pain.  Over the weekend, stuffiness,headache.  Sunday night nausea and vomiting, and unable to keep anything down.  Was prescribed Zofran by MD yesterday, not helping, still vomiting about 1 hour after eating.

## 2017-02-04 ENCOUNTER — Emergency Department (HOSPITAL_COMMUNITY): Payer: BLUE CROSS/BLUE SHIELD

## 2017-02-04 ENCOUNTER — Telehealth: Payer: Self-pay | Admitting: Emergency Medicine

## 2017-02-04 ENCOUNTER — Observation Stay (HOSPITAL_COMMUNITY)
Admission: EM | Admit: 2017-02-04 | Discharge: 2017-02-05 | Disposition: A | Discharge status: Home / Self Care | Payer: BLUE CROSS/BLUE SHIELD | Attending: Internal Medicine | Admitting: Internal Medicine

## 2017-02-04 ENCOUNTER — Encounter (HOSPITAL_COMMUNITY): Payer: Self-pay

## 2017-02-04 DIAGNOSIS — Z87891 Personal history of nicotine dependence: Secondary | ICD-10-CM | POA: Diagnosis not present

## 2017-02-04 DIAGNOSIS — N028 Recurrent and persistent hematuria with other morphologic changes: Secondary | ICD-10-CM | POA: Insufficient documentation

## 2017-02-04 DIAGNOSIS — N179 Acute kidney failure, unspecified: Secondary | ICD-10-CM

## 2017-02-04 DIAGNOSIS — E860 Dehydration: Secondary | ICD-10-CM

## 2017-02-04 DIAGNOSIS — R319 Hematuria, unspecified: Secondary | ICD-10-CM

## 2017-02-04 DIAGNOSIS — R109 Unspecified abdominal pain: Secondary | ICD-10-CM | POA: Diagnosis not present

## 2017-02-04 DIAGNOSIS — N281 Cyst of kidney, acquired: Secondary | ICD-10-CM | POA: Diagnosis not present

## 2017-02-04 DIAGNOSIS — N390 Urinary tract infection, site not specified: Secondary | ICD-10-CM | POA: Diagnosis not present

## 2017-02-04 DIAGNOSIS — R112 Nausea with vomiting, unspecified: Secondary | ICD-10-CM | POA: Diagnosis not present

## 2017-02-04 DIAGNOSIS — R1030 Lower abdominal pain, unspecified: Secondary | ICD-10-CM | POA: Diagnosis not present

## 2017-02-04 LAB — COMPLETE METABOLIC PANEL WITH GFR
AG Ratio: 1.8 (calc) (ref 1.0–2.5)
ALT: 14 U/L (ref 9–46)
AST: 18 U/L (ref 10–40)
Albumin: 4.4 g/dL (ref 3.6–5.1)
Alkaline phosphatase (APISO): 71 U/L (ref 40–115)
BUN: 14 mg/dL (ref 7–25)
CO2: 24 mmol/L (ref 20–32)
Calcium: 9.1 mg/dL (ref 8.6–10.3)
Chloride: 100 mmol/L (ref 98–110)
Creat: 1.22 mg/dL (ref 0.60–1.35)
GFR, Est African American: 97 mL/min/{1.73_m2} (ref >=60)
GFR, Est Non African American: 84 mL/min/{1.73_m2} (ref >=60)
Globulin: 2.4 g/dL (calc) (ref 1.9–3.7)
Glucose, Bld: 84 mg/dL (ref 65–99)
Potassium: 4.2 mmol/L (ref 3.5–5.3)
Sodium: 137 mmol/L (ref 135–146)
Total Bilirubin: 0.8 mg/dL (ref 0.2–1.2)
Total Protein: 6.8 g/dL (ref 6.1–8.1)

## 2017-02-04 LAB — I-STAT CG4 LACTIC ACID, ED
Lactic Acid, Venous: 1.43 mmol/L (ref 0.5–1.9)
Lactic Acid, Venous: 0.8 mmol/L (ref 0.5–1.9)

## 2017-02-04 LAB — URINALYSIS, ROUTINE W REFLEX MICROSCOPIC
BILIRUBIN URINE: NEGATIVE
Glucose, UA: NEGATIVE mg/dL
Ketones, ur: NEGATIVE mg/dL
LEUKOCYTES UA: NEGATIVE
NITRITE: NEGATIVE
PH: 7 (ref 5.0–8.0)
Protein, ur: 100 mg/dL — AB
SPECIFIC GRAVITY, URINE: 1.029 (ref 1.005–1.030)
Squamous Epithelial / LPF: NONE SEEN

## 2017-02-04 LAB — COMPREHENSIVE METABOLIC PANEL
ALBUMIN: 4.1 g/dL (ref 3.5–5.0)
ALK PHOS: 75 U/L (ref 38–126)
ALT: 21 U/L (ref 17–63)
AST: 21 U/L (ref 15–41)
Anion gap: 10 (ref 5–15)
BUN: 13 mg/dL (ref 6–20)
CALCIUM: 9.3 mg/dL (ref 8.9–10.3)
CHLORIDE: 99 mmol/L — AB (ref 101–111)
CO2: 27 mmol/L (ref 22–32)
CREATININE: 1.33 mg/dL — AB (ref 0.61–1.24)
GFR calc Af Amer: >60 mL/min (ref >60)
GFR calc non Af Amer: >60 mL/min (ref >60)
Glucose, Bld: 107 mg/dL — ABNORMAL HIGH (ref 65–99)
Potassium: 4.1 mmol/L (ref 3.5–5.1)
SODIUM: 136 mmol/L (ref 135–145)
Total Bilirubin: 1.4 mg/dL — ABNORMAL HIGH (ref 0.3–1.2)
Total Protein: 7 g/dL (ref 6.5–8.1)

## 2017-02-04 LAB — CBC
HCT: 43.6 % (ref 39.0–52.0)
Hemoglobin: 14.9 g/dL (ref 13.0–17.0)
MCH: 31.2 pg (ref 26.0–34.0)
MCHC: 34.2 g/dL (ref 30.0–36.0)
MCV: 91.2 fL (ref 78.0–100.0)
PLATELETS: 170 10*3/uL (ref 150–400)
RBC: 4.78 MIL/uL (ref 4.22–5.81)
RDW: 11.9 % (ref 11.5–15.5)
WBC: 7.7 10*3/uL (ref 4.0–10.5)

## 2017-02-04 LAB — INFLUENZA PANEL BY PCR (TYPE A & B)
Influenza A By PCR: NEGATIVE
Influenza B By PCR: NEGATIVE

## 2017-02-04 LAB — LIPASE, BLOOD: LIPASE: 22 U/L (ref 11–51)

## 2017-02-04 IMAGING — CT CT RENAL STONE PROTOCOL
2 of 4 series · 16 of 46 positions shown, 18 images · non-contrast
Comparison: [DATE]

CLINICAL DATA: Hematuria with unknown causes. Lower abdominal pain.
History of CHOKSHI nephropathy.

EXAM:
CT ABDOMEN AND PELVIS WITHOUT CONTRAST
TECHNIQUE: Multidetector CT imaging of the abdomen and pelvis was performed
following the standard protocol without IV contrast.

[Series 3: renal stone 5.0 · axial · 0.75mm/px · z∈[-433,-33]mm · 13 of 90 slices shown, 15 images]
[im 5/90  soft-tissue]
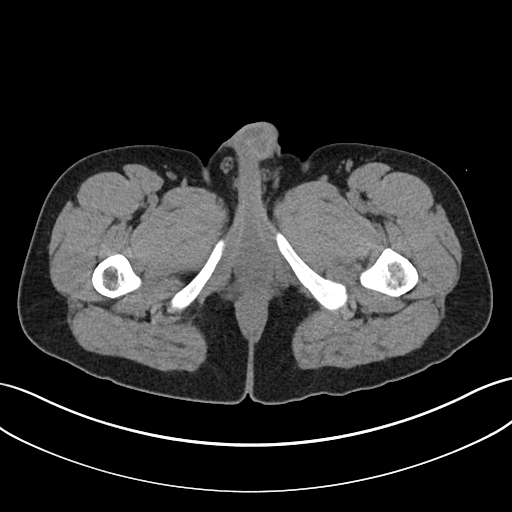
[im 5/90  bone]
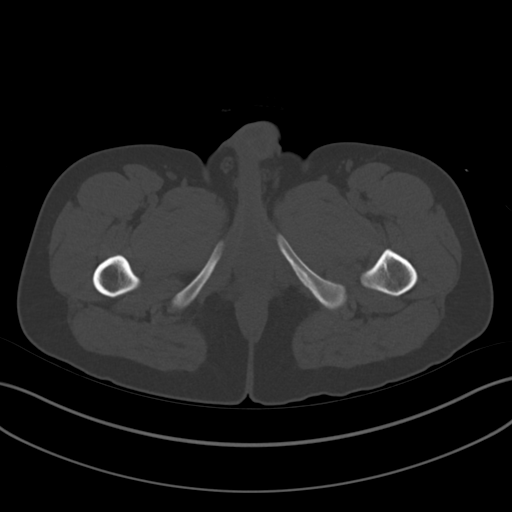
[im 13/90  soft-tissue]
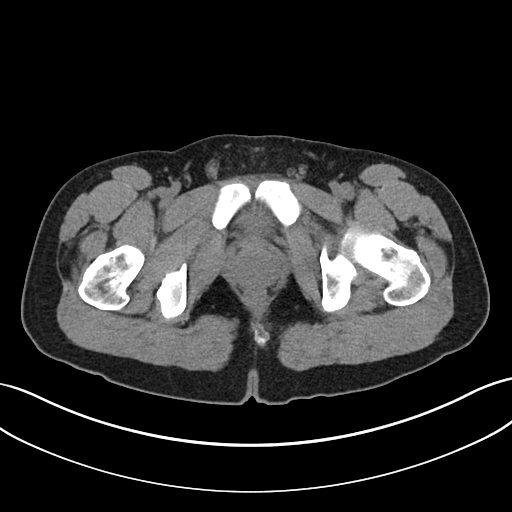
[im 17/90  soft-tissue]
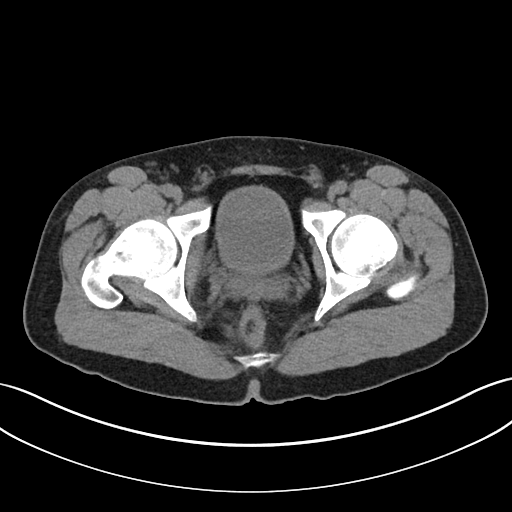
[im 26/90  soft-tissue]
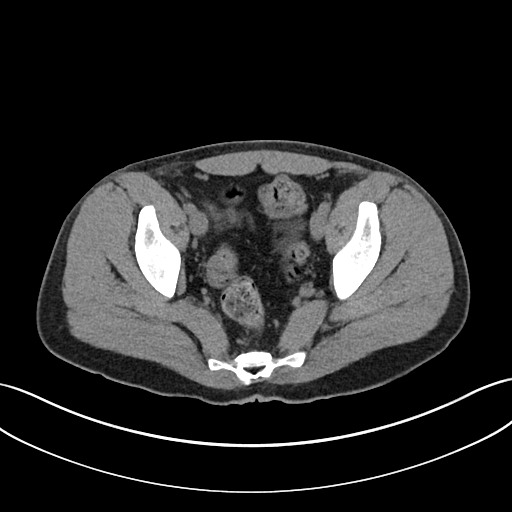
[im 30/90  soft-tissue]
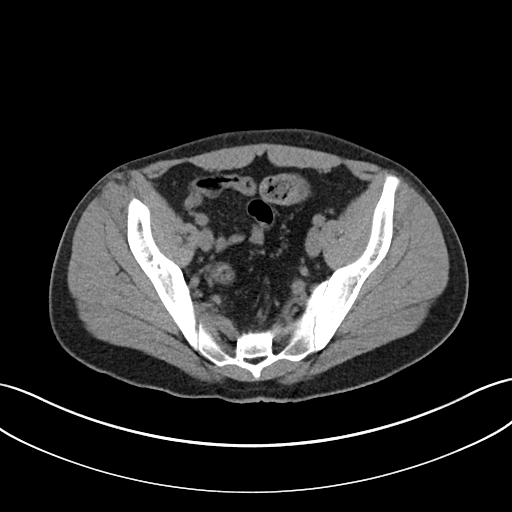
[im 39/90  soft-tissue]
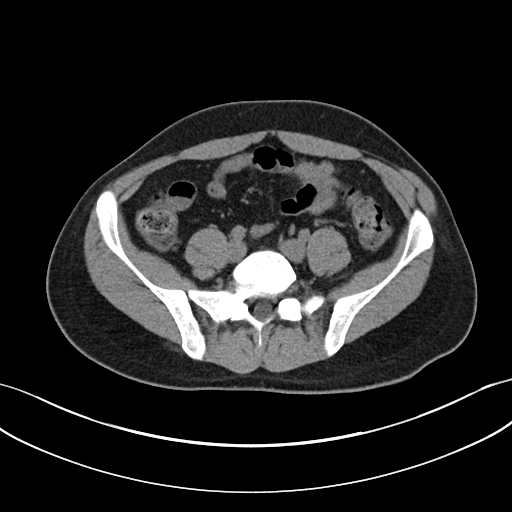
[im 47/90  soft-tissue]
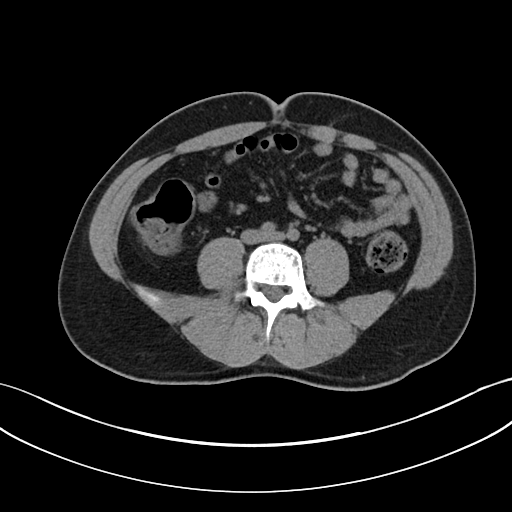
[im 51/90  soft-tissue]
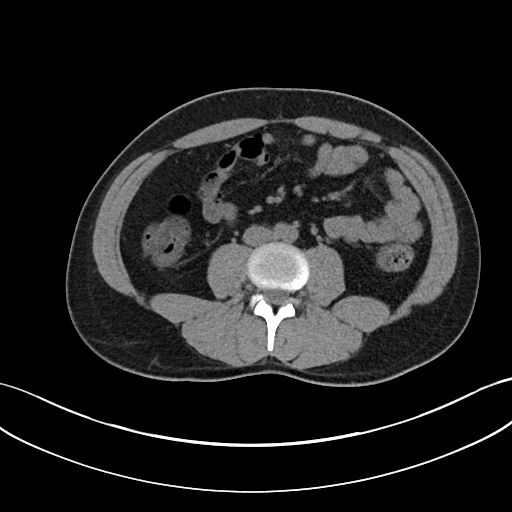
[im 60/90  soft-tissue]
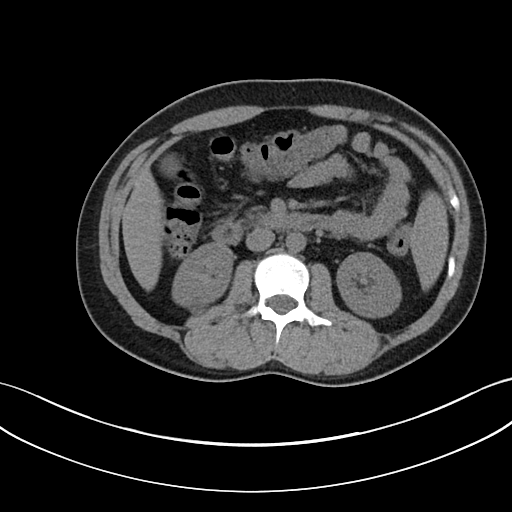
[im 60/90  bone]
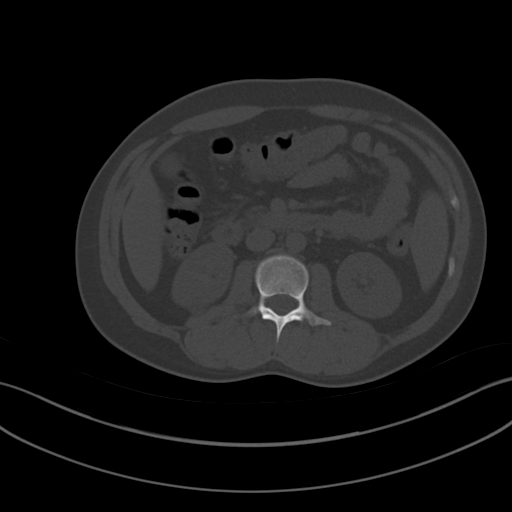
[im 64/90  soft-tissue]
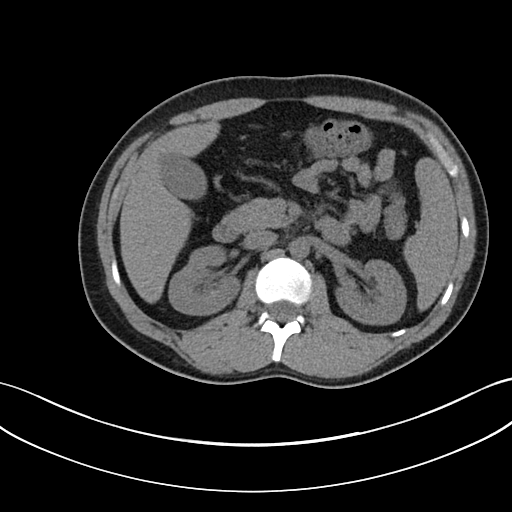
[im 73/90  soft-tissue]
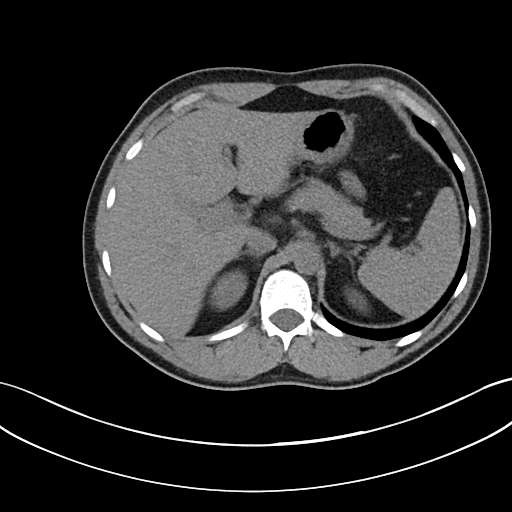
[im 77/90  soft-tissue]
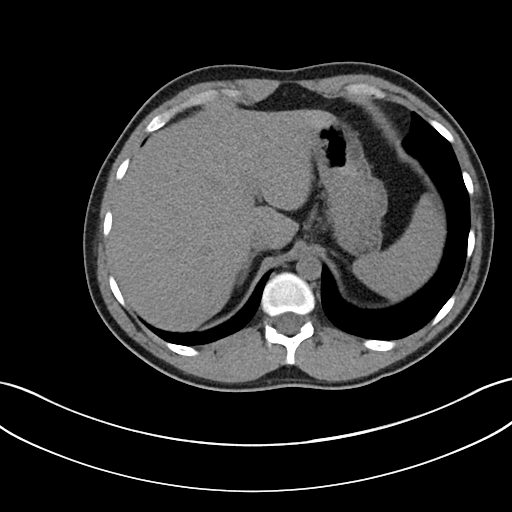
[im 85/90  soft-tissue]
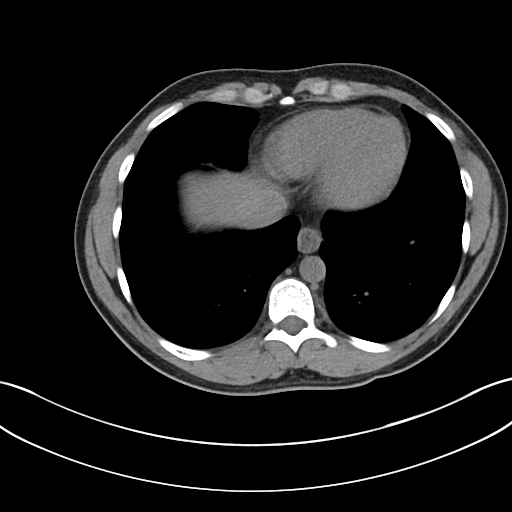

[Series 6: renal stone 3.0 cor · coronal · 0.75mm/px · 3 of 101 slices shown]
[im 34/101  soft-tissue]
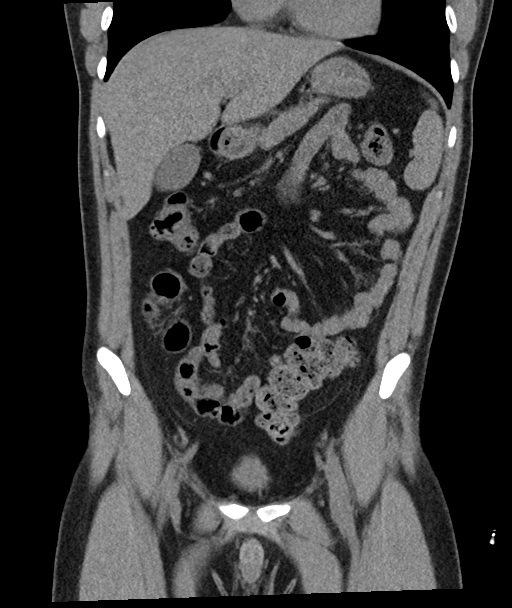
[im 45/101  soft-tissue]
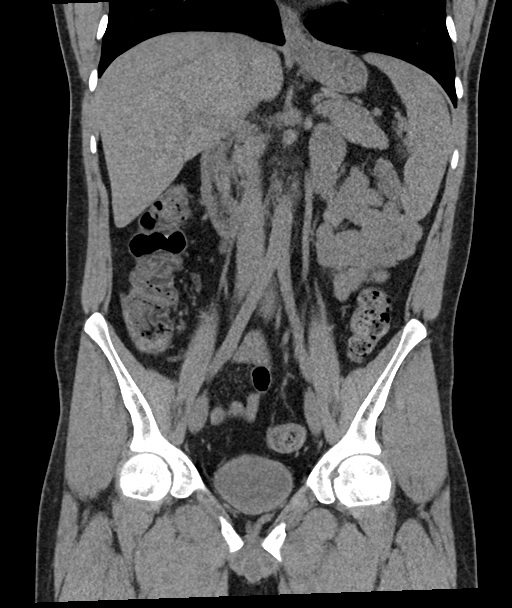
[im 56/101  soft-tissue]
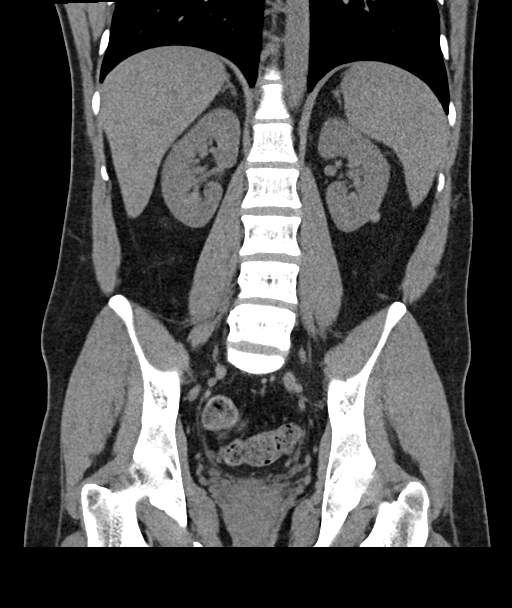

[16 of 46 positions shown; findings below may reference images not displayed]

FINDINGS: Lower chest:  No contributory findings.

Hepatobiliary: No focal liver abnormality.No evidence of biliary
obstruction or stone.

Pancreas: Unremarkable.

Spleen: Unremarkable.

Adrenals/Urinary Tract: Negative adrenals. No hydronephrosis or
stone. There is a scar-like excavation in the right interpolar renal
cortex where there was a previously seen cystic density. Larger
right posterior renal cystic density measuring approximately 1.3 cm.
There is a exophytic high-density nodule in the lower pole left
kidney that is stable at 5 mm, usually hemorrhagic/proteinaceous
cyst. Unremarkable bladder.

Stomach/Bowel:  No obstruction. No appendicitis.

Vascular/Lymphatic: No acute vascular abnormality. No mass or
adenopathy.

Reproductive:No pathologic findings.

Other: No ascites or pneumoperitoneum.

Musculoskeletal: No acute abnormalities.
IMPRESSION: 1. No acute finding.  No hydronephrosis or urolithiasis.
2. Small scar like defect in the right kidney where there was cystic
density seen in [VI].
3. Stable small presumed proteinaceous cyst in the lower pole left
kidney. 13 mm right renal cystic density.

## 2017-02-04 MED ORDER — ONDANSETRON HCL 4 MG PO TABS: 4.0000 mg | ORAL_TABLET | Freq: Four times a day (QID) | ORAL | Status: DC | PRN

## 2017-02-04 MED ORDER — ACETAMINOPHEN 650 MG RE SUPP: 650.0000 mg | Freq: Four times a day (QID) | RECTAL | Status: DC | PRN

## 2017-02-04 MED ORDER — TRAZODONE HCL 50 MG PO TABS: 25.0000 mg | ORAL_TABLET | Freq: Every evening | ORAL | Status: DC | PRN

## 2017-02-04 MED ORDER — ORAL CARE MOUTH RINSE
15.0000 mL | Freq: Two times a day (BID) | OROMUCOSAL | Status: DC
  Administered 2017-02-05: 15 mL via OROMUCOSAL

## 2017-02-04 MED ORDER — SODIUM CHLORIDE 0.9 % IV BOLUS (SEPSIS)
1000.0000 mL | Freq: Once | INTRAVENOUS | Status: AC
  Administered 2017-02-04: 1000 mL via INTRAVENOUS

## 2017-02-04 MED ORDER — ONDANSETRON HCL 4 MG/2ML IJ SOLN: 4.0000 mg | Freq: Four times a day (QID) | INTRAMUSCULAR | Status: DC | PRN

## 2017-02-04 MED ORDER — BISACODYL 10 MG RE SUPP: 10.0000 mg | Freq: Every day | RECTAL | Status: DC | PRN

## 2017-02-04 MED ORDER — ACETAMINOPHEN 325 MG PO TABS: 650.0000 mg | ORAL_TABLET | Freq: Four times a day (QID) | ORAL | Status: DC | PRN

## 2017-02-04 MED ORDER — SENNOSIDES-DOCUSATE SODIUM 8.6-50 MG PO TABS: 1.0000 | ORAL_TABLET | Freq: Every evening | ORAL | Status: DC | PRN

## 2017-02-04 MED ORDER — CHLORHEXIDINE GLUCONATE 0.12 % MT SOLN
15.0000 mL | Freq: Two times a day (BID) | OROMUCOSAL | Status: DC
  Administered 2017-02-05: 15 mL via OROMUCOSAL
  Filled 2017-02-04: qty 15

## 2017-02-04 MED ORDER — SODIUM CHLORIDE 0.9 % IV SOLN
Freq: Once | INTRAVENOUS | Status: AC
  Administered 2017-02-04: 16:00:00 via INTRAVENOUS

## 2017-02-04 MED ORDER — ACETAMINOPHEN 325 MG PO TABS
650.0000 mg | ORAL_TABLET | Freq: Once | ORAL | Status: AC
  Administered 2017-02-04: 650 mg via ORAL
  Filled 2017-02-04: qty 2

## 2017-02-04 NOTE — H&P (Signed)
History and Physical    JAMIR RONE WUJ:[id number] DOB: Nov 24, 1995 DOA: 02/04/2017   PCP: Patient, No Pcp Per   Patient coming from:  Home    Chief Complaint: Nausea, vomiting, abdominal pain   HPI: Johnny Conner is a 21 y.o. male with medical history significant for IgA nephropathy pre renal biopsy diagnosed in 2016, not seen since diagnosis, and not on steroids, presenting with 3-4 day history of generalized fatigue, subjective fevers, nausea and vomiting, with lower abdominal cramping. He denies any diarrhea. The patient denies any sick contacts. Of note, the patient also reported a diffuse myalgias, nasal congestion, since Sunday. The patient had been seen at urgent care yesterday, and this was felt to be a viral illness, supportive care was recommended. However, as the patient could not eat or drink due to the symptoms, and continued to vomit this morning, he presented to the ED for further evaluation. No headaches or confusion. He denies any food poisoning, or sick contacts. He denies any dysuria, but he does have increasingly dark, tea colored urine urine. He denies any flank pain. He denies taking any over-the-counter supplements.   ED Course:  BP 95/70   Pulse 78   Temp 100.2 F (37.9 C) (Oral)   Resp 18   SpO2 98%    Creatinine 1.33 Urine Amber, hazy, large hemoglobin, proteinuria noted Lipase normal  Lactic acid was 1.43, with repeat 0.8 EDP had a phone discussion with Dr. Hyman Hopes, nephrology, who recommended IV fluids, and no need for steroids at this time, suspecting a viral syndrome, in which case hematuria is not uncommon. The patient received 2 L of IV fluids, and anti-emetics, with some improvement of his symptoms. He is a favorable at this time. White count is normal at 7.7, no differential has been drawn.  Review of Systems:  As per HPI otherwise all other systems reviewed and are negative  Past Medical History:  Diagnosis Date  . Pyelonephritis 2012   treated  with 3 days of unspecified abx    History reviewed. No pertinent surgical history.  Social History Social History   Social History  . Marital status: Single    Spouse name: N/A  . Number of children: N/A  . Years of education: N/A   Occupational History  . [employer]    Social History Main Topics  . Smoking status: Former Smoker    Packs/day: 0.10    Years: 1.00    Types: Cigarettes  . Smokeless tobacco: Never Used     Comment: Quit x 1 month.  . Alcohol use No  . Drug use: No  . Sexual activity: Not on file     Comment: Last sexual experience 7-8 m ago, patient had a negative STI test since then   Other Topics Concern  . Not on file   Social History Narrative  . No narrative on file     No Known Allergies  History reviewed. No pertinent family history.    Prior to Admission medications   Not on File    Physical Exam:  Vitals:   02/04/17 1345 02/04/17 1415 02/04/17 1500 02/04/17 1530  BP: 97/66 (!) 81/53 (!) 81/53 95/70  Pulse: 77 64 74 78  Resp:      Temp:      TempSrc:      SpO2: 98% 98% 97% 98%   Constitutional: NAD, calm, comfortable  Eyes: PERRL, lids and conjunctivae normal ENMT: Mucous membranes are moist, without exudate or lesions  Neck:  normal, supple, no masses, no thyromegaly Respiratory: clear to auscultation bilaterally, no wheezing, no crackles. Normal respiratory effort  Cardiovascular: Regular rate and rhythm,  murmur, rubs or gallops. No extremity edema. 2+ pedal pulses. No carotid bruits.  Abdomen: Soft, non tender, No hepatosplenomegaly. Bowel sounds positive. No CVAT Musculoskeletal: no clubbing / cyanosis. Moves all extremities Skin: no jaundice, No lesions. Multiple tattoos Neurologic: Sensation intact  Strength equal in all extremities Psychiatric:   Alert and oriented x 3. Normal mood.     Labs on Admission: I have personally reviewed following labs and imaging studies  CBC:  Recent Labs Lab 02/04/17 0907  WBC 7.7  HGB  14.9  HCT 43.6  MCV 91.2  PLT 170    Basic Metabolic Panel:  Recent Labs Lab 02/03/17 1802 02/04/17 0907  NA 137 136  K 4.2 4.1  CL 100 99*  CO2 24 27  GLUCOSE 84 107*  BUN 14 13  CREATININE 1.22 1.33*  CALCIUM 9.1 9.3    GFR: Estimated Creatinine Clearance: 86.6 mL/min (A) (by C-G formula based on SCr of 1.33 mg/dL (H)).  Liver Function Tests:  Recent Labs Lab 02/03/17 1802 02/04/17 0907  AST 18 21  ALT 14 21  ALKPHOS  --  75  BILITOT 0.8 1.4*  PROT 6.8 7.0  ALBUMIN  --  4.1    Recent Labs Lab 02/04/17 0907  LIPASE 22   No results for input(s): AMMONIA in the last 168 hours.  Coagulation Profile: No results for input(s): INR, PROTIME in the last 168 hours.  Cardiac Enzymes: No results for input(s): CKTOTAL, CKMB, CKMBINDEX, TROPONINI in the last 168 hours.  BNP (last 3 results) No results for input(s): PROBNP in the last 8760 hours.  HbA1C: No results for input(s): HGBA1C in the last 72 hours.  CBG: No results for input(s): GLUCAP in the last 168 hours.  Lipid Profile: No results for input(s): CHOL, HDL, LDLCALC, TRIG, CHOLHDL, LDLDIRECT in the last 72 hours.  Thyroid Function Tests: No results for input(s): TSH, T4TOTAL, FREET4, T3FREE, THYROIDAB in the last 72 hours.  Anemia Panel: No results for input(s): VITAMINB12, FOLATE, FERRITIN, TIBC, IRON, RETICCTPCT in the last 72 hours.  Urine analysis:    Component Value Date/Time   COLORURINE AMBER (A) 02/04/2017 0917   APPEARANCEUR HAZY (A) 02/04/2017 0917   APPEARANCEUR Cloudy (A) 01/02/2015 1609   LABSPEC 1.029 02/04/2017 0917   PHURINE 7.0 02/04/2017 0917   GLUCOSEU NEGATIVE 02/04/2017 0917   HGBUR LARGE (A) 02/04/2017 0917   BILIRUBINUR NEGATIVE 02/04/2017 0917   BILIRUBINUR negative 02/03/2017 1742   BILIRUBINUR Negative 01/02/2015 1609   KETONESUR NEGATIVE 02/04/2017 0917   PROTEINUR 100 (A) 02/04/2017 0917   UROBILINOGEN 1.0 02/03/2017 1742   UROBILINOGEN 0.2 12/14/2014 0844     NITRITE NEGATIVE 02/04/2017 0917   LEUKOCYTESUR NEGATIVE 02/04/2017 0917   LEUKOCYTESUR 1+ (A) 01/02/2015 1609    Sepsis Labs: (procalcitonin:4,lacticidven:4) )No results found for this or any previous visit (from the past 240 hour(s)).   Radiological Exams on Admission: Ct Renal Stone Study  Result Date: 02/04/2017 CLINICAL DATA:  Hematuria with unknown causes. Lower abdominal pain. History of IgA nephropathy. EXAM: CT ABDOMEN AND PELVIS WITHOUT CONTRAST TECHNIQUE: Multidetector CT imaging of the abdomen and pelvis was performed following the standard protocol without IV contrast. COMPARISON:  12/12/2014 FINDINGS: Lower chest:  No contributory findings. Hepatobiliary: No focal liver abnormality.No evidence of biliary obstruction or stone. Pancreas: Unremarkable. Spleen: Unremarkable. Adrenals/Urinary Tract: Negative adrenals. No hydronephrosis or  stone. There is a scar-like excavation in the right interpolar renal cortex where there was a previously seen cystic density. Larger right posterior renal cystic density measuring approximately 1.3 cm. There is a exophytic high-density nodule in the lower pole left kidney that is stable at 5 mm, usually hemorrhagic/proteinaceous cyst. Unremarkable bladder. Stomach/Bowel:  No obstruction. No appendicitis. Vascular/Lymphatic: No acute vascular abnormality. No mass or adenopathy. Reproductive:No pathologic findings. Other: No ascites or pneumoperitoneum. Musculoskeletal: No acute abnormalities. IMPRESSION: 1. No acute finding.  No hydronephrosis or urolithiasis. 2. Small scar like defect in the right kidney where there was cystic density seen in 2016. 3. Stable small presumed proteinaceous cyst in the lower pole left kidney. 13 mm right renal cystic density. Electronically Signed   By: Marnee Spring M.D.   On: 02/04/2017 12:49    EKG: Independently reviewed.  Assessment/Plan Active Problems:   Nausea and vomiting   Abdominal pain   IgA  nephropathy determined by renal biopsy  Intractable Nausea and vomiting. Likely viral syndrome. Afebrile, nontoxic appearing. Exam benign. . No abdominal pain. Lactic acid 0.8 White count is normal at 7.7, no differential has been drawn.  LIpase normal .Cr 1.33 around baseline CT renal stone on 10/10 without acute finding. Received IVF and antiemetics with some relief.  Admit to medsurg  Clear liquids as last emesis at 9 am  IV Zofran  I V fluids FLu panel by PCR  Pain control   Hematuria in the setting of viral illness in a patient with IgA nephropathy diagnosed per biopsy in 2016 and treated with steroids.  UA with proteinuria and hemoglobinuria,  Neg for nitrites and WBC EDP had a phone discussion with Dr. Hyman Hopes, nephrology, who recommended IV fluids, and no need for steroids at this time, suspecting a viral syndrome, in which case hematuria is not uncommon. The patient received 2 L of IV fluids, and anti-emetics, with some improvement of his symptoms.  Follow up after discharge to document resolution of hemoglobinuria and proteinuria  Nephrology to see in am if symptoms worsen    DVT prophylaxis:  SCD  Code Status:    Full  Family Communication:  Discussed with patient Disposition Plan: Expect patient to be discharged to home after condition improves Consults called:    Nephrology per phone  Admission status: Obs Medsurg    Marcos Eke, PA-C [facility]   02/04/2017, 4:18 PM

## 2017-02-04 NOTE — Progress Notes (Signed)
Pt admitted from ED to 5w02. Pt is A&Ox4. Pt lives at home alone. Pt oriented to room. Pt's skin warm, dry and intact. Will continue to monitor pt. Nelda Marseille, RN

## 2017-02-04 NOTE — ED Notes (Signed)
Transported to CT 

## 2017-02-04 NOTE — Telephone Encounter (Signed)
Patient is at [facility] ED

## 2017-02-04 NOTE — Progress Notes (Signed)
Received report on pt from ED. Nelda Marseille, RN

## 2017-02-04 NOTE — ED Triage Notes (Signed)
Patient complains of abdominal pressure with vomiting since Monday. Seen at [facility] UC yesterday and not feeling any better. No diarrhea. Has hx of renal failure

## 2017-02-04 NOTE — ED Notes (Signed)
Asking for food   No orders just yet

## 2017-02-04 NOTE — ED Provider Notes (Signed)
[facility]-EMERGENCY DEPT Provider Note   CSN: [id number] Arrival date & time: 02/04/17  4098     History   Chief Complaint Chief Complaint  Patient presents with  . Abdominal Pain  . Emesis    HPI Johnny Conner is a 21 y.o. male.  HPI   21 yo M with PMhxa s below here with PMHx IgA nephropathy here with general fatigue, fevers, lower abd pain x 3-4 days. Pt endorses gradual onset of diffuse myalgias, primarily in LE, nasal congestion, headache, nausea, and vomiting starting Sunday. Since then, he has had persistent vomiting that has not improved with zofran prescribed by his PCP. He went to UC yesterday and had hematuria, was sent home with continued supportive care. Since then, he has been unable to eat/drink. He continuously vomits with any attempt at PO. He has had subjective chills, fevers as well. He has also noticed his urine is increasingly dark and tea-colored. No h/o kidney stones. No overt dysuria. Denies flank pain. He does endorse mild, aching, throbbing lower back pain. No LE weakness or numbness.  Past Medical History:  Diagnosis Date  . Pyelonephritis 2012   treated with 3 days of unspecified abx    Patient Active Problem List   Diagnosis Date Noted  . Abdominal pain 02/04/2017  . 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 Nausea and vomiting 12/14/2014    History reviewed. No pertinent surgical history.     Home Medications    Prior to Admission medications   Not on File    Family History History reviewed. No pertinent family history.  Social History Social History  Substance Use Topics  . Smoking status: Former Smoker    Packs/day: 0.10    Years: 1.00    Types: Cigarettes  . Smokeless tobacco: Never Used     Comment: Quit x 1 month.  . Alcohol use No     Allergies   Patient has no known allergies.   Review of Systems Review of Systems  Constitutional: Positive for chills, fatigue and fever.  Gastrointestinal: Positive  for nausea and vomiting.  Genitourinary: Positive for flank pain and hematuria.  Neurological: Positive for weakness.  All other systems reviewed and are negative.    Physical Exam Updated Vital Signs BP 95/70   Pulse 78   Temp 100.2 F (37.9 C) (Oral)   Resp 18   SpO2 98%   Physical Exam  Constitutional: He is oriented to person, place, and time. He appears well-developed and well-nourished. No distress.  HENT:  Head: Normocephalic and atraumatic.  Mildly dry MM  Eyes: Conjunctivae are normal.  Neck: Neck supple.  Cardiovascular: Normal rate, regular rhythm and normal heart sounds.  Exam reveals no friction rub.   No murmur heard. Pulmonary/Chest: Effort normal and breath sounds normal. No respiratory distress. He has no wheezes. He has no rales.  Abdominal: Soft. Bowel sounds are normal. He exhibits no distension. There is no tenderness. There is no guarding.  No CVAT bilaterally  Musculoskeletal: He exhibits no edema or tenderness.  Neurological: He is alert and oriented to person, place, and time. He exhibits normal muscle tone.  Skin: Skin is warm. Capillary refill takes less than 2 seconds.  Psychiatric: He has a normal mood and affect.  Nursing note and vitals reviewed.    ED Treatments / Results  Labs (all labs ordered are listed, but only abnormal results are displayed) Labs Reviewed  COMPREHENSIVE METABOLIC PANEL - Abnormal; Notable for the following:  Result Value   Chloride 99 (*)    Glucose, Bld 107 (*)    Creatinine, Ser 1.33 (*)    Total Bilirubin 1.4 (*)    All other components within normal limits  URINALYSIS, ROUTINE W REFLEX MICROSCOPIC - Abnormal; Notable for the following:    Color, Urine AMBER (*)    APPearance HAZY (*)    Hgb urine dipstick LARGE (*)    Protein, ur 100 (*)    Bacteria, UA RARE (*)    All other components within normal limits  LIPASE, BLOOD  CBC  HIV ANTIBODY (ROUTINE TESTING)  INFLUENZA PANEL BY PCR (TYPE A & B)    COMPREHENSIVE METABOLIC PANEL  CBC  PROTIME-INR  URINALYSIS, ROUTINE W REFLEX MICROSCOPIC  I-STAT CG4 LACTIC ACID, ED  I-STAT CG4 LACTIC ACID, ED    EKG  EKG Interpretation None       Radiology Ct Renal Stone Study  Result Date: 02/04/2017 CLINICAL DATA:  Hematuria with unknown causes. Lower abdominal pain. History of IgA nephropathy. EXAM: CT ABDOMEN AND PELVIS WITHOUT CONTRAST TECHNIQUE: Multidetector CT imaging of the abdomen and pelvis was performed following the standard protocol without IV contrast. COMPARISON:  12/12/2014 FINDINGS: Lower chest:  No contributory findings. Hepatobiliary: No focal liver abnormality.No evidence of biliary obstruction or stone. Pancreas: Unremarkable. Spleen: Unremarkable. Adrenals/Urinary Tract: Negative adrenals. No hydronephrosis or stone. There is a scar-like excavation in the right interpolar renal cortex where there was a previously seen cystic density. Larger right posterior renal cystic density measuring approximately 1.3 cm. There is a exophytic high-density nodule in the lower pole left kidney that is stable at 5 mm, usually hemorrhagic/proteinaceous cyst. Unremarkable bladder. Stomach/Bowel:  No obstruction. No appendicitis. Vascular/Lymphatic: No acute vascular abnormality. No mass or adenopathy. Reproductive:No pathologic findings. Other: No ascites or pneumoperitoneum. Musculoskeletal: No acute abnormalities. IMPRESSION: 1. No acute finding.  No hydronephrosis or urolithiasis. 2. Small scar like defect in the right kidney where there was cystic density seen in 2016. 3. Stable small presumed proteinaceous cyst in the lower pole left kidney. 13 mm right renal cystic density. Electronically Signed   By: Marnee Spring M.D.   On: 02/04/2017 12:49    Procedures Procedures (including critical care time)  Medications Ordered in ED Medications  acetaminophen (TYLENOL) tablet 650 mg (not administered)    Or  acetaminophen (TYLENOL) suppository  650 mg (not administered)  traZODone (DESYREL) tablet 25 mg (not administered)  senna-docusate (Senokot-S) tablet 1 tablet (not administered)  bisacodyl (DULCOLAX) suppository 10 mg (not administered)  ondansetron (ZOFRAN) tablet 4 mg (not administered)    Or  ondansetron (ZOFRAN) injection 4 mg (not administered)  sodium chloride 0.9 % bolus 1,000 mL (0 mLs Intravenous Stopped 02/04/17 1345)  acetaminophen (TYLENOL) tablet 650 mg (650 mg Oral Given 02/04/17 1105)  sodium chloride 0.9 % bolus 1,000 mL (0 mLs Intravenous Stopped 02/04/17 1200)  0.9 %  sodium chloride infusion ( Intravenous New Bag/Given 02/04/17 1605)     Initial Impression / Assessment and Plan / ED Course  I have reviewed the triage vital signs and the nursing notes.  Pertinent labs & imaging results that were available during my care of the patient were reviewed by me and considered in my medical decision making (see chart for details).     21 yo M with h/o IgA nephropathy here with diffuse abd pain, n/v despite outpt Zofran. Lab work shows worsening AKI, c/w likely dehydration. CT stone study neg for stone or other intra-abd pathology. VS  improved with IVF here. Discussed with Dr. Hyman Hopes given hematuria, worsening renal function - likely 2/2 viral illness per his discussion, recommends tx of underlying illness, no steroids indicated at this time. Given persistent n/v despite outpt zofran with worsening dehydration, will admit for obs.  Final Clinical Impressions(s) / ED Diagnoses   Final diagnoses:  Intractable vomiting with nausea, unspecified vomiting type  Dehydration  AKI (acute kidney injury) (HCC)    New Prescriptions New Prescriptions   No medications on file     Shaune Pollack, MD 02/04/17 2015

## 2017-02-05 DIAGNOSIS — R112 Nausea with vomiting, unspecified: Secondary | ICD-10-CM | POA: Diagnosis not present

## 2017-02-05 LAB — CBC
HCT: 38.1 % — ABNORMAL LOW (ref 39.0–52.0)
HEMOGLOBIN: 12.8 g/dL — AB (ref 13.0–17.0)
MCH: 30.7 pg (ref 26.0–34.0)
MCHC: 33.6 g/dL (ref 30.0–36.0)
MCV: 91.4 fL (ref 78.0–100.0)
PLATELETS: 138 10*3/uL — AB (ref 150–400)
RBC: 4.17 MIL/uL — ABNORMAL LOW (ref 4.22–5.81)
RDW: 12.1 % (ref 11.5–15.5)
WBC: 4.4 10*3/uL (ref 4.0–10.5)

## 2017-02-05 LAB — COMPREHENSIVE METABOLIC PANEL
ALBUMIN: 3.2 g/dL — AB (ref 3.5–5.0)
ALK PHOS: 61 U/L (ref 38–126)
ALT: 14 U/L — ABNORMAL LOW (ref 17–63)
ANION GAP: 7 (ref 5–15)
AST: 17 U/L (ref 15–41)
BILIRUBIN TOTAL: 0.9 mg/dL (ref 0.3–1.2)
BUN: 10 mg/dL (ref 6–20)
CALCIUM: 8.7 mg/dL — AB (ref 8.9–10.3)
CO2: 24 mmol/L (ref 22–32)
CREATININE: 1.13 mg/dL (ref 0.61–1.24)
Chloride: 106 mmol/L (ref 101–111)
GFR calc Af Amer: >60 mL/min (ref >60)
GFR calc non Af Amer: >60 mL/min (ref >60)
GLUCOSE: 83 mg/dL (ref 65–99)
Potassium: 3.8 mmol/L (ref 3.5–5.1)
Sodium: 137 mmol/L (ref 135–145)
TOTAL PROTEIN: 5.8 g/dL — AB (ref 6.5–8.1)

## 2017-02-05 LAB — HIV ANTIBODY (ROUTINE TESTING W REFLEX): HIV Screen 4th Generation wRfx: NONREACTIVE

## 2017-02-05 LAB — PROTIME-INR
INR: 1.09
PROTHROMBIN TIME: 14 s (ref 11.4–15.2)

## 2017-02-05 NOTE — Discharge Instructions (Signed)

## 2017-02-05 NOTE — Discharge Summary (Signed)
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 Take enough fluids.   Diet recommendation: regular diet  Activity: The patient is advised to gradually reintroduce usual activities.  Discharge Condition: good  Code Status: full code  History of present illness: As per the H and P dictated on admission, "Johnny Conner is a 21 y.o. male with medical history significant for IgA nephropathy pre renal biopsy diagnosed in 2016, not seen since diagnosis, and not on steroids, presenting with 3-4 day history of generalized fatigue, subjective fevers, nausea and vomiting, with lower abdominal cramping. He denies any diarrhea. The patient denies any sick contacts. Of note, the patient also reported a diffuse myalgias, nasal congestion, since Sunday. The patient had been seen at urgent care yesterday, and this was felt to be a viral illness, supportive care was recommended. However, as the patient could not eat or drink due to the symptoms, and continued to vomit this morning, he presented to the ED for further evaluation. No headaches or confusion. He denies any food poisoning, or sick contacts. He denies any dysuria, but he does have increasingly dark, tea colored urine urine. He denies any flank pain. He denies taking any over-the-counter supplements."  Hospital Course:  Summary of his active problems in the hospital is as following. Intractable Nausea and vomiting. Likely viral syndrome. Afebrile, nontoxic appearing. Exam benign. . No abdominal pain. Lactic acid 0.8 White count is normal at 7.7, no differential has been drawn.  LIpase normal .Cr  1.33 around baseline CT renal stone on 10/10 without acute finding. Received IVF and antiemetics with some relief.   Currently no abdominal pain, tolerating regular diet, no nausea no vomiting no dizziness no lightheadedness nor diarrhea. Most likely food poisoning versus viral gastroenteritis. Feels safe to discharge home.  Hematuria in the setting of viral illness in a patient with IgA nephropathy diagnosed per biopsy in 2016 and treated with steroids. UA with proteinuria and hemoglobinuria,   Neg for nitrites and WBC EDP had a phone discussion with Dr. Hyman Hopes, nephrology, who recommended IV fluids, and no need for steroids at this time. suspecting a viral syndrome, in which case hematuria is not uncommon. Currently resolved.  All other chronic medical condition were stable during the hospitalization.  Patient was ambulatory without any assistance. On the day of the discharge the patient's vitals were stable, and no other acute medical condition were reported by patient. the patient was felt safe to be discharge at home with family.  Procedures and Results:  none   Consultations:  none  DISCHARGE MEDICATION: There are no discharge medications for this patient.  No Known Allergies Discharge Instructions    Diet general    Complete by:  As directed    Discharge instructions    Complete by:  As directed    It is important that you read following instructions as well as go over your medication list with RN to help you understand your care after this hospitalization.  Discharge Instructions: Please follow-up with PCP in one week  Please request your primary care physician to go over all Hospital Tests and Procedure/Radiological results  at the follow up,  Please get all Hospital records sent to your PCP by signing hospital release before you go home.   You were cared for by a hospitalist during your hospital stay. If you have any questions about your discharge medications or the  care you received while you were in the hospital after you are discharged, you can call the unit and ask to speak with the hospitalist on call if the hospitalist that took care of you is not available.  Once you are discharged, your primary care physician will handle any further medical issues. Please note that NO REFILLS for any discharge medications will be authorized once you are discharged, as it is imperative that you return to your primary care physician (or establish a relationship with a primary care physician if you do not have one) for your aftercare needs so that they can reassess your need for medications and monitor your lab values. You Must read complete instructions/literature along with all the possible adverse reactions/side effects for all the Medicines you take and that have been prescribed to you. Take any new Medicines after you have completely understood and accept all the possible adverse reactions/side effects. Wear Seat belts while driving. If you have smoked or chewed Tobacco in the last 2 yrs please stop smoking and/or stop any Recreational drug use.   Increase activity slowly    Complete by:  As directed      Discharge Exam: Filed Weights   02/04/17 2235 02/05/17 0601  Weight: 71.1 kg (156 lb 12.8 oz) 69.5 kg (153 lb 3.2 oz)   Vitals:   02/05/17 0601 02/05/17 1408  BP: 101/65 105/69  Pulse: 71 86  Resp: 18 18  Temp: 98.3 F (36.8 C) 98.6 F (37 C)  SpO2: 99% 100%   General: Appear in no distress, no Rash; Oral Mucosa moist. Cardiovascular: S1 and S2 Present, no Murmur, no JVD Respiratory: Bilateral Air entry present and Clear to Auscultation, no Crackles, no wheezes Abdomen: Bowel Sound present, Soft and no tenderness Extremities: no Pedal edema, no calf tenderness Neurology: Grossly no focal neuro deficit.  The results of significant diagnostics from this hospitalization (including imaging, microbiology, ancillary and laboratory) are listed below for  reference.    Significant Diagnostic Studies: Ct Renal Stone Study  Result Date: 02/04/2017 CLINICAL DATA:  Hematuria with unknown causes. Lower abdominal pain. History of IgA nephropathy. EXAM: CT ABDOMEN AND PELVIS WITHOUT CONTRAST TECHNIQUE: Multidetector CT imaging of the abdomen and pelvis was performed following the standard protocol without IV contrast. COMPARISON:  12/12/2014 FINDINGS: Lower chest:  No contributory findings. Hepatobiliary: No focal liver abnormality.No evidence of biliary obstruction or stone. Pancreas: Unremarkable. Spleen: Unremarkable. Adrenals/Urinary Tract: Negative adrenals. No hydronephrosis or stone. There is a scar-like excavation in the right interpolar renal cortex where there was a previously seen cystic density. Larger right posterior renal cystic density measuring approximately 1.3 cm. There is a exophytic high-density nodule in the lower pole left kidney that is stable at 5 mm, usually hemorrhagic/proteinaceous cyst. Unremarkable bladder. Stomach/Bowel:  No obstruction. No appendicitis. Vascular/Lymphatic: No acute vascular abnormality. No mass or adenopathy. Reproductive:No pathologic findings. Other: No ascites or pneumoperitoneum. Musculoskeletal: No acute abnormalities. IMPRESSION: 1. No acute finding.  No hydronephrosis or urolithiasis. 2. Small scar like defect in the right kidney where there was cystic density seen in 2016. 3. Stable small presumed proteinaceous cyst in the lower pole left kidney. 13 mm right renal cystic density. Electronically Signed   By: Marja Kays  Watts M.D.   On: 02/04/2017 12:49    Microbiology: No results found for this or any previous visit (from the past 240 hour(s)).   Labs: CBC:  Recent Labs Lab 02/04/17 0907 02/05/17 0718  WBC 7.7 4.4  HGB 14.9 12.8*  HCT 43.6 38.1*  MCV 91.2 91.4  PLT 170 138*   Basic Metabolic Panel:  Recent Labs Lab 02/03/17 1802 02/04/17 0907 02/05/17 0718  NA 137 136 137  K 4.2 4.1 3.8    CL 100 99* 106  CO2 GLUCOSE 84 107* 83  BUN CREATININE 1.22 1.33* 1.13  CALCIUM 9.1 9.3 8.7*   Liver Function Tests:  Recent Labs Lab 02/03/17 1802 02/04/17 0907 02/05/17 0718  AST ALT 14 21 14*  ALKPHOS  --  75 61  BILITOT 0.8 1.4* 0.9  PROT 6.8 7.0 5.8*  ALBUMIN  --  4.1 3.2*    Recent Labs Lab 02/04/17 0907  LIPASE 22   Time spent: 35 minutes  Signed:  Anetta Olvera  [facility]  02/05/2017  , 2:22 PM

## 2017-02-05 NOTE — Progress Notes (Signed)
Johnny Conner to be D/C'd Home per MD order.  Discussed with the patient and all questions fully answered.  VSS, Skin clean, dry and intact without evidence of skin break down, no evidence of skin tears noted. IV catheter discontinued intact. Site without signs and symptoms of complications. Dressing and pressure applied.  An After Visit Summary was printed and given to the patient. Patient received prescription.  D/c education completed with patient/family including follow up instructions, medication list, d/c activities limitations if indicated, with other d/c instructions as indicated by MD - patient able to verbalize understanding, all questions fully answered.   Patient instructed to return to ED, call 911, or call MD for any changes in condition.   Patient escorted via WC, and D/C home via private auto.  Melvenia Needles 02/05/2017 3:23 PM

## 2017-03-11 ENCOUNTER — Observation Stay (HOSPITAL_COMMUNITY)
Admission: EM | Admit: 2017-03-11 | Discharge: 2017-03-12 | Disposition: A | Discharge status: Home / Self Care | Payer: BLUE CROSS/BLUE SHIELD | Attending: Internal Medicine | Admitting: Internal Medicine

## 2017-03-11 ENCOUNTER — Emergency Department (HOSPITAL_COMMUNITY): Payer: BLUE CROSS/BLUE SHIELD

## 2017-03-11 ENCOUNTER — Encounter (HOSPITAL_COMMUNITY): Payer: Self-pay | Admitting: Emergency Medicine

## 2017-03-11 ENCOUNTER — Other Ambulatory Visit: Payer: Self-pay

## 2017-03-11 DIAGNOSIS — J189 Pneumonia, unspecified organism: Secondary | ICD-10-CM | POA: Diagnosis not present

## 2017-03-11 DIAGNOSIS — I959 Hypotension, unspecified: Secondary | ICD-10-CM | POA: Insufficient documentation

## 2017-03-11 DIAGNOSIS — N028 Recurrent and persistent hematuria with other morphologic changes: Secondary | ICD-10-CM

## 2017-03-11 DIAGNOSIS — A419 Sepsis, unspecified organism: Secondary | ICD-10-CM

## 2017-03-11 DIAGNOSIS — Z808 Family history of malignant neoplasm of other organs or systems: Secondary | ICD-10-CM | POA: Diagnosis not present

## 2017-03-11 DIAGNOSIS — F17211 Nicotine dependence, cigarettes, in remission: Secondary | ICD-10-CM

## 2017-03-11 DIAGNOSIS — J181 Lobar pneumonia, unspecified organism: Secondary | ICD-10-CM

## 2017-03-11 DIAGNOSIS — Z8701 Personal history of pneumonia (recurrent): Secondary | ICD-10-CM | POA: Diagnosis present

## 2017-03-11 DIAGNOSIS — Z87891 Personal history of nicotine dependence: Secondary | ICD-10-CM | POA: Insufficient documentation

## 2017-03-11 HISTORY — DX: Pneumonia, unspecified organism: J18.9

## 2017-03-11 HISTORY — DX: Unspecified kidney failure: N19

## 2017-03-11 HISTORY — DX: Gastro-esophageal reflux disease without esophagitis: K21.9

## 2017-03-11 LAB — CBC WITH DIFFERENTIAL/PLATELET
BASOS ABS: 0 10*3/uL (ref 0.0–0.1)
BASOS PCT: 0 %
EOS ABS: 0.3 10*3/uL (ref 0.0–0.7)
EOS PCT: 2 %
HCT: 42.2 % (ref 39.0–52.0)
HEMOGLOBIN: 14.5 g/dL (ref 13.0–17.0)
Lymphocytes Relative: 10 %
Lymphs Abs: 1.5 10*3/uL (ref 0.7–4.0)
MCH: 30.5 pg (ref 26.0–34.0)
MCHC: 34.4 g/dL (ref 30.0–36.0)
MCV: 88.8 fL (ref 78.0–100.0)
Monocytes Absolute: 0.9 10*3/uL (ref 0.1–1.0)
Monocytes Relative: 6 %
Neutro Abs: 12.7 10*3/uL — ABNORMAL HIGH (ref 1.7–7.7)
Neutrophils Relative %: 82 %
PLATELETS: 196 10*3/uL (ref 150–400)
RBC: 4.75 MIL/uL (ref 4.22–5.81)
RDW: 12.1 % (ref 11.5–15.5)
WBC: 15.4 10*3/uL — AB (ref 4.0–10.5)

## 2017-03-11 LAB — BASIC METABOLIC PANEL
ANION GAP: 7 (ref 5–15)
BUN: 14 mg/dL (ref 6–20)
CO2: 27 mmol/L (ref 22–32)
Calcium: 9.8 mg/dL (ref 8.9–10.3)
Chloride: 105 mmol/L (ref 101–111)
Creatinine, Ser: 1.15 mg/dL (ref 0.61–1.24)
Glucose, Bld: 102 mg/dL — ABNORMAL HIGH (ref 65–99)
POTASSIUM: 3.5 mmol/L (ref 3.5–5.1)
SODIUM: 139 mmol/L (ref 135–145)

## 2017-03-11 LAB — URINALYSIS, ROUTINE W REFLEX MICROSCOPIC
Bacteria, UA: NONE SEEN
Bilirubin Urine: NEGATIVE
GLUCOSE, UA: NEGATIVE mg/dL
Ketones, ur: NEGATIVE mg/dL
LEUKOCYTES UA: NEGATIVE
Nitrite: NEGATIVE
PROTEIN: 30 mg/dL — AB
SPECIFIC GRAVITY, URINE: 1.029 (ref 1.005–1.030)
pH: 5 (ref 5.0–8.0)

## 2017-03-11 LAB — LIPASE, BLOOD: Lipase: 21 U/L (ref 11–51)

## 2017-03-11 LAB — I-STAT CG4 LACTIC ACID, ED: LACTIC ACID, VENOUS: 1.06 mmol/L (ref 0.5–1.9)

## 2017-03-11 LAB — HEPATIC FUNCTION PANEL
ALK PHOS: 63 U/L (ref 38–126)
ALT: 32 U/L (ref 17–63)
AST: 26 U/L (ref 15–41)
Albumin: 3.6 g/dL (ref 3.5–5.0)
BILIRUBIN DIRECT: 0.1 mg/dL (ref 0.1–0.5)
Indirect Bilirubin: 1.1 mg/dL — ABNORMAL HIGH (ref 0.3–0.9)
TOTAL PROTEIN: 6 g/dL — AB (ref 6.5–8.1)
Total Bilirubin: 1.2 mg/dL (ref 0.3–1.2)

## 2017-03-11 IMAGING — DX DG CHEST 2V
2 series · 2 of 2 positions shown · non-contrast
Comparison: None.

CLINICAL DATA: Acute onset of generalized chest congestion and
persistent cough.

EXAM:
CHEST  2 VIEW

[w chest pa]
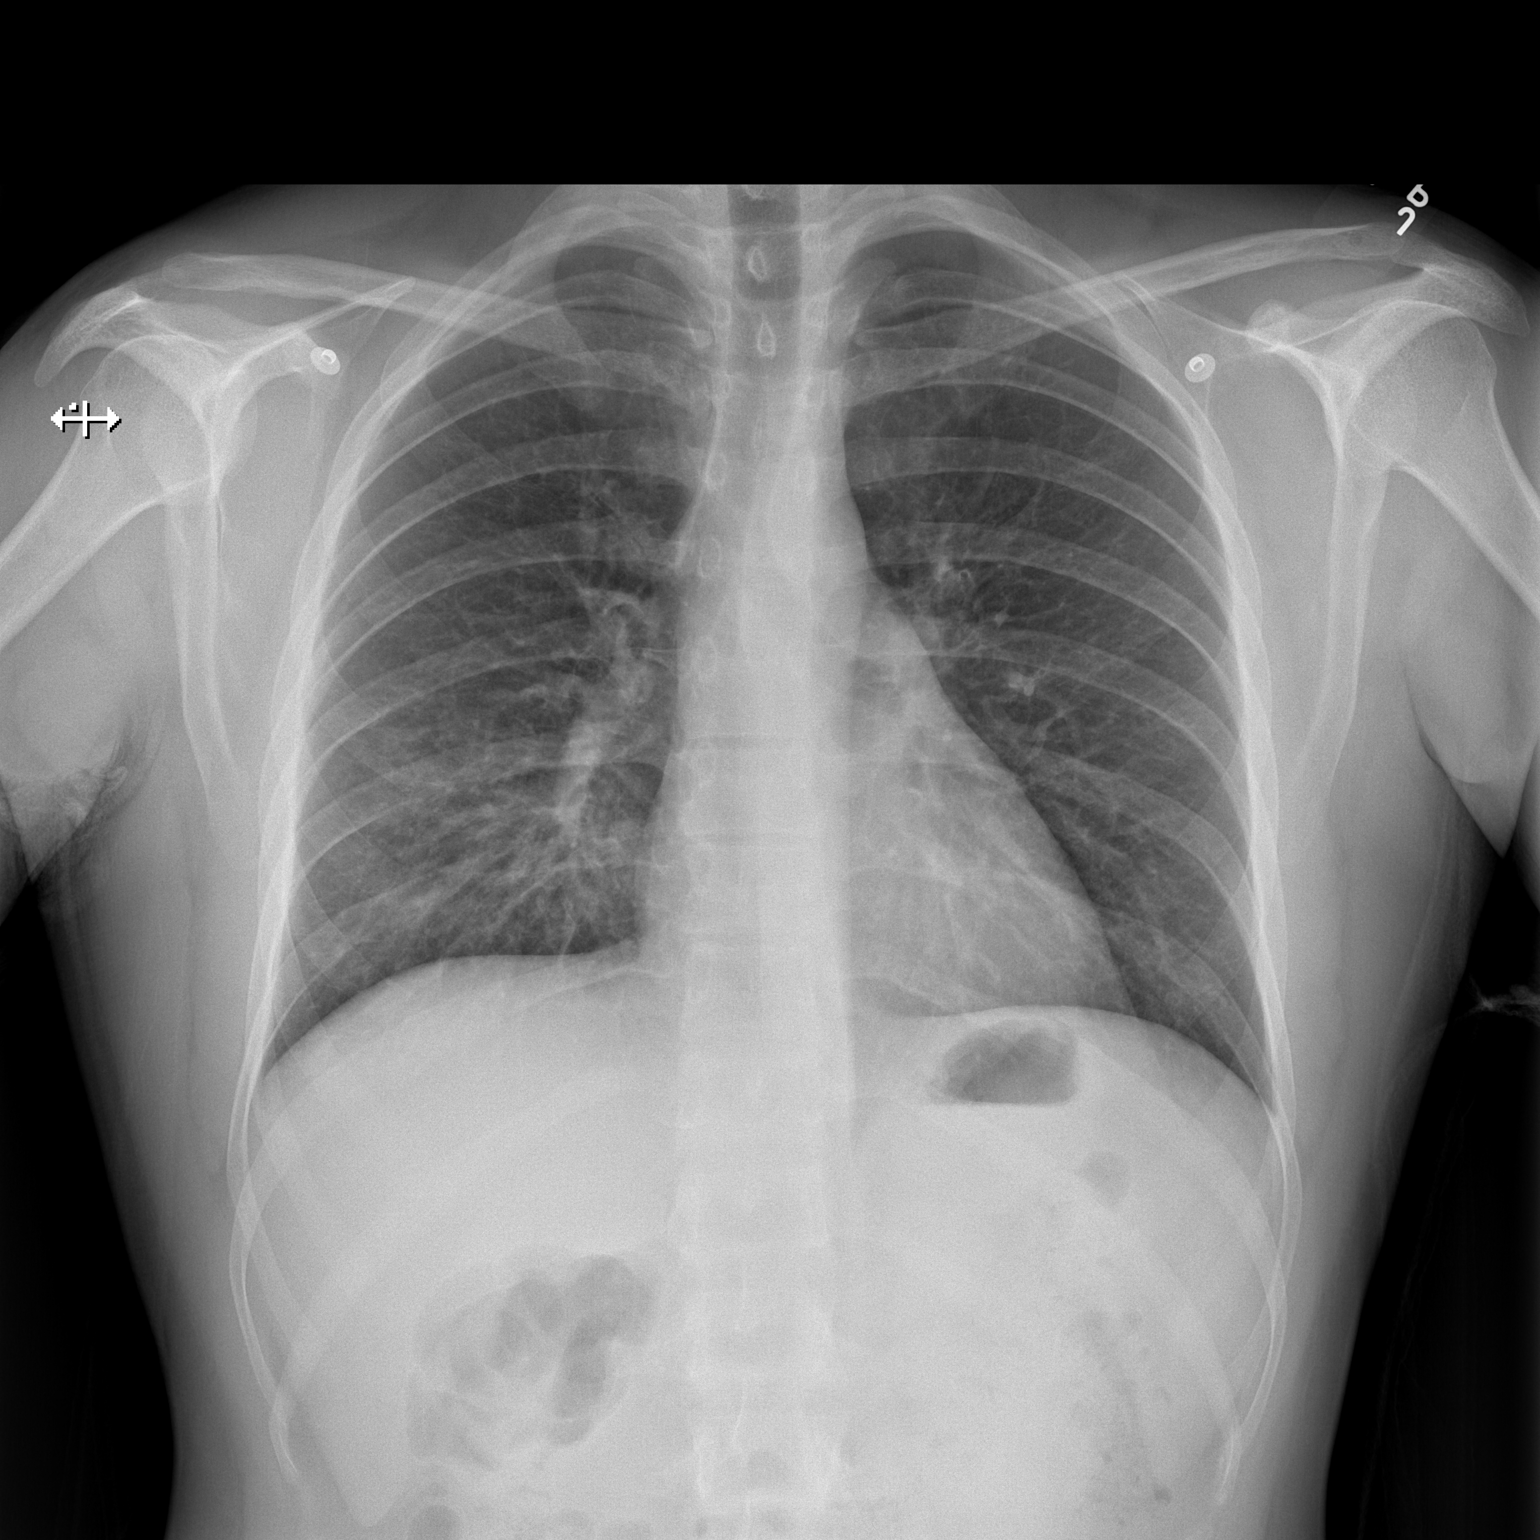

[w chest lat]
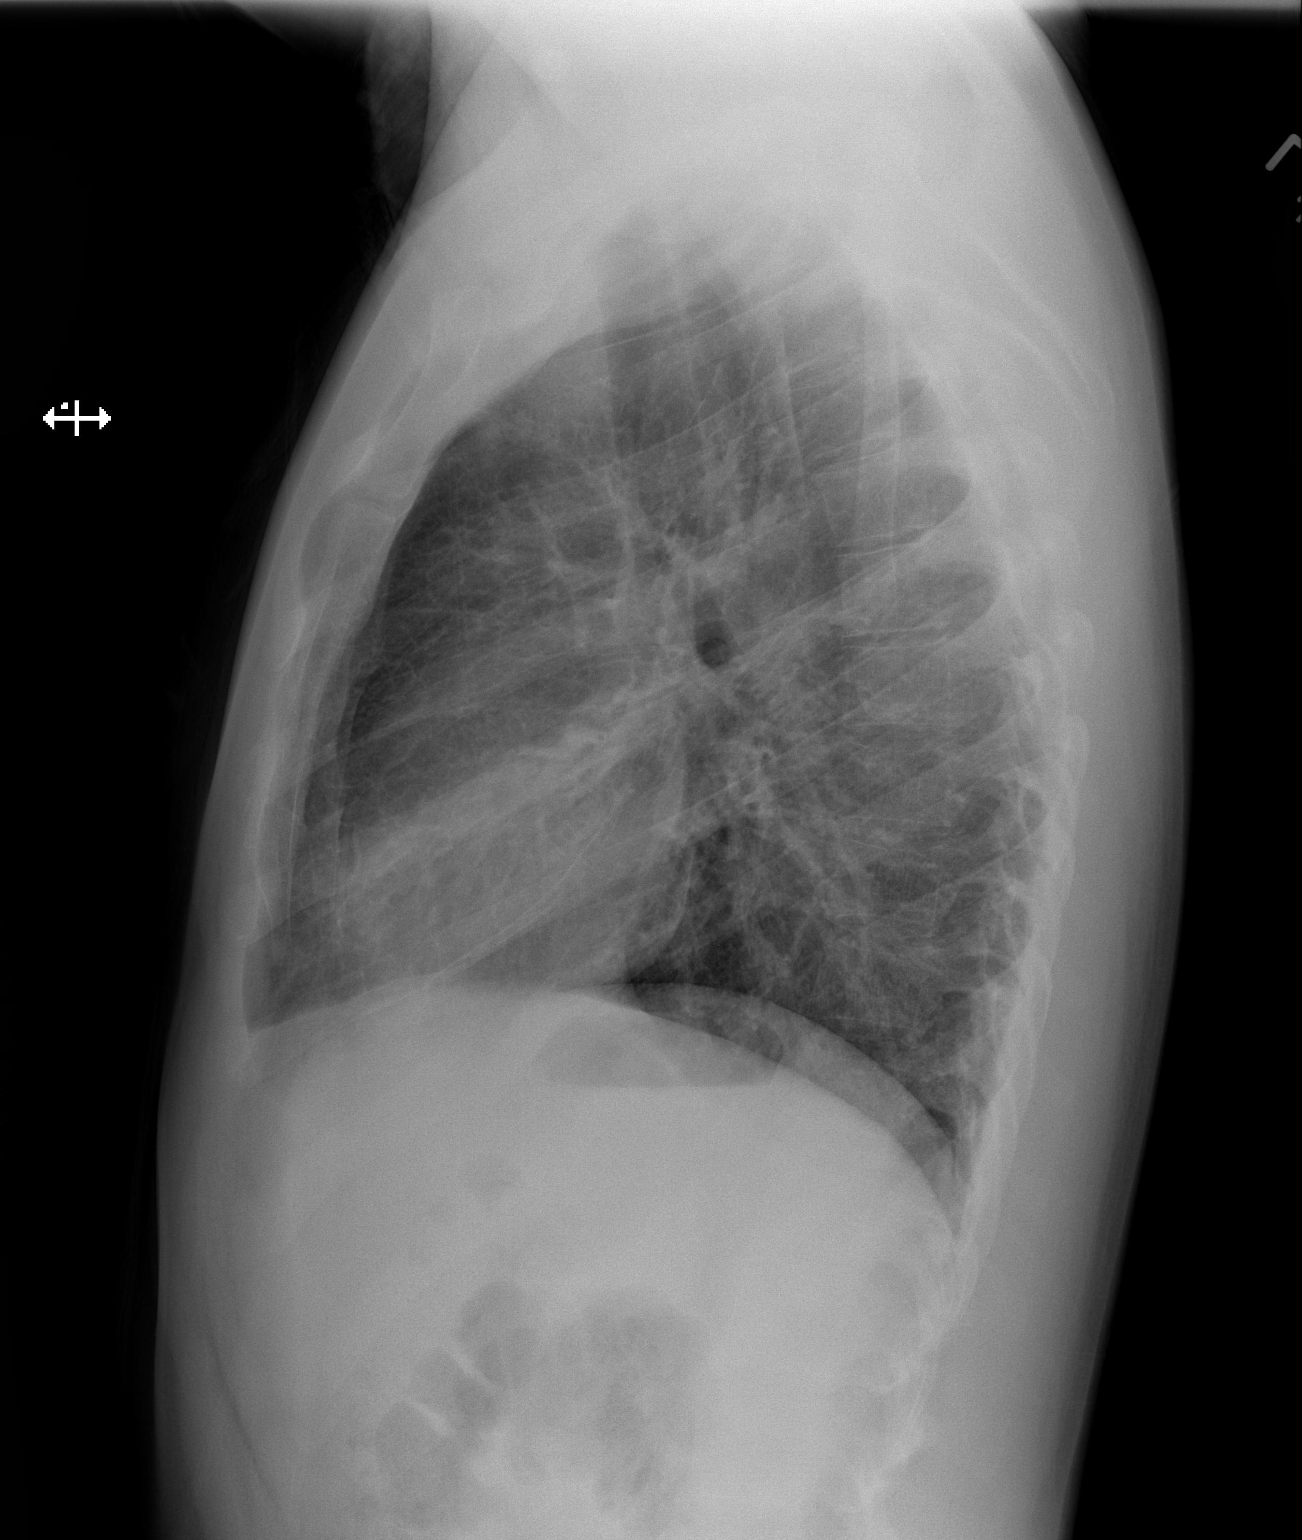

[2 of 2 positions shown; findings below may reference images not displayed]

FINDINGS: The lungs are well-aerated. Minimal hazy opacity at the right lung
base raises concern for pneumonia. There is no evidence of pleural
effusion or pneumothorax.

The heart is normal in size; the mediastinal contour is within
normal limits. No acute osseous abnormalities are seen.
IMPRESSION: Minimal hazy opacity at the right lung base raises concern for
pneumonia.

## 2017-03-11 MED ORDER — SODIUM CHLORIDE 0.9 % IV BOLUS (SEPSIS)
1000.0000 mL | Freq: Once | INTRAVENOUS | Status: AC
  Administered 2017-03-11: 1000 mL via INTRAVENOUS

## 2017-03-11 MED ORDER — DEXTROSE 5 % IV SOLN
1.0000 g | Freq: Once | INTRAVENOUS | Status: AC
  Administered 2017-03-11: 1 g via INTRAVENOUS
  Filled 2017-03-11: qty 10

## 2017-03-11 MED ORDER — ACETAMINOPHEN 650 MG RE SUPP: 650.0000 mg | Freq: Four times a day (QID) | RECTAL | Status: DC | PRN

## 2017-03-11 MED ORDER — SODIUM CHLORIDE 0.9 % IV BOLUS (SEPSIS): 1000.0000 mL | Freq: Once | INTRAVENOUS | Status: DC

## 2017-03-11 MED ORDER — DEXTROSE 5 % IV SOLN
500.0000 mg | Freq: Every day | INTRAVENOUS | Status: DC
  Administered 2017-03-12: 500 mg via INTRAVENOUS
  Filled 2017-03-11: qty 500

## 2017-03-11 MED ORDER — DEXTROSE 5 % IV SOLN
1.0000 g | Freq: Every day | INTRAVENOUS | Status: DC
  Administered 2017-03-12: 1 g via INTRAVENOUS
  Filled 2017-03-11: qty 10

## 2017-03-11 MED ORDER — AZITHROMYCIN 500 MG IV SOLR
500.0000 mg | Freq: Once | INTRAVENOUS | Status: AC
  Administered 2017-03-11: 500 mg via INTRAVENOUS
  Filled 2017-03-11: qty 500

## 2017-03-11 MED ORDER — ENOXAPARIN SODIUM 40 MG/0.4ML ~~LOC~~ SOLN
40.0000 mg | SUBCUTANEOUS | Status: DC
  Filled 2017-03-11: qty 0.4

## 2017-03-11 MED ORDER — ONDANSETRON HCL 4 MG/2ML IJ SOLN: 4.0000 mg | Freq: Four times a day (QID) | INTRAMUSCULAR | Status: DC | PRN

## 2017-03-11 MED ORDER — ONDANSETRON HCL 4 MG PO TABS: 4.0000 mg | ORAL_TABLET | Freq: Four times a day (QID) | ORAL | Status: DC | PRN

## 2017-03-11 MED ORDER — SODIUM CHLORIDE 0.9 % IV BOLUS (SEPSIS)
1500.0000 mL | Freq: Once | INTRAVENOUS | Status: AC
  Administered 2017-03-11: 1500 mL via INTRAVENOUS

## 2017-03-11 MED ORDER — ACETAMINOPHEN 325 MG PO TABS
650.0000 mg | ORAL_TABLET | Freq: Once | ORAL | Status: AC
  Administered 2017-03-11: 650 mg via ORAL
  Filled 2017-03-11: qty 2

## 2017-03-11 MED ORDER — ACETAMINOPHEN 325 MG PO TABS: 650.0000 mg | ORAL_TABLET | Freq: Four times a day (QID) | ORAL | Status: DC | PRN

## 2017-03-11 MED ORDER — POTASSIUM CHLORIDE IN NACL 20-0.9 MEQ/L-% IV SOLN
INTRAVENOUS | Status: AC
  Administered 2017-03-11: 125 mL/h via INTRAVENOUS
  Administered 2017-03-12: 02:00:00 via INTRAVENOUS
  Filled 2017-03-11 (×3): qty 1000

## 2017-03-11 NOTE — ED Triage Notes (Signed)
Patient woke up this morning with persistent dry cough and chest congestion. Denies fever or chills .

## 2017-03-11 NOTE — ED Notes (Signed)
Pt aware of the need of a urine sample. Pt states he is unable to provide sample at this time. Will check again later.

## 2017-03-11 NOTE — ED Notes (Signed)
Pt sleeping at this time. Family member in room.

## 2017-03-11 NOTE — ED Notes (Signed)
Discussed plan of action with MD.

## 2017-03-11 NOTE — H&P (Signed)
Date: 03/11/2017               Patient Name:  Johnny Conner MRN: [id number]  DOB: 04-Aug-1995 Age / Sex: 21 y.o., male   PCP: Patient, No Pcp Per         Medical Service: Internal Medicine Teaching Service         Attending Physician: Dr. Earl LagosNarendra, Nischal, MD    First Contact: Dr. Anthonette LegatoHarden Pager: [phone]  Second Contact: Dr. Johnny BridgeSaraiya  Pager: [phone]       After Hours (After 5p/  First Contact Pager: [phone]  weekends / holidays): Second Contact Pager: [phone]   Chief Complaint: Cough  History of Present Illness: Johnny Conner is a 21 yo M with past medical history of IgA nephropathy who presented to the ED with complaints of cough.  Patient states Johnny Conner has had a dry cough for several weeks with intermittent whitish production which acutely worsened early this morning at 4 AM.  This morning Johnny Conner also experienced nausea and vomiting (brown), the patient feels both nausea and his cough contributed to the vomiting.  Johnny Conner denies abdominal pain, diarrhea, fevers.  Johnny Conner notes discomfort in his his back, neck, and chest as well as lightheadedness on arrival which Johnny Conner attributed to his cough, this has improved since being in the ED.  Johnny Conner has no known sick contacts, no joint pain, no rash, has not received flu shot.  Johnny Conner had taken over-the-counter DayQuil, NyQuil for cough relief prior to this morning.  Johnny Conner does not regularly follow with a provider for primary care for his IgA nephropathy.  In the ED, Johnny Conner was afebrile, heart rate 88, low blood pressures to 77/53, saturating well on room air.  Chest x-ray revealed a hazy opacity in right lung base.  Labs significant for white blood cell count 15.4, lactic acid 1.06, U/A with hematuria and proteinuria, negative leukocytes and nitrites.  Code sepsis was activated and the patient received 3.5 L IV fluid, blood cultures drawn, and started on ceftriaxone and azithromycin for potential pneumonia and admitted for further management.  Meds:  Current Meds  Medication  Sig  . Biotin 1 MG CAPS Take 1 mg daily by mouth.  . DM-Doxylamine-Acetaminophen (NYQUIL COLD & FLU PO) Take 1 capsule at bedtime as needed by mouth (sleep, pain).  . Pseudoephedrine-APAP-DM (DAYQUIL MULTI-SYMPTOM COLD/FLU PO) Take 1 capsule daily as needed by mouth (cold sx).     Allergies: Allergies as of 03/11/2017  . (No Known Allergies)   Past Medical History:  Diagnosis Date  . Pyelonephritis 2012   treated with 3 days of unspecified abx    Family History:  Family History  Problem Relation Age of Onset  . Skin cancer Paternal Grandfather      Social History:  Social History   Tobacco Use  . Smoking status: Former Smoker    Packs/day: 0.10    Years: 1.00    Pack years: 0.10    Types: Cigarettes  . Smokeless tobacco: Never Used  . Tobacco comment: Quit x 1 month.  Substance Use Topics  . Alcohol use: No    Alcohol/week: 0.0 - 0.6 oz    Comment: Last drink 4 months ago.  . Drug use: No   Sexually active, 1 male partner, intermittent condom use   Review of Systems: A complete ROS was negative except as per HPI.   Physical Exam: Blood pressure 100/70, pulse (!) 59, temperature 98.7 F (37.1 C), temperature source Oral, resp. rate  19, height 5\' 9"  (1.753 m), weight 165 lb (74.8 kg), SpO2 99 %. Physical Exam  Constitutional: Johnny Conner is oriented to person, place, and time. Johnny Conner appears well-developed and well-nourished.  HENT:  Head: Normocephalic and atraumatic.  Mouth/Throat: Oropharynx is clear and moist. No oropharyngeal exudate.  Large uvula, no significant erythema or exudate   Eyes: Pupils are equal, round, and reactive to light.  Cardiovascular: Normal rate, regular rhythm and normal heart sounds.  Pulmonary/Chest: Effort normal and breath sounds normal. No respiratory distress. Johnny Conner has no wheezes. Johnny Conner has no rales.  Abdominal: Soft. Bowel sounds are normal. Johnny Conner exhibits no distension. There is no tenderness.  Musculoskeletal: Johnny Conner exhibits no edema.    Neurological: Johnny Conner is alert and oriented to person, place, and time.  Skin: Skin is warm and dry.      CXR: personally reviewed my interpretation is slight haziness to R mid or lower lung.   Assessment & Plan by Problem:  Hypotension, ? R Community Acquired Pna Patient presents with an acute worsening of cough with associated nausea and vomiting and a potential opacity in right lung on chest x-ray.  Johnny Conner does have leukocytosis to 15.4, lactic acid negative and Johnny Conner is afebrile.  Influenza is a consideration though would expect patient to be febrile, more generalized and persistent myalgias. His blood pressure improved after receiving IV fluids and was not symptomatic during admission evaluation, Johnny Conner is unsure of his baseline blood pressure.  Johnny Conner was started on ceftriaxone and azithromycin for presumed community-acquired pneumonia. --IVF 125 ml/hr, monitor vital signs, orthostatic vitals  --Cont azithromycin, ceftriaxone --CBC --F/u blood cultures and urine cx   IgA Nephropathy  Patient has a history of IgA nephropathy, not currently on any medications.  States Johnny Conner notices intermittent hematuria and proteinuria with change in urine characteristics.  U/A on admission notes numerous red blood cells and protein.  Chronic protein loss with IgA nephropathy mainly to the relative decrease in normal immune function. --BMP     Dispo: Admit patient to Observation with expected length of stay less than 2 midnights.  Signed: Ginger CarneHarden, Zander Ingham, MD 03/11/2017, 6:05 PM  Pager: [phone]

## 2017-03-11 NOTE — ED Provider Notes (Signed)
[facility] EMERGENCY DEPARTMENT Provider Note   CSN: [id number] Arrival date & time: 03/11/17  0505     History   Chief Complaint Chief Complaint  Patient presents with  . Cough    Pneumonia    HPI ANGELES PAOLUCCI is a 21 y.o. male history of IgA nephropathy with chronic hematuria, here presenting with epigastric pain, nausea, chills, cough.  Patient has been having dry cough for the last several days as well as subjective chills.  Denies actual fevers at home.  He has been nauseated and but denies any abdominal pain.  He was admitted recently for intractable vomiting and was given IV fluids and was sent home.  He was noted to be hypotensive in triage.   The history is provided by the patient.    Past Medical History:  Diagnosis Date  . Pyelonephritis 2012   treated with 3 days of unspecified abx    Patient Active Problem List   Diagnosis Date Noted  . Abdominal pain 02/04/2017  . 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 Nausea and vomiting 12/14/2014    History reviewed. No pertinent surgical history.     Home Medications    Prior to Admission medications   Medication Sig Start Date End Date Taking? Authorizing Provider  Biotin 1 MG CAPS Take 1 mg daily by mouth.   Yes [provider]  DM-Doxylamine-Acetaminophen (NYQUIL COLD & FLU PO) Take 1 capsule at bedtime as needed by mouth (sleep, pain).   Yes [provider]  Pseudoephedrine-APAP-DM (DAYQUIL MULTI-SYMPTOM COLD/FLU PO) Take 1 capsule daily as needed by mouth (cold sx).   Yes [provider]    Family History No family history on file.  Social History Social History   Tobacco Use  . Smoking status: Former Smoker    Packs/day: 0.10    Years: 1.00    Pack years: 0.10    Types: Cigarettes  . Smokeless tobacco: Never Used  . Tobacco comment: Quit x 1 month.  Substance Use Topics  . Alcohol use: No    Alcohol/week: 0.0 - 0.6 oz  . Drug  use: No     Allergies   Patient has no known allergies.   Review of Systems Review of Systems  Respiratory: Positive for cough.   All other systems reviewed and are negative.    Physical Exam Updated Vital Signs BP (!) 85/56 (BP Location: Left Arm)   Pulse 68   Temp 98.7 F (37.1 C) (Oral)   Resp 17   Ht 5\' 9"  (1.753 m)   Wt 74.8 kg (165 lb)   SpO2 99%   BMI 24.37 kg/m   Physical Exam  Constitutional: He is oriented to person, place, and time.  Uncomfortable, dehydrated   HENT:  Head: Normocephalic.  MM dry   Eyes: Conjunctivae and EOM are normal. Pupils are equal, round, and reactive to light.  Neck: Normal range of motion. Neck supple.  Cardiovascular: Regular rhythm and normal heart sounds.  Tachycardic   Pulmonary/Chest:  Crackles R base   Abdominal: Soft. Bowel sounds are normal. He exhibits no distension. There is no tenderness. There is no guarding.  Musculoskeletal: Normal range of motion.  Neurological: He is alert and oriented to person, place, and time. No cranial nerve deficit. Coordination normal.  Skin: Skin is warm.  Psychiatric: He has a normal mood and affect.  Nursing note and vitals reviewed.    ED Treatments / Results  Labs (all labs  ordered are listed, but only abnormal results are displayed) Labs Reviewed  CBC WITH DIFFERENTIAL/PLATELET - Abnormal; Notable for the following components:      Result Value   WBC 15.4 (*)    Neutro Abs 12.7 (*)    All other components within normal limits  BASIC METABOLIC PANEL - Abnormal; Notable for the following components:   Glucose, Bld 102 (*)    All other components within normal limits  URINALYSIS, ROUTINE W REFLEX MICROSCOPIC - Abnormal; Notable for the following components:   APPearance HAZY (*)    Hgb urine dipstick MODERATE (*)    Protein, ur 30 (*)    Squamous Epithelial / LPF 0-5 (*)    All other components within normal limits  HEPATIC FUNCTION PANEL - Abnormal; Notable for the  following components:   Total Protein 6.0 (*)    Indirect Bilirubin 1.1 (*)    All other components within normal limits  CULTURE, BLOOD (ROUTINE X 2)  CULTURE, BLOOD (ROUTINE X 2)  URINE CULTURE  LIPASE, BLOOD  I-STAT CG4 LACTIC ACID, ED    EKG  EKG Interpretation None       Radiology Dg Chest 2 View  Result Date: 03/11/2017 CLINICAL DATA:  Acute onset of generalized chest congestion and persistent cough. EXAM: CHEST  2 VIEW COMPARISON:  None. FINDINGS: The lungs are well-aerated. Minimal hazy opacity at the right lung base raises concern for pneumonia. There is no evidence of pleural effusion or pneumothorax. The heart is normal in size; the mediastinal contour is within normal limits. No acute osseous abnormalities are seen. IMPRESSION: Minimal hazy opacity at the right lung base raises concern for pneumonia. Electronically Signed   By: Roanna RaiderJeffery  Chang M.D.   On: 03/11/2017 05:44    Procedures Procedures (including critical care time)  CRITICAL CARE Performed by: Richardean Canalavid H Jakirah Zaun   Total critical care time: 30 minutes  Critical care time was exclusive of separately billable procedures and treating other patients.  Critical care was necessary to treat or prevent imminent or life-threatening deterioration.  Critical care was time spent personally by me on the following activities: development of treatment plan with patient and/or surrogate as well as nursing, discussions with consultants, evaluation of patient's response to treatment, examination of patient, obtaining history from patient or surrogate, ordering and performing treatments and interventions, ordering and review of laboratory studies, ordering and review of radiographic studies, pulse oximetry and re-evaluation of patient's condition.   Medications Ordered in ED Medications  sodium chloride 0.9 % bolus 1,000 mL (0 mLs Intravenous Stopped 03/11/17 0834)  cefTRIAXone (ROCEPHIN) 1 g in dextrose 5 % 50 mL IVPB (0 g  Intravenous Stopped 03/11/17 0913)  azithromycin (ZITHROMAX) 500 mg in dextrose 5 % 250 mL IVPB (0 mg Intravenous Stopped 03/11/17 1112)  sodium chloride 0.9 % bolus 1,500 mL (0 mLs Intravenous Stopped 03/11/17 0947)  acetaminophen (TYLENOL) tablet 650 mg (650 mg Oral Given 03/11/17 0847)  sodium chloride 0.9 % bolus 1,000 mL (1,000 mLs Intravenous New Bag/Given 03/11/17 1130)     Initial Impression / Assessment and Plan / ED Course  I have reviewed the triage vital signs and the nursing notes.  Pertinent labs & imaging results that were available during my care of the patient were reviewed by me and considered in my medical decision making (see chart for details).    Chancy HurterJoshua W Cathey is a 21 y.o. male here with cough, chills, hypotension. Concerned for possible pneumonia. Patient meets sepsis criteria. Will activate  code sepsis, give 30 cc/kg bolus, abx for pneumonia. Will reassess.   1:17 PM Sepsis - Repeat Assessment  Performed at:    1:18 pm   Vitals     Blood pressure (!) 85/56, pulse 68, temperature 98.7 F (37.1 C), temperature source Oral, resp. rate 17, height 5\' 9"  (1.753 m), weight 74.8 kg (165 lb), SpO2 99 %.  Heart:     Regular rate and rhythm  Lungs:    CTA  Capillary Refill:   <2 sec  Peripheral Pulse:   Radial pulse palpable  Skin:     Normal Color  Patient still hypotensive even after 30 cc/kg bolus. Gave extra 1 L NS bolus. WBC 15. Lactate nl. UA showed no UTI, just hematuria. Mentating well. Will admit for obs for hypotension from pneumonia.     Final Clinical Impressions(s) / ED Diagnoses   Final diagnoses:  None    ED Discharge Orders    None       Charlynne PanderYao, Ibraham Levi Hsienta, MD 03/11/17 1319

## 2017-03-11 NOTE — ED Notes (Signed)
ED Provider at bedside. 

## 2017-03-12 ENCOUNTER — Encounter (HOSPITAL_COMMUNITY): Payer: Self-pay | Admitting: *Deleted

## 2017-03-12 ENCOUNTER — Encounter: Payer: Self-pay | Admitting: Internal Medicine

## 2017-03-12 DIAGNOSIS — I959 Hypotension, unspecified: Secondary | ICD-10-CM

## 2017-03-12 DIAGNOSIS — N028 Recurrent and persistent hematuria with other morphologic changes: Secondary | ICD-10-CM | POA: Diagnosis not present

## 2017-03-12 DIAGNOSIS — J189 Pneumonia, unspecified organism: Secondary | ICD-10-CM | POA: Diagnosis not present

## 2017-03-12 DIAGNOSIS — A419 Sepsis, unspecified organism: Secondary | ICD-10-CM | POA: Diagnosis not present

## 2017-03-12 LAB — CBC
HCT: 35.9 % — ABNORMAL LOW (ref 39.0–52.0)
Hemoglobin: 11.9 g/dL — ABNORMAL LOW (ref 13.0–17.0)
MCH: 30.3 pg (ref 26.0–34.0)
MCHC: 33.1 g/dL (ref 30.0–36.0)
MCV: 91.3 fL (ref 78.0–100.0)
PLATELETS: 175 10*3/uL (ref 150–400)
RBC: 3.93 MIL/uL — AB (ref 4.22–5.81)
RDW: 12.4 % (ref 11.5–15.5)
WBC: 7.9 10*3/uL (ref 4.0–10.5)

## 2017-03-12 LAB — BASIC METABOLIC PANEL
Anion gap: 4 — ABNORMAL LOW (ref 5–15)
BUN: 9 mg/dL (ref 6–20)
CALCIUM: 8.2 mg/dL — AB (ref 8.9–10.3)
CO2: 24 mmol/L (ref 22–32)
CREATININE: 0.91 mg/dL (ref 0.61–1.24)
Chloride: 111 mmol/L (ref 101–111)
GFR calc non Af Amer: >60 mL/min (ref >60)
Glucose, Bld: 87 mg/dL (ref 65–99)
Potassium: 3.7 mmol/L (ref 3.5–5.1)
SODIUM: 139 mmol/L (ref 135–145)

## 2017-03-12 LAB — URINE CULTURE: Culture: NO GROWTH

## 2017-03-12 MED ORDER — CEFDINIR 300 MG PO CAPS: 300.0000 mg | ORAL_CAPSULE | Freq: Two times a day (BID) | ORAL | 0 refills | Status: AC

## 2017-03-12 MED ORDER — GUAIFENESIN-DM 100-10 MG/5ML PO SYRP: 5.0000 mL | ORAL_SOLUTION | ORAL | Status: DC | PRN

## 2017-03-12 MED ORDER — AZITHROMYCIN 500 MG PO TABS: 500.0000 mg | ORAL_TABLET | Freq: Every day | ORAL | 0 refills | Status: AC

## 2017-03-12 MED ORDER — AZITHROMYCIN 250 MG PO TABS: 250.0000 mg | ORAL_TABLET | Freq: Every day | ORAL | Status: DC

## 2017-03-12 MED ORDER — GUAIFENESIN-DM 100-10 MG/5ML PO SYRP
ORAL_SOLUTION | ORAL | Status: AC
  Administered 2017-03-12: 02:00:00
  Filled 2017-03-12: qty 5

## 2017-03-12 NOTE — Discharge Summary (Signed)
Name: Johnny Conner MRN: [id number] DOB: 1995-09-01 21 y.o. PCP: Patient, No Pcp Per  Date of Admission: 03/11/2017  5:58 AM Date of Discharge: 03/12/2017 Attending Physician: Earl LagosNarendra, Nischal, MD  Discharge Diagnosis: Active Problems:   CAP (community acquired pneumonia)   Discharge Medications: Allergies as of 03/12/2017   No Known Allergies     Medication List    TAKE these medications   azithromycin 500 MG tablet Commonly known as:  ZITHROMAX Take 1 tablet (500 mg total) daily for 1 dose by mouth. On 11/16   Biotin 1 MG Caps Take 1 mg daily by mouth.   cefdinir 300 MG capsule Commonly known as:  OMNICEF Take 1 capsule (300 mg total) 2 (two) times daily for 3 days by mouth. Start taking on:  03/13/2017   DAYQUIL MULTI-SYMPTOM COLD/FLU PO Take 1 capsule daily as needed by mouth (cold sx).   NYQUIL COLD & FLU PO Take 1 capsule at bedtime as needed by mouth (sleep, pain).       Disposition and follow-up:   [facility] Con[facility].  At the hospital follow up visit please address:  1.  -Resolution of cough sx and completion of abx course -Encourage establishment of PCP and f/u for IgA nephropathy   2.  Labs / imaging needed at time of follow-up: None  3.  Pending labs/ test needing follow-up: Ensure Blood cultures no growth final   Follow-up Appointments:   Hospital Course by problem list: Community Acquired Pneumonia Patient presented with acute worsening of cough and associated chills, nausea, vomiting with leukocytosis and a opacity in the right lung on chest x-ray concerning for community acquired pneumonia.  He was started on ceftriaxone and azithromycin with improvement in his cough and other symptoms.  Blood and urine cultures obtained at initial presentation were negative at time of discharge.  He was discharged with azithromycin and p.o. Cefdinir to complete a 3-day and 5-day course  respectively.  Hypotension Patient presented with low blood pressures with systolics in 70s.  Code sepsis was activated, and he received multiple boluses and IV fluids overnight.  His blood pressure improved following fluids with systolics in 100s and he was asymptomatic.  On chart review, his  baseline blood pressure is likely soft.  Discharge Vitals:   BP (!) 92/57 (BP Location: Left Arm)   Pulse 73   Temp 98.4 F (36.9 C) (Oral)   Resp 18   Ht 5\' 9"  (1.753 m)   Wt 165 lb (74.8 kg)   SpO2 98%   BMI 24.37 kg/m   Pertinent Labs, Studies, and Procedures:  CBC Latest Ref Rng & Units 03/12/2017 03/11/2017 02/05/2017  WBC 4.0 - 10.5 K/uL 7.9 15.4(H) 4.4  Hemoglobin 13.0 - 17.0 g/dL 11.9(L) 14.5 12.8(L)  Hematocrit 39.0 - 52.0 % 35.9(L) 42.2 38.1(L)  Platelets 150 - 400 K/uL 175 196 138(L)     Discharge Instructions: Discharge Instructions    Discharge instructions   Complete by:  As directed    It was a pleasure taking care you Johnny Conner We sent two antibiotics to your pharmacy to complete the antibiotics that were started here.  Take Azithromycin once tomorrow to complete its course.  Take Cefdinir twice a day for three days starting tomorrow You can continue to use over the counter robitussin or dayquil/nyquil to help your cough      Signed: Ginger CarneHarden, Mamoudou Mulvehill, MD 03/12/2017, 1:26 PM   Pager: [phone]

## 2017-03-12 NOTE — Progress Notes (Signed)
Discharge paperwork given to patient. Work note given.Patient is ready for discharge.

## 2017-03-12 NOTE — Progress Notes (Signed)
   Subjective: No acute events overnight, patient notes an episode of coughing this morning but overall improved compared to presentation.  Denies chills, nausea, vomiting.  Objective:  Vital signs in last 24 hours: Vitals:   03/11/17 1815 03/11/17 1845 03/11/17 2107 03/12/17 0547  BP: 99/75 100/64 98/63 (!) 92/57  Pulse: 70 71 76 73  Resp: 20 18 18 18   Temp:  98.2 F (36.8 C) 98.3 F (36.8 C) 98.4 F (36.9 C)  TempSrc:  Skin Oral Oral  SpO2: 96% 100% 100% 98%  Weight:  165 lb (74.8 kg)    Height:  5\' 9"  (1.753 m)     General: Resting in bed comfortably, no acute distress CV: RRR, s1, s2  Resp: Clear to auscultation bilaterally, normal work of breathing, no distress  Abd: Soft, +BS, non-tender Extr: No edema Neuro: Alert and oriented x3 Skin: Warm, dry      Assessment/Plan:  Community Acquired Pneumonia Patient presented with acute worsening of cough and associated chills, nausea and vomiting with leukocytosis to 15.4 and potential opacity in the right lung on chest x-ray.  He was started on ceftriaxone and azithromycin for presumed community acquired pneumonia with improvement in his cough and other symptoms.  Blood and urine cultures no growth to date. --Continue azithromycin to complete 3 day course --Transition ceftriaxone to PO cefdinir to complete 5 day course  Hypotension Patient presented with low blood pressures with systolics down to 70s.  Code sepsis was activated, lactic acid negative.  His blood pressure improved after receiving boluses in the ED and fluids overnight.  On chart review it appears his baseline blood pressure is likely soft.  He is currently asymptomatic with systolics in the low 100s.    Dispo: Anticipated discharge in approximately 0-1 day(s).   Ginger CarneHarden, Gryphon Vanderveen, MD 03/12/2017, 1:26 PM Pager: [phone]

## 2017-03-16 LAB — CULTURE, BLOOD (ROUTINE X 2)
Culture: NO GROWTH
Culture: NO GROWTH
SPECIAL REQUESTS: ADEQUATE
Special Requests: ADEQUATE

## 2017-03-30 ENCOUNTER — Telehealth: Payer: Self-pay | Admitting: *Deleted

## 2017-03-30 NOTE — Telephone Encounter (Signed)
[facility] 7 blue forms from [employer] to be filled out, stated he really needed them today for his employer, was informed that was more than likely not possible, have attached a slip w/ his name and ph# to the papers and will turn over to chilonb.

## 2017-04-14 ENCOUNTER — Telehealth: Payer: Self-pay | Admitting: Internal Medicine

## 2017-04-14 NOTE — Telephone Encounter (Signed)
Pt called to f/u with FMLA forms when he was admitted on 03/11/2017. Patient was provided a letter but, also needs his  FMLA form completed.Patient states he still needs his FMLA forms to be completed for the 03/11/2017 to 03/16/2017 and his HFU appt only.

## 2017-04-29 NOTE — Telephone Encounter (Signed)
I saw him in December of 2016 not 2018.

## 2017-04-29 NOTE — Telephone Encounter (Signed)
F/U per patient.  Patient has not been seen in the clinic by Dr. Anthonette LegatoHarden since his November Hospitalization but, states he was seen 04/03/2017 by you in the Acute Care Clinic and wanted to know if you could do his FMLA forms to cover just the dates in November if possible.

## 2017-04-29 NOTE — Telephone Encounter (Signed)
I was not involved in the hospitalization, HFU and am not listed as the PCP. Not sure why I am tagged on this conversation. Thanks.

## 2017-07-29 ENCOUNTER — Emergency Department (HOSPITAL_COMMUNITY)
Admission: EM | Admit: 2017-07-29 | Discharge: 2017-07-29 | Disposition: A | Discharge status: Home / Self Care | Payer: Managed Care, Other (non HMO) | Attending: Emergency Medicine | Admitting: Emergency Medicine

## 2017-07-29 ENCOUNTER — Encounter (HOSPITAL_COMMUNITY): Payer: Self-pay

## 2017-07-29 ENCOUNTER — Other Ambulatory Visit: Payer: Self-pay

## 2017-07-29 DIAGNOSIS — J111 Influenza due to unidentified influenza virus with other respiratory manifestations: Secondary | ICD-10-CM | POA: Diagnosis not present

## 2017-07-29 DIAGNOSIS — Z79899 Other long term (current) drug therapy: Secondary | ICD-10-CM | POA: Insufficient documentation

## 2017-07-29 DIAGNOSIS — R1110 Vomiting, unspecified: Secondary | ICD-10-CM | POA: Diagnosis present

## 2017-07-29 DIAGNOSIS — Z87891 Personal history of nicotine dependence: Secondary | ICD-10-CM | POA: Insufficient documentation

## 2017-07-29 DIAGNOSIS — R69 Illness, unspecified: Secondary | ICD-10-CM

## 2017-07-29 LAB — URINALYSIS, ROUTINE W REFLEX MICROSCOPIC
Bilirubin Urine: NEGATIVE
GLUCOSE, UA: NEGATIVE mg/dL
Ketones, ur: 5 mg/dL — AB
Leukocytes, UA: NEGATIVE
NITRITE: NEGATIVE
PH: 8 (ref 5.0–8.0)
Protein, ur: 30 mg/dL — AB
SPECIFIC GRAVITY, URINE: 1.029 (ref 1.005–1.030)

## 2017-07-29 LAB — COMPREHENSIVE METABOLIC PANEL
ALBUMIN: 4.3 g/dL (ref 3.5–5.0)
ALK PHOS: 75 U/L (ref 38–126)
ALT: 19 U/L (ref 17–63)
ANION GAP: 12 (ref 5–15)
AST: 22 U/L (ref 15–41)
BILIRUBIN TOTAL: 0.9 mg/dL (ref 0.3–1.2)
BUN: 12 mg/dL (ref 6–20)
CALCIUM: 9.5 mg/dL (ref 8.9–10.3)
CO2: 21 mmol/L — ABNORMAL LOW (ref 22–32)
Chloride: 103 mmol/L (ref 101–111)
Creatinine, Ser: 1.21 mg/dL (ref 0.61–1.24)
GFR calc Af Amer: >60 mL/min (ref >60)
GLUCOSE: 117 mg/dL — AB (ref 65–99)
Potassium: 3.5 mmol/L (ref 3.5–5.1)
Sodium: 136 mmol/L (ref 135–145)
TOTAL PROTEIN: 7 g/dL (ref 6.5–8.1)

## 2017-07-29 LAB — CBC
HCT: 41.4 % (ref 39.0–52.0)
Hemoglobin: 13.9 g/dL (ref 13.0–17.0)
MCH: 30.3 pg (ref 26.0–34.0)
MCHC: 33.6 g/dL (ref 30.0–36.0)
MCV: 90.4 fL (ref 78.0–100.0)
Platelets: 178 10*3/uL (ref 150–400)
RBC: 4.58 MIL/uL (ref 4.22–5.81)
RDW: 12.2 % (ref 11.5–15.5)
WBC: 6.4 10*3/uL (ref 4.0–10.5)

## 2017-07-29 LAB — LIPASE, BLOOD: Lipase: 25 U/L (ref 11–51)

## 2017-07-29 MED ORDER — MORPHINE SULFATE (PF) 4 MG/ML IV SOLN
4.0000 mg | Freq: Once | INTRAVENOUS | Status: AC
  Administered 2017-07-29: 4 mg via INTRAVENOUS
  Filled 2017-07-29: qty 1

## 2017-07-29 MED ORDER — BENZONATATE 100 MG PO CAPS: 200.0000 mg | ORAL_CAPSULE | Freq: Two times a day (BID) | ORAL | 0 refills | Status: DC | PRN

## 2017-07-29 MED ORDER — ACETAMINOPHEN 500 MG PO TABS
1000.0000 mg | ORAL_TABLET | Freq: Once | ORAL | Status: AC
  Administered 2017-07-29: 1000 mg via ORAL
  Filled 2017-07-29: qty 2

## 2017-07-29 MED ORDER — SODIUM CHLORIDE 0.9 % IV BOLUS
1000.0000 mL | Freq: Once | INTRAVENOUS | Status: AC
  Administered 2017-07-29: 1000 mL via INTRAVENOUS

## 2017-07-29 MED ORDER — OSELTAMIVIR PHOSPHATE 75 MG PO CAPS: 75.0000 mg | ORAL_CAPSULE | Freq: Two times a day (BID) | ORAL | 0 refills | Status: DC

## 2017-07-29 MED ORDER — ONDANSETRON 4 MG PO TBDP: 4.0000 mg | ORAL_TABLET | Freq: Three times a day (TID) | ORAL | 0 refills | Status: DC | PRN

## 2017-07-29 MED ORDER — ONDANSETRON HCL 4 MG/2ML IJ SOLN
4.0000 mg | Freq: Once | INTRAMUSCULAR | Status: AC
  Administered 2017-07-29: 4 mg via INTRAVENOUS
  Filled 2017-07-29: qty 2

## 2017-07-29 NOTE — ED Provider Notes (Signed)
[facility] EMERGENCY DEPARTMENT Provider Note   CSN: [id number] Arrival date & time: 07/29/17  0225     History   Chief Complaint Chief Complaint  Patient presents with  . Emesis    HPI Johnny Conner is a 22 y.o. male.  Patient presents to the emergency department with a chief complaint of vomiting.  He also reports associated fever, chills, cough, sore throat, and body aches.  He states the symptoms started yesterday.  He did not get a flu shot.  He has not taken anything for symptoms.  Denies known sick contacts.  The history is provided by the patient. No language interpreter was used.    Past Medical History:  Diagnosis Date  . CAP (community acquired pneumonia) 03/11/2017  . GERD (gastroesophageal reflux disease)   . 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 Pneumonia ~ 1999  . Pyelonephritis 2012   treated with 3 days of unspecified abx    Patient Active Problem List   Diagnosis Date Noted  . CAP (community acquired pneumonia) 03/11/2017  . Abdominal pain 02/04/2017  . 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 Nausea and vomiting 12/14/2014    Past Surgical History:  Procedure Laterality Date  . NO PAST SURGERIES          Home Medications    Prior to Admission medications   Medication Sig Start Date End Date Taking? Authorizing Provider  DM-Doxylamine-Acetaminophen (NYQUIL COLD & FLU PO) Take 1 capsule at bedtime as needed by mouth (sleep, pain).   Yes [provider]  Pseudoephedrine-APAP-DM (DAYQUIL MULTI-SYMPTOM COLD/FLU PO) Take 1 capsule daily as needed by mouth (cold sx).   Yes [provider]  TURMERIC PO Take 1 tablet by mouth daily.   Yes [provider]    Family History Family History  Problem Relation Age of Onset  . Skin cancer Paternal Grandfather     Social History Social History   Tobacco Use  . Smoking status: Former Smoker    Packs/day: 0.10   Years: 4.00    Pack years: 0.40    Types: Cigarettes    Last attempt to quit: 03/28/2016    Years since quitting: 1.3  . Smokeless tobacco: Former User    Types: Snuff, Chew    Quit date: 06/14/2011  Substance Use Topics  . Alcohol use: Yes    Comment: 03/11/2017 "nothing since September"  . Drug use: No     Allergies   Patient has no known allergies.   Review of Systems Review of Systems  All other systems reviewed and are negative.    Physical Exam Updated Vital Signs BP (!) 107/59   Pulse 97   Temp (!) 100.5 F (38.1 C) (Oral)   Resp (!) 25   Ht 5\' 9"  (1.753 m)   Wt 73.5 kg (162 lb)   SpO2 95%   BMI 23.92 kg/m   Physical Exam  Constitutional: He is oriented to person, place, and time. He appears well-developed and well-nourished.  HENT:  Head: Normocephalic and atraumatic.  Eyes: Pupils are equal, round, and reactive to light. Conjunctivae and EOM are normal. Right eye exhibits no discharge. Left eye exhibits no discharge. No scleral icterus.  Neck: Normal range of motion. Neck supple. No JVD present.  Cardiovascular: Normal rate, regular rhythm and normal heart sounds. Exam reveals no gallop and no friction rub.  No murmur heard. Pulmonary/Chest: Effort normal and breath sounds normal. No  respiratory distress. He has no wheezes. He has no rales. He exhibits no tenderness.  Abdominal: Soft. He exhibits no distension and no mass. There is no tenderness. There is no rebound and no guarding.  Musculoskeletal: Normal range of motion. He exhibits no edema or tenderness.  Neurological: He is alert and oriented to person, place, and time.  Skin: Skin is warm and dry.  Psychiatric: He has a normal mood and affect. His behavior is normal. Judgment and thought content normal.  Nursing note and vitals reviewed.    ED Treatments / Results  Labs (all labs ordered are listed, but only abnormal results are displayed) Labs Reviewed  COMPREHENSIVE METABOLIC PANEL -  Abnormal; Notable for the following components:      Result Value   CO2 21 (*)    Glucose, Bld 117 (*)    All other components within normal limits  URINALYSIS, ROUTINE W REFLEX MICROSCOPIC - Abnormal; Notable for the following components:   Hgb urine dipstick MODERATE (*)    Ketones, ur 5 (*)    Protein, ur 30 (*)    Bacteria, UA RARE (*)    Squamous Epithelial / LPF 0-5 (*)    All other components within normal limits  LIPASE, BLOOD  CBC    EKG None  Radiology No results found.  Procedures Procedures (including critical care time)  Medications Ordered in ED Medications  sodium chloride 0.9 % bolus 1,000 mL (1,000 mLs Intravenous New Bag/Given 07/29/17 0418)  acetaminophen (TYLENOL) tablet 1,000 mg (1,000 mg Oral Given 07/29/17 0412)  morphine 4 MG/ML injection 4 mg (4 mg Intravenous Given 07/29/17 0420)  ondansetron (ZOFRAN) injection 4 mg (4 mg Intravenous Given 07/29/17 0420)     Initial Impression / Assessment and Plan / ED Course  I have reviewed the triage vital signs and the nursing notes.  Pertinent labs & imaging results that were available during my care of the patient were reviewed by me and considered in my medical decision making (see chart for details).     Patient with symptoms consistent with viral process and likely influenza.  Vitals are stable, low-grade fever.  No signs of dehydration, tolerating PO's.  Lungs are clear. Due to patient's presentation and physical exam a chest x-ray was not ordered bc likely diagnosis of flu.  Will try tamiflu.  Patient will be discharged with instructions to orally hydrate, rest, and use over-the-counter medications such as anti-inflammatories ibuprofen and Aleve for muscle aches and Tylenol for fever.  Patient will also be given a cough suppressant.   Tolerating fluids. Feels improved.  Final Clinical Impressions(s) / ED Diagnoses   Final diagnoses:  Influenza-like illness    ED Discharge Orders        Ordered     oseltamivir (TAMIFLU) 75 MG capsule  Every 12 hours     07/29/17 0610    benzonatate (TESSALON) 100 MG capsule  2 times daily PRN     07/29/17 0610    ondansetron (ZOFRAN ODT) 4 MG disintegrating tablet  Every 8 hours PRN     07/29/17 0610       Roxy HorsemanBrowning, Raynesha Tiedt, PA-C 07/29/17 0612    Glynn Octaveancour, Stephen, MD 07/29/17 [phone]

## 2017-07-29 NOTE — ED Notes (Signed)
ED Provider at bedside. 

## 2017-07-29 NOTE — ED Triage Notes (Signed)
Pt reports general body aches, dehydration, and vomiting x1 day. Pt reports 4 episodes of vomiting in the past 24 hours. Pt rates generalized body pain 6/10.

## 2018-10-22 ENCOUNTER — Emergency Department (INDEPENDENT_AMBULATORY_CARE_PROVIDER_SITE_OTHER): Payer: Managed Care, Other (non HMO)

## 2018-10-22 ENCOUNTER — Emergency Department (INDEPENDENT_AMBULATORY_CARE_PROVIDER_SITE_OTHER)
Admission: EM | Admit: 2018-10-22 | Discharge: 2018-10-22 | Disposition: A | Discharge status: Home / Self Care | Payer: Managed Care, Other (non HMO) | Source: Home / Self Care

## 2018-10-22 ENCOUNTER — Other Ambulatory Visit: Payer: Self-pay

## 2018-10-22 DIAGNOSIS — M545 Low back pain, unspecified: Secondary | ICD-10-CM

## 2018-10-22 IMAGING — DX LUMBAR SPINE - COMPLETE 4+ VIEW
5 series · 5 of 5 positions shown · non-contrast
Comparison: None.

CLINICAL DATA: Low back pain for 2 weeks.

EXAM:
LUMBAR SPINE - COMPLETE 4+ VIEW

[l-spine ap]
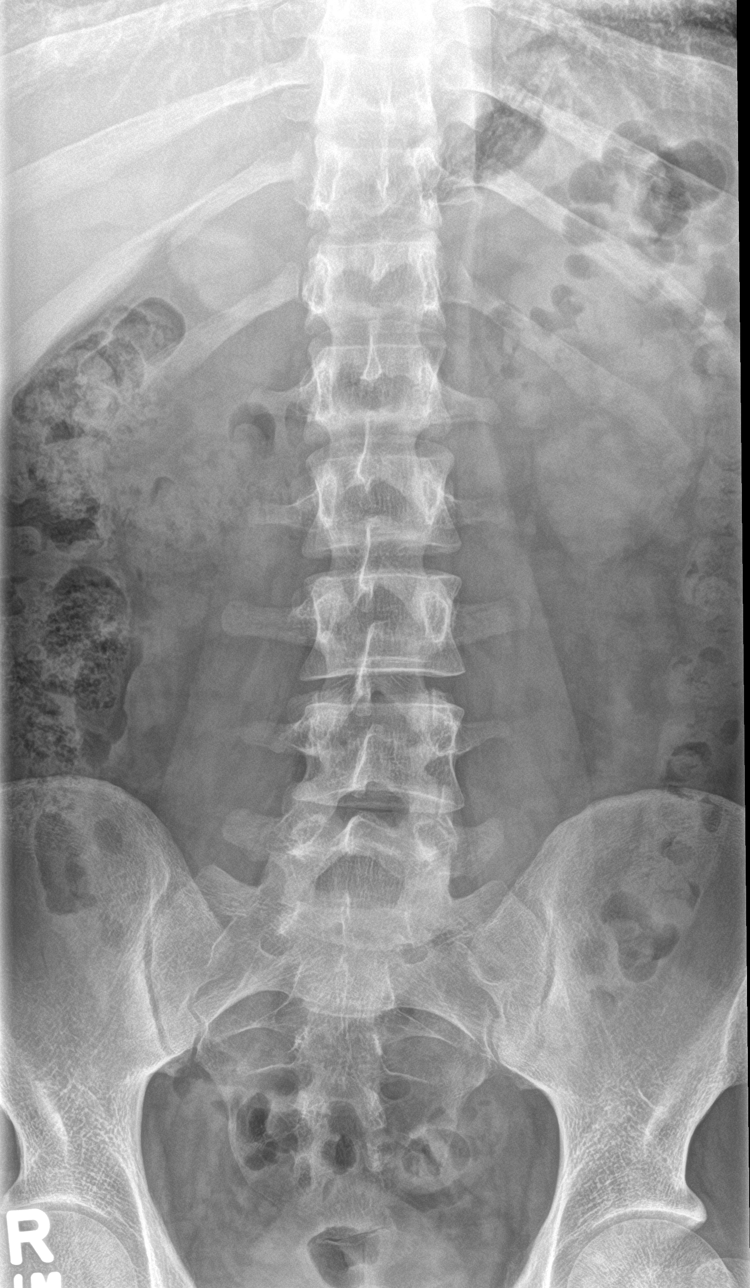

[l-spine obl (1 of 2)]
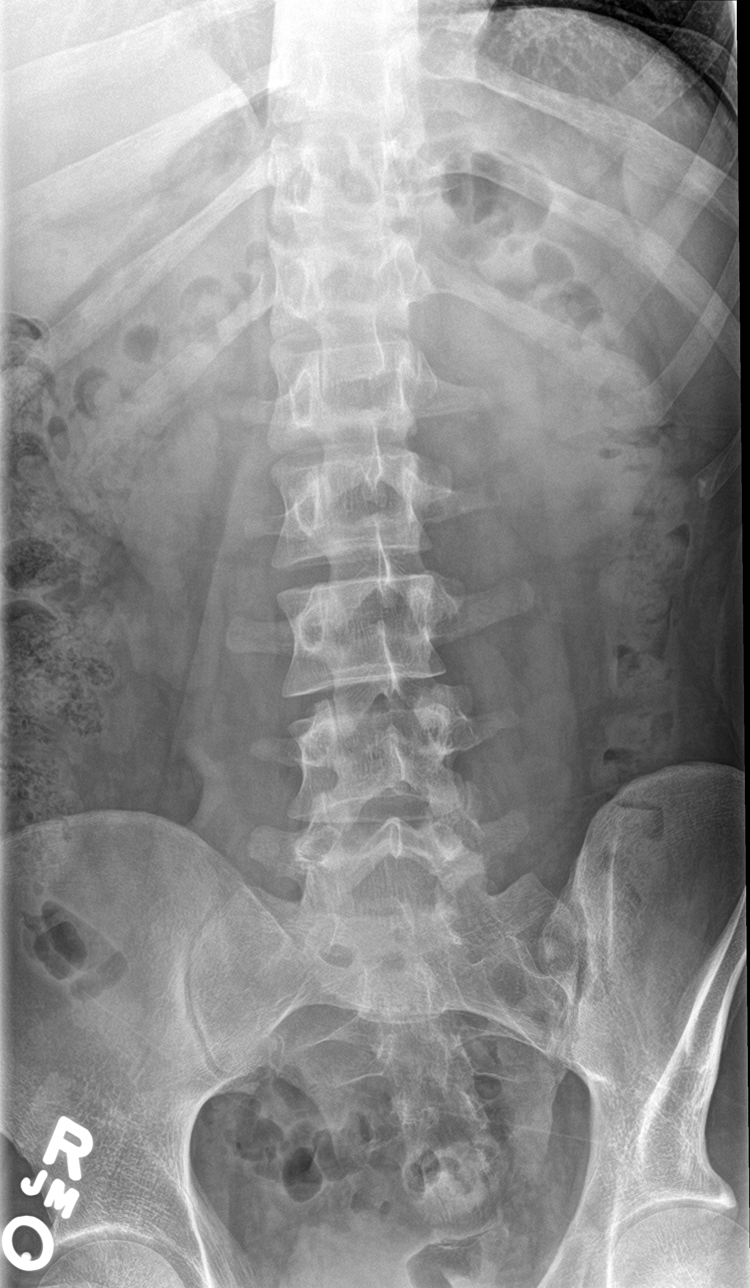

[l-spine obl (2 of 2)]
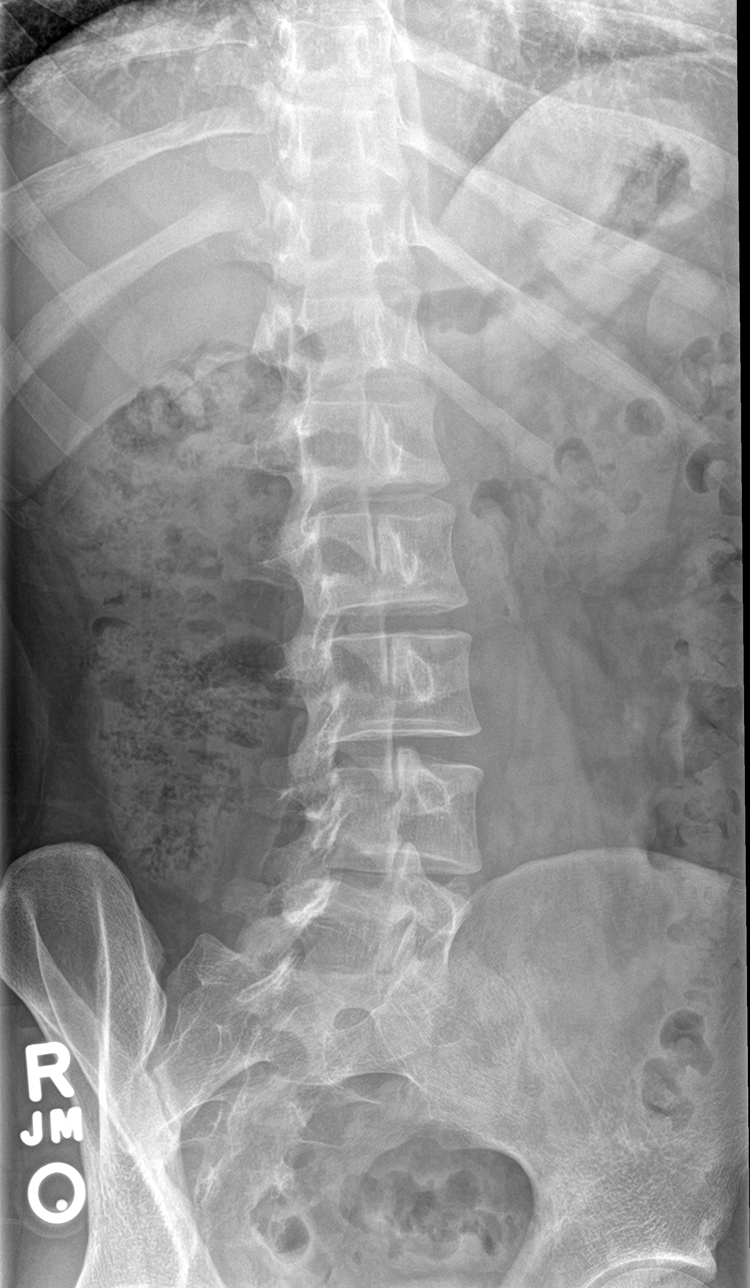

[l-spine lat]
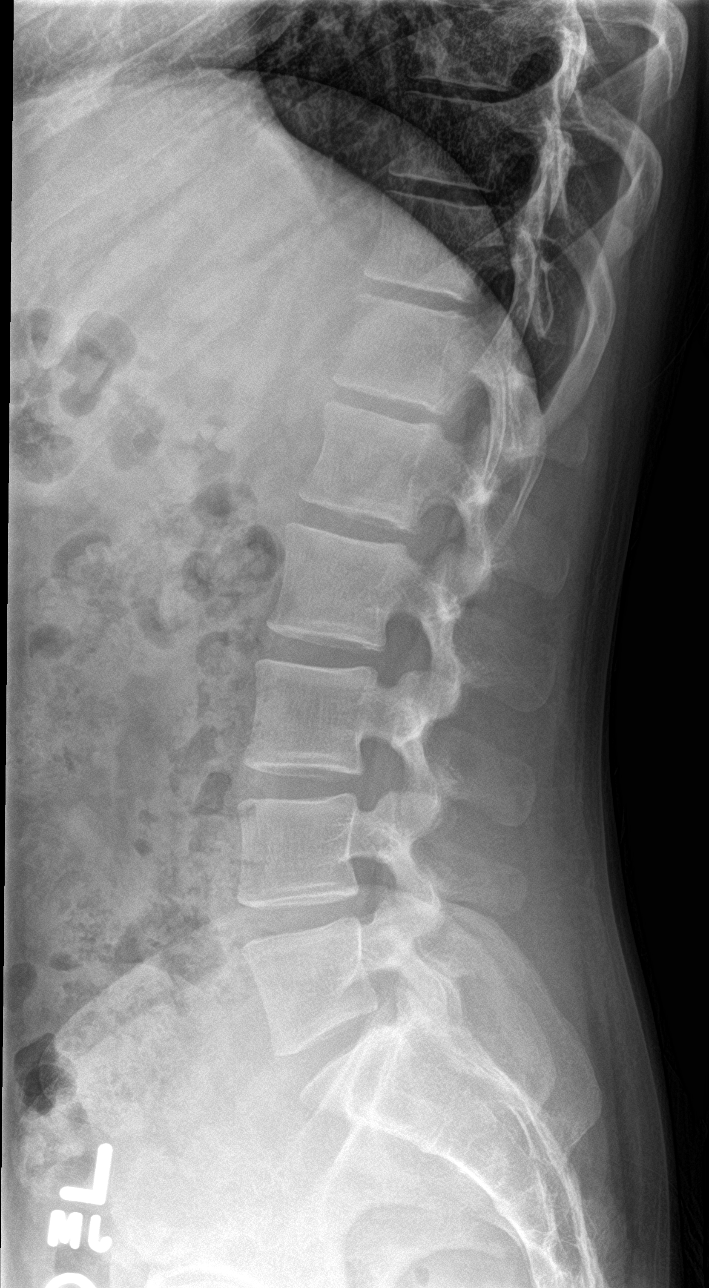

[l-spine spot]
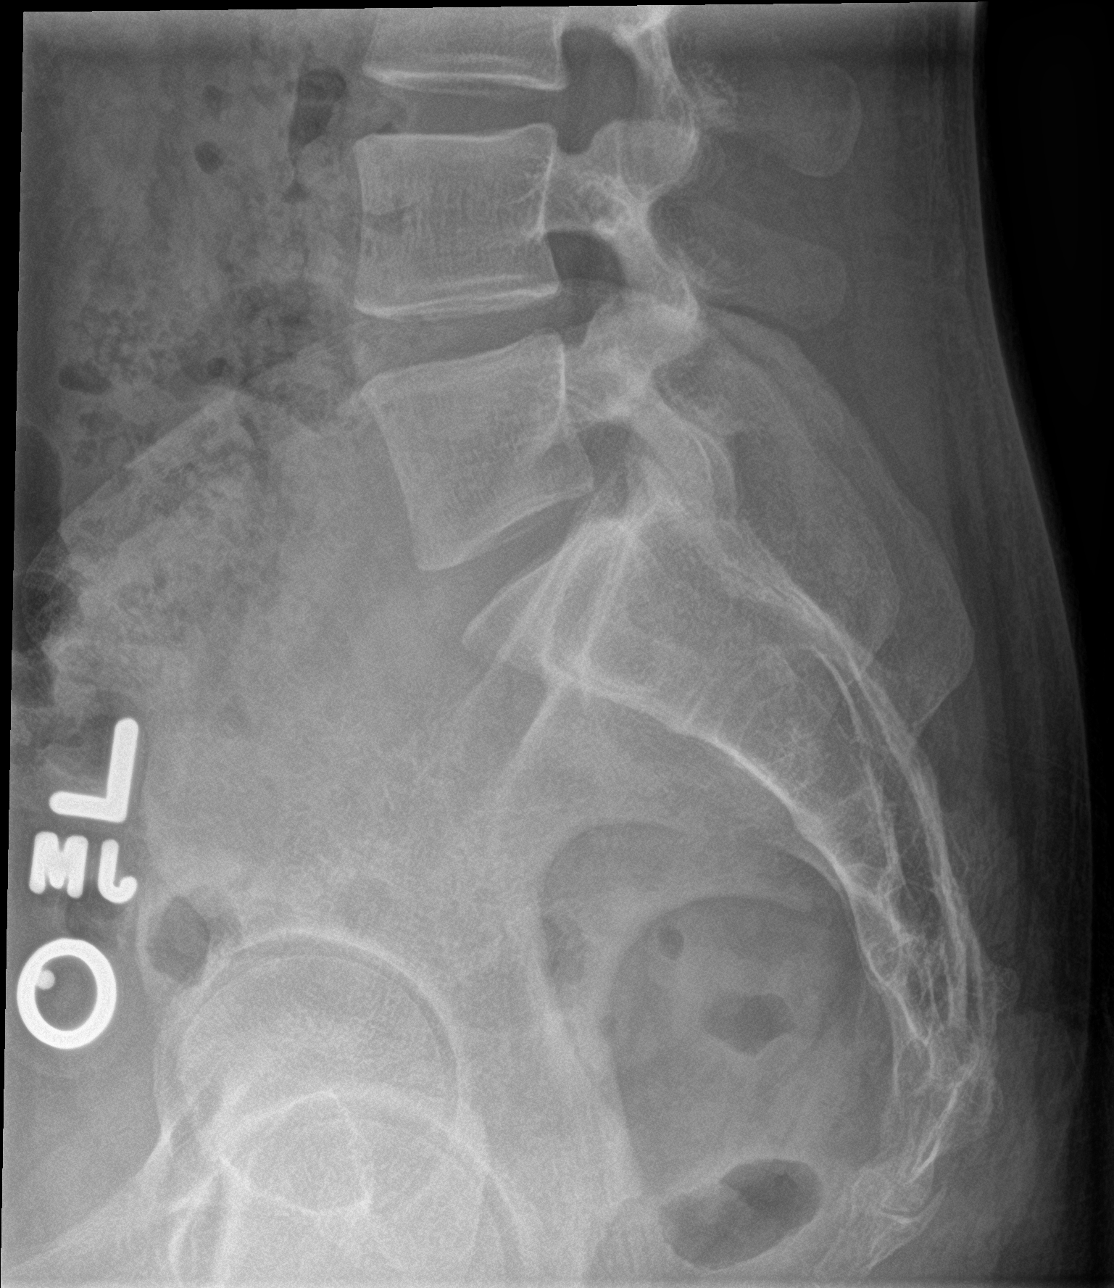

[5 of 5 positions shown; findings below may reference images not displayed]

FINDINGS: Normal alignment of the lumbar vertebral bodies. Disc spaces and
vertebral bodies are maintained. The facets are normally aligned. No
pars defects. The visualized bony pelvis is intact.
IMPRESSION: Normal alignment and no acute bony findings.

## 2018-10-22 MED ORDER — MELOXICAM 15 MG PO TABS: 15.0000 mg | ORAL_TABLET | Freq: Every day | ORAL | 0 refills | Status: DC

## 2018-10-22 NOTE — ED Triage Notes (Signed)
Pain in lower back for the past 2 weeks.  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  Back has hurt so bad that he has left work early.  Sitting to drive his car causes pain.  He also has tenderness in the upper abd, lower chest.  Pt denies injury

## 2018-10-22 NOTE — ED Provider Notes (Signed)
[facility]    CSN: [id number] Arrival date & time: 10/22/18  1213     History   Chief Complaint Chief Complaint  Patient presents with   Back Pain   Abdominal Pain    HPI Johnny Conner is a 23 y.o. male.   HPI Johnny Conner is a 23 y.o. male presenting to UC with c/o 2 weeks of intermittent low back pain and upper abdomen/lower rib/chest pain.  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 He feels like the vibration of his work vehicles has exacerbated his pain.  He did take Tylenol last night with moderate relief but he has had to leave work early a few times this week due to the pain.  Denies radiation of pain or numbness in arms or legs.  No specific known injury. Pain is currently 4/10. Denies urinary symptoms. No prior hx of back problems.   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 Authorizing Provider  benzonatate (TESSALON) 100 MG capsule Take 2 capsules (200 mg total) by mouth 2 (two) times daily as needed for cough. 07/29/17   Roxy HorsemanBrowning, Robert, PA-C  DM-Doxylamine-Acetaminophen (NYQUIL COLD & FLU PO) Take 1 capsule at bedtime as needed by mouth (sleep, pain).    [provider]  meloxicam (MOBIC) 15 MG tablet Take  1 tablet (15 mg total) by mouth daily. As needed for pain 10/22/18   Waylan RocherPhelps, Johnny Crepeau O, PA-C  ondansetron (ZOFRAN ODT) 4 MG disintegrating tablet Take 1 tablet (4 mg total) by mouth every 8 (eight) hours as needed for nausea or vomiting. 07/29/17   Roxy HorsemanBrowning, Robert, PA-C  oseltamivir (TAMIFLU) 75 MG capsule Take 1 capsule (75 mg total) by mouth every 12 (twelve) hours. 07/29/17   Roxy HorsemanBrowning, Robert, PA-C  Pseudoephedrine-APAP-DM (DAYQUIL MULTI-SYMPTOM COLD/FLU PO) Take 1 capsule daily as needed by mouth (cold sx).    [provider]  TURMERIC PO Take 1 tablet by mouth daily.    [provider]    Family History Family History  Problem Relation Age of Onset   Skin cancer Paternal Grandfather     Social History Social History   Tobacco Use   Smoking status: Former Smoker    Packs/day: 0.10    Years: 4.00    Pack years: 0.40    Types: Cigarettes    Quit date: 03/28/2016    Years since quitting: 2.5   Smokeless tobacco: Former NeurosurgeonUser    Types: Snuff, Chew    Quit date: 06/14/2011  Substance Use Topics   Alcohol use: Yes    Comment: 03/11/2017 "nothing since September"   Drug use: No     Allergies   Patient  has no known allergies.   Review of Systems Review of Systems  Respiratory: Negative for chest tightness and shortness of breath.   Cardiovascular: Positive for chest pain (lower ribs/upper abdomen).  Gastrointestinal: Negative for nausea and vomiting.  Genitourinary: Negative for dysuria, flank pain and frequency.  Musculoskeletal: Positive for back pain and myalgias. Negative for arthralgias, gait problem and neck pain.  Skin: Negative for color change.  Neurological: Negative for weakness and numbness.     Physical Exam Triage Vital Signs ED Triage Vitals  Enc Vitals Group     BP      Pulse      Resp      Temp      Temp src      SpO2      Weight      Height      Head Circumference      Peak Flow      Pain Score      Pain Loc      Pain Edu?        Excl. in [place]?    No data found.  Updated Vital Signs BP 114/79 (BP Location: Right Arm)    Pulse 78    Temp 97.7 F (36.5 C) (Oral)    Resp 20    Ht 5\' 9"  (1.753 m)    Wt 152 lb (68.9 kg)    SpO2 100%    BMI 22.45 kg/m   Visual Acuity Right Eye Distance:   Left Eye Distance:   Bilateral Distance:    Right Eye Near:   Left Eye Near:    Bilateral Near:     Physical Exam Vitals signs and nursing note reviewed.  Constitutional:      Appearance: He is well-developed.  HENT:     Head: Normocephalic and atraumatic.  Neck:     Musculoskeletal: Normal range of motion.  Cardiovascular:     Rate and Rhythm: Normal rate and regular rhythm.  Pulmonary:     Effort: Pulmonary effort is normal.     Breath sounds: Normal breath sounds.  Chest:     Chest wall: Tenderness present.       Comments: Mild chest/upper abdominal tenderness. No deformity or crepitus.  Lungs: CTAB. No respiratory distress.  Abdominal:     General: There is no distension.     Palpations: Abdomen is soft.     Tenderness: There is no abdominal tenderness. There is no right CVA tenderness or left CVA tenderness.  Musculoskeletal: Normal range of motion.     Lumbar back: He exhibits tenderness.       Back:     Comments: Full ROM upper and lower extremities. Normal gait.   Skin:    General: Skin is warm and dry.  Neurological:     Mental Status: He is alert and oriented to person, place, and time.  Psychiatric:        Behavior: Behavior normal.      UC Treatments / Results  Labs (all labs ordered are listed, but only abnormal results are displayed) Labs Reviewed - No data to display  EKG None  Radiology Dg Lumbar Spine Complete  Result Date: 10/22/2018 CLINICAL DATA:  Low back pain for 2 weeks. EXAM: LUMBAR SPINE - COMPLETE 4+ VIEW COMPARISON:  None. FINDINGS: Normal alignment of the lumbar vertebral bodies. Disc spaces and vertebral bodies are maintained. The facets are normally aligned. No  pars defects. The visualized bony pelvis is intact. IMPRESSION: Normal alignment and  no acute bony findings. Electronically Signed   By: Rudie MeyerP.  Gallerani M.D.   On: 10/22/2018 13:23    Procedures Procedures (including critical care time)  Medications Ordered in UC Medications - No data to display  Initial Impression / Assessment and Plan / UC Course  I have reviewed the triage vital signs and the nursing notes.  Pertinent labs & imaging results that were available during my care of the patient were reviewed by me and considered in my medical decision making (see chart for details).     Reviewed imaging with pt No red flag symptoms Encouraged conservative tx F/u with PCP or sports medicine in 1-2 weeks if not improving. AVS provided.  Final Clinical Impressions(s) / UC Diagnoses   Final diagnoses:  Acute midline low back pain without sciatica     Discharge Instructions      Meloxicam (Mobic) is an antiinflammatory to help with pain and inflammation.  Do not take ibuprofen, Advil, Aleve, or any other medications that contain NSAIDs while taking meloxicam as this may cause stomach upset or even ulcers if taken in large amounts for an extended period of time.   You may also take 500mg  acetaminophen (Tylenol) every 4-6 hours (up to 1000mg ) for the first dose of the day.  You may also alternate cool and warm compresses to help with pain.  Please call to schedule a follow up appointment with Sports Medicine in 1-2 weeks if not improving.     ED Prescriptions    Medication Sig Dispense Auth. Provider   meloxicam (MOBIC) 15 MG tablet Take 1 tablet (15 mg total) by mouth daily. As needed for pain 15 tablet Lurene ShadowPhelps, Laityn Bensen O, PA-C     Controlled Substance Prescriptions [place] Controlled Substance Registry consulted? Not Applicable   Rolla Platehelps, Christo Hain O, PA-C 10/22/18 1759

## 2018-10-22 NOTE — Discharge Instructions (Signed)
°  Meloxicam (Mobic) is an antiinflammatory to help with pain and inflammation.  Do not take ibuprofen, Advil, Aleve, or any other medications that contain NSAIDs while taking meloxicam as this may cause stomach upset or even ulcers if taken in large amounts for an extended period of time.   You may also take 500mg  acetaminophen (Tylenol) every 4-6 hours (up to 1000mg ) for the first dose of the day.  You may also alternate cool and warm compresses to help with pain.  Please call to schedule a follow up appointment with Sports Medicine in 1-2 weeks if not improving.

## 2018-11-04 ENCOUNTER — Other Ambulatory Visit: Payer: Self-pay

## 2018-11-04 ENCOUNTER — Ambulatory Visit (INDEPENDENT_AMBULATORY_CARE_PROVIDER_SITE_OTHER): Payer: Managed Care, Other (non HMO) | Admitting: Physician Assistant

## 2018-11-04 ENCOUNTER — Encounter: Payer: Self-pay | Admitting: Physician Assistant

## 2018-11-04 VITALS — BP 110/75 | HR 70 | Temp 98.0°F | Ht 69.0 in | Wt 153.0 lb

## 2018-11-04 DIAGNOSIS — Z7689 Persons encountering health services in other specified circumstances: Secondary | ICD-10-CM | POA: Diagnosis not present

## 2018-11-04 DIAGNOSIS — M7989 Other specified soft tissue disorders: Secondary | ICD-10-CM

## 2018-11-04 DIAGNOSIS — M2550 Pain in unspecified joint: Secondary | ICD-10-CM | POA: Insufficient documentation

## 2018-11-04 DIAGNOSIS — M545 Low back pain, unspecified: Secondary | ICD-10-CM

## 2018-11-04 DIAGNOSIS — Z13220 Encounter for screening for lipoid disorders: Secondary | ICD-10-CM

## 2018-11-04 DIAGNOSIS — R790 Abnormal level of blood mineral: Secondary | ICD-10-CM

## 2018-11-04 DIAGNOSIS — M25472 Effusion, left ankle: Secondary | ICD-10-CM

## 2018-11-04 DIAGNOSIS — M542 Cervicalgia: Secondary | ICD-10-CM

## 2018-11-04 DIAGNOSIS — Z8269 Family history of other diseases of the musculoskeletal system and connective tissue: Secondary | ICD-10-CM | POA: Diagnosis not present

## 2018-11-04 DIAGNOSIS — Z87441 Personal history of nephrotic syndrome: Secondary | ICD-10-CM

## 2018-11-04 MED ORDER — CYCLOBENZAPRINE HCL 5 MG PO TABS: 5.0000 mg | ORAL_TABLET | Freq: Every evening | ORAL | 1 refills | Status: DC | PRN

## 2018-11-04 NOTE — Patient Instructions (Signed)
Joint Pain Joint pain (arthralgia) may be caused by many things. Joint pain is likely to go away when you follow instructions from your health care provider for relieving pain at home. However, joint pain can also be caused by conditions that require more treatment. Common causes of joint pain include:  Bruising in the area of the joint.  Injury caused by repeating certain movements too many times (overuse injury).  Age-related joint wear and tear (osteoarthritis).  Buildup of uric acid crystals in the joint (gout).  Inflammation of the joint (rheumatic disease).  Various other forms of arthritis.  Infections of the joint (septic arthritis) or of the bone (osteomyelitis). Your health care provider may recommend that you take pain medicine or wear a supportive device like an elastic bandage, sling, or splint. If your joint pain continues, you may need lab or imaging tests to diagnose the cause of your joint pain. Follow these instructions at home: Managing pain, stiffness, and swelling   If directed, put ice on the painful area. Icing can help to relieve joint pain and swelling. ? Put ice in a plastic bag. ? Place a towel between your skin and the bag. ? Leave the ice on for 20 minutes, 2-3 times a day.  If directed, apply heat to the painful area as often as told by your health care provider. Heat can reduce the stiffness of your muscles and joints. Use the heat source that your health care provider recommends, such as a moist heat pack or a heating pad. ? Place a towel between your skin and the heat source. ? Leave the heat on for 20-30 minutes. ? Remove the heat if your skin turns bright red. This is especially important if you are unable to feel pain, heat, or cold. You may have a greater risk of getting burned.  Move your fingers or toes below the painful joint often. You can avoid stiffness and lessen swelling by doing this.  If possible, raise (elevate) the painful joint above  the level of your heart while you are sitting or lying down. To do this, try putting a few pillows under the painful joint. Activity  Rest the painful joint for as long as directed. Do not do anything that causes or worsens pain.  Begin exercising or stretching the affected area, as told by your health care provider. Ask your health care provider what types of exercise are safe for you. If you have an elastic bandage, sling, or splint:  Wear the supportive device as told by your health care provider. Remove it only as told by your health care provider.  Loosen the device if your fingers or toes below the joint tingle, become numb, or turn cold and blue.  Keep the device clean.  Ask your health care provider if you should remove the device before bathing. You may need to cover it with a watertight covering when you take a bath or a shower. General instructions  Take over-the-counter and prescription medicines only as told by your health care provider.  Do not use any products that contain nicotine or tobacco, such as cigarettes and e-cigarettes. If you need help quitting, ask your health care provider.  Keep all follow-up visits as told by your health care provider. This is important. Contact a health care provider if:  You have pain that gets worse and does not get better with medicine.  Your joint pain does not improve within 3 days.  You have increased bruising or swelling.  You have a fever.  You lose 10 lb (4.5 kg) or more without trying. Get help right away if:  You cannot move the joint.  Your fingers or toes tingle, become numb, or turn cold and blue.  You have a fever along with a joint that is red, warm, and swollen. Summary  Joint pain (arthralgia) may be caused by many things.  Your health care provider may recommend that you take pain medicine or wear a supportive device like an elastic bandage, sling, or splint.  If your joint pain continues, you may need  tests to diagnose the cause of your joint pain.  Take over-the-counter and prescription medicines only as told by your health care provider. This information is not intended to replace advice given to you by your health care provider. Make sure you discuss any questions you have with your health care provider. Document Released: 04/14/2005 Document Revised: 03/27/2017 Document Reviewed: 01/28/2017 Elsevier Patient Education  2020 [employer].

## 2018-11-04 NOTE — Progress Notes (Signed)
HPI:                                                                Johnny Conner is a 23 y.o. 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  He reports that his left ankle and his left hand will occasionally swell up.  He reports that his low back feels stiff when he gets up in the morning.  States it is hard to get comfortable and he does not feel rested. He also notes that he fatigues and becomes stiffer throughout the day.  He states he will been walking normally throughout the morning and by the end of the day he is limping. Denies saddle numbness, bowel or bladder dysfunction, lower extremity weakness, radicular symptoms. 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 He was seen in urgent care for his back pain and had a negative lumbar spine X-ray on 10/22/18 Reports Meloxicam is mildly helpful for his back pain. Reports family history of ankylosing spondylitis in his brother and he would like to have the lab test for HLA-B27 antigen  Depression screen [facility] 2/9 11/04/2018 01/02/2015 12/25/2014  Decreased Interest 0 0 0  Down, Depressed, Hopeless 0 0 -  PHQ - 2 Score 0 0 0  Altered sleeping 3 - -  Tired, decreased energy 2 - -  Change in appetite 0 - -  Feeling bad or failure about yourself  0 - -  Trouble concentrating 0 - -  Moving slowly or fidgety/restless 0 - -  Suicidal thoughts 0 - -  PHQ-9 Score 5 - -    No flowsheet data found.    Past Medical History:  Diagnosis Date  . Acute IgA nephropathy   . Acute kidney injury (AKI) with acute tubular necrosis (ATN) (HCC)   . CAP (community acquired pneumonia) 03/11/2017  . Cyclic citrullinated peptide (CCP) antibody positive  11/08/2018   weakly positive level, RF negative, ESR/CRP normal,   . GERD (gastroesophageal reflux disease)   . 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 Pneumonia ~ 1999  . Pyelonephritis 2012   treated with 3 days of unspecified abx   Past Surgical History:  Procedure Laterality Date  . NO PAST SURGERIES     Social History   Tobacco Use  . Smoking status: Former Smoker    Packs/day: 0.10    Years: 5.00    Pack years: 0.50    Types: Cigarettes    Quit date: 03/28/2016    Years since quitting: 2.6  . Smokeless tobacco: Former User    Types: Snuff, Chew    Quit date: 06/14/2011  Substance Use Topics  . Alcohol use: Yes    Alcohol/week: 1.0 standard drinks    Types: 1 Standard drinks or equivalent per week   family history includes Ankylosing spondylitis in his brother; Arrhythmia in his father; Arthritis in his paternal grandfather; Glaucoma in his paternal grandfather;  Lupus in an other family member; Skin cancer in his paternal grandfather; Thyroid disease in his paternal grandmother.    Review of Systems  Constitutional: Positive for fatigue.  Musculoskeletal: Positive for arthralgias (shoulders, knees, ankles), back pain (improving), gait problem, joint swelling (L hand, L ankle) and neck stiffness.  Neurological: Negative for weakness and numbness.  All other systems reviewed and are negative.    Medications: Current Outpatient Medications  Medication Sig Dispense Refill  . ibuprofen (ADVIL) 600 MG tablet Take 600 mg by mouth every 6 (six) hours as needed.    . cyclobenzaprine (FLEXERIL) 5 MG tablet Take 1-2 tablets (5-10 mg total) by mouth at bedtime as needed for muscle spasms (muscle pain). 30 tablet 1   No current facility-administered medications for this visit.    No Known Allergies     Objective:  BP 110/75   Pulse 70   Temp 98 F (36.7 C) (Oral)   Ht '5\' 9"'  (1.753 m)   Wt  153 lb (69.4 kg)   SpO2 98%   BMI 22.59 kg/m  Gen:  alert, not ill-appearing, no distress, appropriate for age 42: head normocephalic without obvious abnormality, conjunctiva and cornea clear, trachea midline Pulm: Normal work of breathing, normal phonation, clear to auscultation bilaterally, no wheezes, rales or rhonchi CV: Normal rate, regular rhythm, s1 and s2 distinct, no murmurs, clicks or rubs  Neuro: alert and oriented x 3, no tremor Back: atraumatic, no midline tenderness, no SI joint tenderness, negative SLR MSK: extremities atraumatic, normal gait and station Skin: intact, no rashes on exposed skin, no jaundice, no cyanosis Psych: well-groomed, cooperative, good eye contact, euthymic mood, affect mood-congruent, speech is articulate, and thought processes clear and goal-directed  Lab Results  Component Value Date   CREATININE 1.13 11/04/2018   BUN 13 11/04/2018   NA 140 11/04/2018   K 4.1 11/04/2018   CL 104 11/04/2018   CO2 27 11/04/2018   Lab Results  Component Value Date   ALT 28 11/04/2018   AST 21 11/04/2018   ALKPHOS 75 07/29/2017   BILITOT 0.4 11/04/2018   Lab Results  Component Value Date   WBC 8.7 11/04/2018   HGB 14.8 11/04/2018   HCT 44.0 11/04/2018   MCV 91.7 11/04/2018   PLT 235 11/04/2018   Lab Results  Component Value Date   ANA NEGATIVE 11/04/2018   RF <14 11/04/2018    Study Result  CLINICAL DATA:  Low back pain for 2 weeks.  EXAM: LUMBAR SPINE - COMPLETE 4+ VIEW  COMPARISON:  None.  FINDINGS: Normal alignment of the lumbar vertebral bodies. Disc spaces and vertebral bodies are maintained. The facets are normally aligned. No pars defects. The visualized bony pelvis is intact.  IMPRESSION: Normal alignment and no acute bony findings.   Electronically Signed   By: Marijo Sanes M.D.   On: 10/22/2018 13:23     No results found for this or any previous visit (from the past 72 hour(s)). No results  found.    Assessment and Plan: 23 y.o. male with   .Adian was seen today for establish care.  Diagnoses and all orders for this visit:  Encounter to establish care  Family history of ankylosing spondylitis -     HLA-B27 antigen  History of primary IgA nephropathy -     Renal Profile with Estimated GFR -     Uric acid -     CK -     HLA-B27 antigen -  Sedimentation rate -     C-reactive protein -     CBC with Differential/Platelet -     Hepatic function panel -     Urinalysis, Routine w reflex microscopic -     Fe+TIBC+Fer -     Rheumatoid factor -     Cyclic citrul peptide antibody, IgG -     ANA  Abnormal blood level of iron -     Fe+TIBC+Fer  Screening for lipid disorders -     Lipid Profile  Polyarthralgia -     Renal Profile with Estimated GFR -     Uric acid -     CK -     HLA-B27 antigen -     Sedimentation rate -     C-reactive protein -     CBC with Differential/Platelet -     Hepatic function panel -     Urinalysis, Routine w reflex microscopic -     Fe+TIBC+Fer -     Rheumatoid factor -     Cyclic citrul peptide antibody, IgG -     ANA  Cervicalgia -     Ambulatory referral to Physical Therapy  Swelling of left hand -     Ambulatory referral to Physical Therapy  Swelling of left ankle joint -     Ambulatory referral to Physical Therapy  Family history of systemic lupus erythematosus -     ANA  Acute right-sided low back pain without sciatica -     cyclobenzaprine (FLEXERIL) 5 MG tablet; Take 1-2 tablets (5-10 mg total) by mouth at bedtime as needed for muscle spasms (muscle pain). -     Ambulatory referral to Physical Therapy   - Personally reviewed PMH, PSH, PFH, medications, allergies, HM - Age-appropriate cancer screening: n/a - Tdap UTD per patient - PHQ2 negative  Polyarthralgia, LBP History raises clinical suspicion for an underlying rheumatologic disorder including RA and AS No red flag symptoms or cauda equina. Normal  Lspine x-ray Rheumatologic work-up pending as above Start formal PT Cont NSAID Start muscle relaxer at bedtime Consider follow-up with Sports Medicine if not responding to conservative measures   Patient education and anticipatory guidance given Patient agrees with treatment plan Follow-up in 1 week to review lab results or sooner as needed if symptoms worsen or fail to improve  Darlyne Russian PA-C

## 2018-11-05 LAB — HEPATIC FUNCTION PANEL
AG Ratio: 1.9 (calc) (ref 1.0–2.5)
ALT: 28 U/L (ref 9–46)
AST: 21 U/L (ref 10–40)
Albumin: 4.5 g/dL (ref 3.6–5.1)
Alkaline phosphatase (APISO): 77 U/L (ref 36–130)
Bilirubin, Direct: 0.1 mg/dL (ref 0.0–0.2)
Globulin: 2.4 g/dL (calc) (ref 1.9–3.7)
Indirect Bilirubin: 0.3 mg/dL (calc) (ref 0.2–1.2)
Total Bilirubin: 0.4 mg/dL (ref 0.2–1.2)
Total Protein: 6.9 g/dL (ref 6.1–8.1)

## 2018-11-05 LAB — CK: Total CK: 27 U/L — ABNORMAL LOW (ref 44–196)

## 2018-11-05 LAB — CBC WITH DIFFERENTIAL/PLATELET
Absolute Monocytes: 574 cells/uL (ref 200–950)
Basophils Absolute: 26 cells/uL (ref 0–200)
Basophils Relative: 0.3 %
Eosinophils Absolute: 496 cells/uL (ref 15–500)
Eosinophils Relative: 5.7 %
HCT: 44 % (ref 38.5–50.0)
Hemoglobin: 14.8 g/dL (ref 13.2–17.1)
Lymphs Abs: 2410 cells/uL (ref 850–3900)
MCH: 30.8 pg (ref 27.0–33.0)
MCHC: 33.6 g/dL (ref 32.0–36.0)
MCV: 91.7 fL (ref 80.0–100.0)
MPV: 11.1 fL (ref 7.5–12.5)
Monocytes Relative: 6.6 %
Neutro Abs: 5194 cells/uL (ref 1500–7800)
Neutrophils Relative %: 59.7 %
Platelets: 235 10*3/uL (ref 140–400)
RBC: 4.8 10*6/uL (ref 4.20–5.80)
RDW: 12 % (ref 11.0–15.0)
Total Lymphocyte: 27.7 %
WBC: 8.7 10*3/uL (ref 3.8–10.8)

## 2018-11-05 LAB — RENAL PROFILE WITH ESTIMATED GFR
Albumin: 4.5 g/dL (ref 3.6–5.1)
BUN: 13 mg/dL (ref 7–25)
CO2: 27 mmol/L (ref 20–32)
Calcium: 9.5 mg/dL (ref 8.6–10.3)
Chloride: 104 mmol/L (ref 98–110)
Creat: 1.13 mg/dL (ref 0.60–1.35)
GFR, Est African American: 105 mL/min/{1.73_m2} (ref >=60)
GFR, Est Non African American: 90 mL/min/{1.73_m2} (ref >=60)
Glucose, Bld: 86 mg/dL (ref 65–99)
Phosphorus: 3.3 mg/dL (ref 2.5–4.5)
Potassium: 4.1 mmol/L (ref 3.5–5.3)
Sodium: 140 mmol/L (ref 135–146)

## 2018-11-05 LAB — LIPID PANEL
Cholesterol: 151 mg/dL (ref <200)
HDL: 34 mg/dL — ABNORMAL LOW (ref >=40)
LDL Cholesterol (Calc): 89 mg/dL (calc)
Non-HDL Cholesterol (Calc): 117 mg/dL (calc) (ref <130)
Total CHOL/HDL Ratio: 4.4 (calc) (ref <5.0)
Triglycerides: 185 mg/dL — ABNORMAL HIGH (ref <150)

## 2018-11-05 LAB — RHEUMATOID FACTOR: Rhuematoid fact SerPl-aCnc: <14 IU/mL (ref <14)

## 2018-11-05 LAB — URIC ACID: Uric Acid, Serum: 6.1 mg/dL (ref 4.0–8.0)

## 2018-11-05 LAB — IRON,TIBC AND FERRITIN PANEL
%SAT: 17 % (calc) — ABNORMAL LOW (ref 20–48)
Ferritin: 47 ng/mL (ref 38–380)
Iron: 48 ug/dL — ABNORMAL LOW (ref 50–195)
TIBC: 280 mcg/dL (calc) (ref 250–425)

## 2018-11-05 LAB — SEDIMENTATION RATE: Sed Rate: 6 mm/h (ref 0–15)

## 2018-11-05 LAB — C-REACTIVE PROTEIN: CRP: 4.9 mg/L (ref <8.0)

## 2018-11-05 LAB — ANA: Anti Nuclear Antibody (ANA): NEGATIVE

## 2018-11-05 LAB — CYCLIC CITRUL PEPTIDE ANTIBODY, IGG: Cyclic Citrullin Peptide Ab: 25 UNITS — ABNORMAL HIGH

## 2018-11-05 LAB — HLA-B27 ANTIGEN: HLA-B27 Antigen: NEGATIVE

## 2018-11-08 ENCOUNTER — Encounter: Payer: Self-pay | Admitting: Physician Assistant

## 2018-11-08 DIAGNOSIS — R7989 Other specified abnormal findings of blood chemistry: Secondary | ICD-10-CM

## 2018-11-08 DIAGNOSIS — R768 Other specified abnormal immunological findings in serum: Secondary | ICD-10-CM

## 2018-11-08 HISTORY — DX: Other specified abnormal findings of blood chemistry: R79.89

## 2018-11-08 HISTORY — DX: Other specified abnormal immunological findings in serum: R76.8

## 2018-11-09 ENCOUNTER — Ambulatory Visit: Payer: Managed Care, Other (non HMO) | Admitting: Physical Therapy

## 2018-11-09 ENCOUNTER — Other Ambulatory Visit: Payer: Self-pay

## 2018-11-09 ENCOUNTER — Encounter: Payer: Self-pay | Admitting: Physical Therapy

## 2018-11-09 DIAGNOSIS — R29898 Other symptoms and signs involving the musculoskeletal system: Secondary | ICD-10-CM | POA: Diagnosis not present

## 2018-11-09 DIAGNOSIS — M6281 Muscle weakness (generalized): Secondary | ICD-10-CM

## 2018-11-09 DIAGNOSIS — M25672 Stiffness of left ankle, not elsewhere classified: Secondary | ICD-10-CM

## 2018-11-09 DIAGNOSIS — M25661 Stiffness of right knee, not elsewhere classified: Secondary | ICD-10-CM | POA: Diagnosis not present

## 2018-11-09 NOTE — Patient Instructions (Signed)
Access Code: [email or web address]  URL: [email or web address]  Date: 11/09/2018  Prepared by: Faustino Congress   Exercises  Supine Hamstring Stretch with Strap - 3 reps - 1 sets - 30 sec hold - 2x daily - 7x weekly  Supine Hamstring Stretch - 3 reps - 1 sets - 30 sec hold - 2x daily - 7x weekly  Prone Quadriceps Stretch with Strap - 3 reps - 1 sets - 30 sec hold - 2x daily - 7x weekly  Gastroc Stretch on Wall - 3 reps - 1 sets - 30 sec hold - 2x daily - 7x weekly  Patient Education  Trigger Point Dry Needling

## 2018-11-09 NOTE — Therapy (Signed)
[place]  [place], [place], [address] Phone: [phone]   Fax:  [phone]  Physical Therapy Evaluation  Patient Details  Name: Johnny Conner MRN: [id number] Date of Birth: 04-27-1996 Referring Provider (PT): Trixie Dredge, [place]   Encounter Date: 11/09/2018  PT End of Session - 11/09/18 0914    Visit Number  1    Number of Visits  6    Date for PT Re-Evaluation  12/21/18    PT Start Time  0830    PT Stop Time  0908    PT Time Calculation (min)  38 min    Activity Tolerance  Patient tolerated treatment well    Behavior During Therapy  [facility] for tasks assessed/performed       Past Medical History:  Diagnosis Date  . Acute IgA nephropathy   . Acute kidney injury (AKI) with acute tubular necrosis (ATN) (HCC)   . CAP (community acquired pneumonia) 03/11/2017  . 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 GERD (gastroesophageal reflux disease)   . 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 Pneumonia ~ 1999  . Pyelonephritis 2012   treated with 3 days of unspecified abx    Past Surgical History:  Procedure Laterality Date  . NO PAST SURGERIES      There were no vitals filed for this visit.   Subjective Assessment - 11/09/18 0831    Subjective  Pt is a 23 y/o male who presents to OPPT for 2 month hx of bil knee and ankle stiffness and swelling (Lt worse than Rt), as well as LBP with prolonged sitting (at work).  Lab work was mostly negative for rheumatoid arthritis or other autoimmune disorder.  Pt feels walking and weight bearing will trigger stiffness.    Limitations  Standing;Walking    Patient Stated Goals  improve mobility, try to see if something will help    Currently in Pain?  No/denies   Lt ankle feels "uncomfortable"; Rt knee  stiffness; denies pain just stiffness        [facility] PT Assessment - 11/09/18 0835      Assessment   Medical Diagnosis  M25.472 (ICD-10-CM) - Swelling of left ankle joint; M54.5 (ICD-10-CM) - Acute right-sided low back pain without sciatica     Referring Provider (PT)  Trixie Dredge, PA-C    Onset Date/Surgical Date  --   2 months ago   Hand Dominance  Right    Next MD Visit  11/11/2018    Prior Therapy  none      Precautions   Precautions  None      Restrictions   Weight Bearing Restrictions  No      Balance Screen   Has the patient fallen in the past 6 months  No    Has the patient had a decrease in activity level because of a fear of falling?   No    Is the patient reluctant to leave their home because of a fear of falling?   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    Leisure  work on cars, day trips; regular exercise - walking, home workouts, 5x/wk to gym when open      Cognition   Overall Cognitive Status  Within Functional Limits for tasks assessed      Posture/Postural Control   Posture/Postural Control  Postural limitations    Postural Limitations  Rounded Shoulders;Forward head      ROM / Strength   AROM / PROM / Strength  AROM;Strength      AROM   Overall AROM Comments  all motions bil LEs WFL      Strength   Strength Assessment Site  Hip;Knee;Ankle    Right/Left Hip  Right;Left    Right Hip Flexion  4/5    Right Hip Extension  4/5    Right Hip ABduction  3+/5    Left Hip Flexion  5/5    Left Hip Extension  4/5    Left Hip ABduction  3+/5    Right/Left Knee  Right;Left    Right Knee Flexion  3+/5     Right Knee Extension  4/5    Left Knee Flexion  5/5    Left Knee Extension  5/5    Right/Left Ankle  Right;Left    Right Ankle Dorsiflexion  5/5    Left Ankle Dorsiflexion  4/5      Flexibility   Soft Tissue Assessment /Muscle Length  yes   tightness in gastrocs bil   Hamstrings  tightness bil    Quadriceps  tightness bil    Piriformis  tightness bil      Palpation   Palpation comment  trigger points noted in Rt hamstring and gastroc      Ambulation/Gait   Gait Pattern  Decreased stance time - right;Decreased hip/knee flexion - right;Antalgic                Objective measurements completed on examination: See above findings.      [facility] Adult PT Treatment/Exercise - 11/09/18 0835      Exercises   Exercises  Knee/Hip      Knee/Hip Exercises: Stretches   Passive Hamstring Stretch  Both;1 rep;30 seconds    Passive Hamstring Stretch Limitations  straight knee and bent knee    Quad Stretch  Both;1 rep;30 seconds    Quad Stretch Limitations  prone with strap    Gastroc Stretch  Both;1 rep;30 seconds    Gastroc Stretch Limitations  standing             PT Education - 11/09/18 0914    Education Details  HEP, DN    Person(s) Educated  Patient    Methods  Explanation;Demonstration;Handout    Comprehension  Verbalized understanding;Returned demonstration          PT Long Term Goals - 11/09/18 0920      PT LONG TERM GOAL #1   Title  independent with HEP    Status  New    Target Date  12/21/18      PT LONG TERM GOAL #2   Title  report 50% improvement in symptoms for improved function    Status  New    Target Date  12/21/18      PT LONG TERM GOAL #3   Title  demonstrate at least 4/5 bil LE strength for improved function    Status  New    Target Date  12/21/18             Plan - 11/09/18 0914    Clinical Impression Statement  Pt is a 23 y/o male who presents to OPPT for recent onset of joint stiffness and swelling affecting functional  mobility.  Pt denies pain, but reports stiffness will affect his mobility.  Pt demonstrates trigger points, decreased flexibility and strength which is affecting functional mobility.  Will benefit from PT to maximize function.  Pt still undergoing medical workup for symptoms, and feel symptoms most likely to be address by both PT and specialists.    Personal Factors and Comorbidities  Comorbidity 1    Comorbidities  Cyclic citrullinated peptide (CCP) antibody positive    Examination-Activity Limitations  Stairs;Stand;Locomotion Level    Stability/Clinical Decision Making  Evolving/Moderate complexity    Clinical Decision Making  Moderate    Rehab Potential  Good    PT Frequency  1x / week    PT Duration  6 weeks    PT Treatment/Interventions  ADLs/Self Care Home Management;Cryotherapy;Electrical Stimulation;Moist Heat;Therapeutic exercise;Therapeutic activities;Functional mobility training;Stair training;Gait training;Ultrasound;Neuromuscular re-education;Patient/family education;Manual techniques;Taping;Dry needling    PT Next Visit Plan  review HEP, add strengthening to HEP, progress as able, manual/modalities/DN PRN    PT Home Exercise Plan  Access Code: [email or web address]    Consulted and Agree with Plan of Care  Patient       Patient will benefit from skilled therapeutic intervention in order to improve the following deficits and impairments:  Abnormal gait, Increased fascial restricitons, Increased muscle spasms, Decreased strength, Impaired flexibility, Decreased mobility  Visit Diagnosis: 1. Muscle weakness (generalized)   2. Other symptoms and signs involving the musculoskeletal system   3. Stiffness of left ankle, not elsewhere classified   4. Stiffness of right knee, not elsewhere classified        Problem List Patient Active Problem List   Diagnosis Date Noted  . Cyclic citrullinated peptide (CCP) antibody positive 11/08/2018  . Acute right-sided low back pain without sciatica  11/04/2018  . Family history of systemic lupus erythematosus 11/04/2018  . Swelling of left ankle joint 11/04/2018  . Swelling of left hand 11/04/2018  . Cervicalgia 11/04/2018  . Polyarthralgia 11/04/2018  . History of primary IgA nephropathy 11/04/2018  . Family history of ankylosing spondylitis 11/04/2018  . History of community acquired pneumonia 03/11/2017      Laureen Abrahams, PT, DPT 11/09/18 9:25 AM     [facility] [place], [place], [address] Phone: [phone]   Fax:  [phone]  Name: Johnny Conner MRN: [id number] Date of Birth: 03/30/1996

## 2018-11-11 ENCOUNTER — Encounter: Payer: Self-pay | Admitting: Physician Assistant

## 2018-11-11 ENCOUNTER — Ambulatory Visit (INDEPENDENT_AMBULATORY_CARE_PROVIDER_SITE_OTHER): Payer: Managed Care, Other (non HMO) | Admitting: Physician Assistant

## 2018-11-11 VITALS — BP 107/72 | HR 65 | Temp 98.7°F | Wt 153.0 lb

## 2018-11-11 DIAGNOSIS — R768 Other specified abnormal immunological findings in serum: Secondary | ICD-10-CM | POA: Diagnosis not present

## 2018-11-11 DIAGNOSIS — Z789 Other specified health status: Secondary | ICD-10-CM

## 2018-11-11 DIAGNOSIS — R7681 Abnormal rheumatoid factor and anti-citrullinated protein antibody without rheumatoid arthritis: Secondary | ICD-10-CM

## 2018-11-11 DIAGNOSIS — G1220 Motor neuron disease, unspecified: Secondary | ICD-10-CM | POA: Diagnosis not present

## 2018-11-11 DIAGNOSIS — R29898 Other symptoms and signs involving the musculoskeletal system: Secondary | ICD-10-CM

## 2018-11-11 NOTE — Progress Notes (Signed)
HPI:                                                                THEODUS RAN is a 23 y.o. male who presents to [facility] today for right leg weakness  Interval hx 11/11/18 Patient presented on 11/04/18 to establish care and was having polyarthralgia and LBP Since that time he reports his back pain has improved His rheumatologic work-up was unremarkable apart from a weakly positive CCP with normal RF and inflammatory markers For the last 2 months patient reports right leg becomes stiff and causes difficulty ambulating. States he can walk normally and after sitting for a short period of time to eat lunch for example he will have trouble walking. Has noted a limping gait with normal gait in between. This week he notes that he has new right-sided leg weakness that was noted in physical therapy  Review of Systems  Constitutional: Positive for fatigue.  Musculoskeletal: Positive for arthralgias (shoulders, knees, ankles), back pain (improving), gait problem, joint swelling (L hand, L ankle) and neck stiffness.  Neurological: Negative for weakness and numbness.  All other systems reviewed and are negative.  Past Medical History:  Diagnosis Date  . Acute IgA nephropathy   . Acute kidney injury (AKI) with acute tubular necrosis (ATN) (HCC)   . CAP (community acquired pneumonia) 03/11/2017  . 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 GERD (gastroesophageal reflux disease)   . 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 Pneumonia ~ 1999  . Pyelonephritis 2012   treated with 3 days of unspecified abx   Past Surgical History:  Procedure Laterality Date  . NO PAST SURGERIES     Social History   Tobacco Use  . Smoking status: Former Smoker    Packs/day: 0.10    Years: 5.00    Pack  years: 0.50    Types: Cigarettes    Quit date: 03/28/2016    Years since quitting: 2.6  . Smokeless tobacco: Former User    Types: Snuff, Chew    Quit date: 06/14/2011  Substance Use Topics  . Alcohol use: Yes    Alcohol/week: 1.0 standard drinks    Types: 1 Standard drinks or equivalent per week   family history includes Ankylosing spondylitis in his brother; Arrhythmia in his father; Arthritis in his paternal grandfather; Glaucoma in his paternal grandfather; Lupus in an other family member; Skin cancer in his paternal grandfather; Thyroid disease in his paternal grandmother.     Medications: Current Outpatient Medications  Medication Sig Dispense Refill  . cyclobenzaprine (FLEXERIL) 5 MG tablet Take 1-2 tablets (5-10 mg total) by mouth at bedtime as needed for muscle spasms (muscle pain). 30 tablet 1  . ibuprofen (ADVIL) 600 MG tablet Take 600 mg by mouth every 6 (six) hours as needed.     No current facility-administered medications for this visit.    No Known Allergies     Objective:  BP 107/72   Pulse 65   Temp 98.7 F (37.1 C) (Oral)   Wt 153 lb (69.4 kg)   BMI 22.59  kg/m  Physical Exam Musculoskeletal:     Right lower leg: No edema.     Left lower leg: No edema.  Neurological:     Mental Status: He is alert and oriented to person, place, and time.     Sensory: Sensation is intact. No sensory deficit.     Motor: Weakness present.     Coordination: Coordination is intact. Coordination normal.     Gait: Gait abnormal (antalgic).     Deep Tendon Reflexes: Reflexes abnormal (R slightly more brisk than left).     Reflex Scores:      Tricep reflexes are 2+ on the right side and 2+ on the left side.      Bicep reflexes are 2+ on the right side and 2+ on the left side.      Brachioradialis reflexes are 2+ on the right side and 2+ on the left side.      Patellar reflexes are 3+ on the right side and 2+ on the left side.      Achilles reflexes are 3+ on the right side  and 2+ on the left side.   Left ankle dorsiflexion 4/5, Right ankle dorsiflexion 5/5 Right hip abduction 3+/5, Left hip abduction 4-/5 Right hip extension 4-/5, Left hip extension 4/5   Recent Results (from the past 2160 hour(s))  Renal Profile with Estimated GFR     Status: None   Collection Time: 11/04/18 10:03 AM  Result Value Ref Range   Glucose, Bld 86 65 - 99 mg/dL    Comment: .            Fasting reference interval .    BUN 13 7 - 25 mg/dL   Creat 1.13 0.60 - 1.35 mg/dL   GFR, Est Non African American 90 > OR = 60 mL/min/1.27m   GFR, Est African American 105 > OR = 60 mL/min/1.733m  BUN/Creatinine Ratio NOT APPLICABLE 6 - 22 (calc)   Sodium 140 135 - 146 mmol/L   Potassium 4.1 3.5 - 5.3 mmol/L   Chloride 104 98 - 110 mmol/L   CO2 27 20 - 32 mmol/L   Calcium 9.5 8.6 - 10.3 mg/dL   Phosphorus 3.3 2.5 - 4.5 mg/dL   Albumin 4.5 3.6 - 5.1 g/dL  Uric acid     Status: None   Collection Time: 11/04/18 10:03 AM  Result Value Ref Range   Uric Acid, Serum 6.1 4.0 - 8.0 mg/dL    Comment: Therapeutic target for gout patients: <6.0 mg/dL .   CK     Status: Abnormal   Collection Time: 11/04/18 10:03 AM  Result Value Ref Range   Total CK 27 (L) 44 - 196 U/L  HLA-B27 antigen     Status: None   Collection Time: 11/04/18 10:03 AM  Result Value Ref Range   HLA-B27 Antigen NEGATIVE NEGATIVE  Sedimentation rate     Status: None   Collection Time: 11/04/18 10:03 AM  Result Value Ref Range   Sed Rate 6 0 - 15 mm/h  C-reactive protein     Status: None   Collection Time: 11/04/18 10:03 AM  Result Value Ref Range   CRP 4.9 <8.0 mg/L  CBC with Differential/Platelet     Status: None   Collection Time: 11/04/18 10:03 AM  Result Value Ref Range   WBC 8.7 3.8 - 10.8 Thousand/uL   RBC 4.80 4.20 - 5.80 Million/uL   Hemoglobin 14.8 13.2 - 17.1 g/dL   HCT 44.0 38.5 -  50.0 %   MCV 91.7 80.0 - 100.0 fL   MCH 30.8 27.0 - 33.0 pg   MCHC 33.6 32.0 - 36.0 g/dL   RDW 12.0 11.0 - 15.0 %    Platelets 235 140 - 400 Thousand/uL   MPV 11.1 7.5 - 12.5 fL   Neutro Abs 5,194 1,500 - 7,800 cells/uL   Lymphs Abs 2,410 850 - 3,900 cells/uL   Absolute Monocytes 574 200 - 950 cells/uL   Eosinophils Absolute 496 15 - 500 cells/uL   Basophils Absolute 26 0 - 200 cells/uL   Neutrophils Relative % 59.7 %   Total Lymphocyte 27.7 %   Monocytes Relative 6.6 %   Eosinophils Relative 5.7 %   Basophils Relative 0.3 %  Hepatic function panel     Status: None   Collection Time: 11/04/18 10:03 AM  Result Value Ref Range   Total Protein 6.9 6.1 - 8.1 g/dL   Albumin 4.5 3.6 - 5.1 g/dL   Globulin 2.4 1.9 - 3.7 g/dL (calc)   AG Ratio 1.9 1.0 - 2.5 (calc)   Total Bilirubin 0.4 0.2 - 1.2 mg/dL   Bilirubin, Direct 0.1 0.0 - 0.2 mg/dL   Indirect Bilirubin 0.3 0.2 - 1.2 mg/dL (calc)   Alkaline phosphatase (APISO) 77 36 - 130 U/L   AST 21 10 - 40 U/L   ALT 28 9 - 46 U/L  Fe+TIBC+Fer     Status: Abnormal   Collection Time: 11/04/18 10:03 AM  Result Value Ref Range   Iron 48 (L) 50 - 195 mcg/dL   TIBC 280 250 - 425 mcg/dL (calc)   %SAT 17 (L) 20 - 48 % (calc)   Ferritin 47 38 - 380 ng/mL  Rheumatoid factor     Status: None   Collection Time: 11/04/18 10:03 AM  Result Value Ref Range   Rhuematoid fact SerPl-aCnc <11 <57 IU/mL  Cyclic citrul peptide antibody, IgG     Status: Abnormal   Collection Time: 11/04/18 10:03 AM  Result Value Ref Range   Cyclic Citrullin Peptide Ab 25 (H) UNITS    Comment: Reference Range Negative:            <20 Weak Positive:       20-39 Moderate Positive:   40-59 Strong Positive:     >59 .   Lipid Profile     Status: Abnormal   Collection Time: 11/04/18 10:03 AM  Result Value Ref Range   Cholesterol 151 <200 mg/dL   HDL 34 (L) > OR = 40 mg/dL   Triglycerides 185 (H) <150 mg/dL   LDL Cholesterol (Calc) 89 mg/dL (calc)    Comment: Reference range: <100 . Desirable range <100 mg/dL for primary prevention;   <70 mg/dL for patients with CHD or diabetic patients   with > or = 2 CHD risk factors. Marland Kitchen LDL-C is now calculated using the Martin-Hopkins  calculation, which is a validated novel method providing  better accuracy than the Friedewald equation in the  estimation of LDL-C.  Cresenciano Genre et al. Annamaria Helling. 2620;355(97): 2061-2068  (http://education.[facility].com/faq/FAQ164)    Total CHOL/HDL Ratio 4.4 <5.0 (calc)   Non-HDL Cholesterol (Calc) 117 <130 mg/dL (calc)    Comment: For patients with diabetes plus 1 major ASCVD risk  factor, treating to a non-HDL-C goal of <100 mg/dL  (LDL-C of <70 mg/dL) is considered a therapeutic  option.   ANA     Status: None   Collection Time: 11/04/18 10:03 AM  Result Value Ref Range  Anti Nuclear Antibody (ANA) NEGATIVE NEGATIVE    Comment: ANA IFA is a first line screen for detecting the presence of up to approximately 150 autoantibodies in various autoimmune diseases. A negative ANA IFA result suggests an ANA-associated autoimmune disease is not present at this time, but is not definitive. If there is high clinical suspicion for Sjogren's syndrome, testing for anti-SS-A/Ro antibody should be considered. Anti-Jo-1 antibody should be considered for clinically suspected inflammatory myopathies. . AC-0: Negative . International Consensus on ANA Patterns (https://www.hernandez-brewer.com/) . For additional information, please refer to http://education.[facility].com/faq/FAQ177 (This link is being provided for informational/ educational purposes only.) .    Study Result  CLINICAL DATA:  Low back pain for 2 weeks.  EXAM: LUMBAR SPINE - COMPLETE 4+ VIEW  COMPARISON:  None.  FINDINGS: Normal alignment of the lumbar vertebral bodies. Disc spaces and vertebral bodies are maintained. The facets are normally aligned. No pars defects. The visualized bony pelvis is intact.  IMPRESSION: Normal alignment and no acute bony findings.   Electronically Signed   By: Marijo Sanes  M.D.   On: 10/22/2018 13:23      Assessment and Plan: 23 y.o. male with   .Shiven was seen today for results.  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

## 2018-11-11 NOTE — Patient Instructions (Signed)
Weakness °Weakness is a lack of strength. You may feel weak all over your body (generalized), or you may feel weak in one specific part of your body (focal). Common causes of weakness include: °· Infection and immune system disorders. °· Physical exhaustion. °· Internal bleeding or other blood loss that results in a lack of red blood cells (anemia). °· Dehydration. °· An imbalance in mineral (electrolyte) levels, such as potassium. °· Heart disease, circulation problems, or stroke. °Other causes include: °· Some medicines or cancer treatment. °· Stress, anxiety, or depression. °· Nervous system disorders. °· Thyroid disorders. °· Loss of muscle strength because of age or inactivity. °· Poor sleep quality or sleep disorders. °The cause of your weakness may not be known. Some causes of weakness can be serious, so it is important to see your health care provider. °Follow these instructions at home: °Activity °· Rest as needed. °· Try to get enough sleep. Most adults need 7-8 hours of quality sleep each night. Talk to your health care provider about how much sleep you need each night. °· Do exercises, such as arm curls and leg raises, for 30 minutes at least 2 days a week or as told by your health care provider. This helps build muscle strength. °· Consider working with a physical therapist or trainer who can develop an exercise plan to help you gain muscle strength. °General instructions ° °· Take over-the-counter and prescription medicines only as told by your health care provider. °· Eat a healthy, well-balanced diet. This includes: °? Proteins to build muscles, such as lean meats and fish. °? Fresh fruits and vegetables. °? Carbohydrates to boost energy, such as whole grains. °· Drink enough fluid to keep your urine pale yellow. °· Keep all follow-up visits as told by your health care provider. This is important. °Contact a health care provider if your weakness: °· Does not improve or gets worse. °· Affects your  ability to think clearly. °· Affects your ability to do your normal daily activities. °Get help right away if you: °· Develop sudden weakness, especially on one side of your face or body. °· Have chest pain. °· Have trouble breathing or shortness of breath. °· Have problems with your vision. °· Have trouble talking or swallowing. °· Have trouble standing or walking. °· Are light-headed or lose consciousness. °Summary °· Weakness is a lack of strength. You may feel weak all over your body or just in one specific part of your body. °· Weakness can be caused by a variety of things. In some cases, the cause may be unknown. °· Rest as needed, and try to get enough sleep. Most adults need 7-8 hours of quality sleep each night. °· Eat a healthy, well-balanced diet. °This information is not intended to replace advice given to you by your health care provider. Make sure you discuss any questions you have with your health care provider. °Document Released: 04/14/2005 Document Revised: 11/18/2017 Document Reviewed: 11/18/2017 °Elsevier Patient Education © 2020 [employer] Inc. ° °

## 2018-11-14 ENCOUNTER — Encounter: Payer: Self-pay | Admitting: Physician Assistant

## 2018-11-14 DIAGNOSIS — R29898 Other symptoms and signs involving the musculoskeletal system: Secondary | ICD-10-CM | POA: Insufficient documentation

## 2018-11-14 DIAGNOSIS — Z789 Other specified health status: Secondary | ICD-10-CM | POA: Insufficient documentation

## 2018-11-16 ENCOUNTER — Telehealth: Payer: Self-pay

## 2018-11-16 DIAGNOSIS — R29898 Other symptoms and signs involving the musculoskeletal system: Secondary | ICD-10-CM

## 2018-11-16 NOTE — Telephone Encounter (Signed)
Order placed. Thanks.

## 2018-11-16 NOTE — Telephone Encounter (Signed)
[facility] called and left a message stating they need an order for an orbital x-ray. He is having an MRI. He believes he may have metal shavings in his eye. Please advise.   Order needs to be faxed to [phone]

## 2018-11-17 ENCOUNTER — Other Ambulatory Visit: Payer: Self-pay

## 2018-11-17 ENCOUNTER — Ambulatory Visit (INDEPENDENT_AMBULATORY_CARE_PROVIDER_SITE_OTHER): Payer: Managed Care, Other (non HMO) | Admitting: Physical Therapy

## 2018-11-17 ENCOUNTER — Encounter: Payer: Self-pay | Admitting: Physical Therapy

## 2018-11-17 DIAGNOSIS — M6281 Muscle weakness (generalized): Secondary | ICD-10-CM | POA: Diagnosis not present

## 2018-11-17 DIAGNOSIS — M25672 Stiffness of left ankle, not elsewhere classified: Secondary | ICD-10-CM | POA: Diagnosis not present

## 2018-11-17 DIAGNOSIS — R29898 Other symptoms and signs involving the musculoskeletal system: Secondary | ICD-10-CM

## 2018-11-17 DIAGNOSIS — M25661 Stiffness of right knee, not elsewhere classified: Secondary | ICD-10-CM | POA: Diagnosis not present

## 2018-11-17 NOTE — Therapy (Signed)
[facility], [place], [address] Phone: [phone]   Fax:  [phone]  Physical Therapy Treatment  Patient Details  Name: Johnny Conner MRN: [id number] Date of Birth: [birth year] Referring Provider (PT): Trixie Dredge, [place]   Encounter Date: 11/17/2018  PT End of Session - 11/17/18 1233    Visit Number  2    Number of Visits  6    Date for PT Re-Evaluation  12/21/18    PT Start Time  0837    PT Stop Time  0908    PT Time Calculation (min)  31 min    Activity Tolerance  Patient tolerated treatment well    Behavior During Therapy  [facility] for tasks assessed/performed       Past Medical History:  Diagnosis Date  . Acute IgA nephropathy   . Acute kidney injury (AKI) with acute tubular necrosis (ATN) (HCC)   . CAP (community acquired pneumonia) 03/11/2017  . 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 GERD (gastroesophageal reflux disease)   . 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 Pneumonia ~ 1999  . Pyelonephritis 2012   treated with 3 days of unspecified abx    Past Surgical History:  Procedure Laterality Date  . NO PAST SURGERIES      There were no vitals filed for this visit.  Subjective Assessment - 11/17/18 0837    Subjective  more stiff today, feels like he's getting worse.  sports med thinks it may be a "pinched nerve" in back.  fell getting out of truck and going into house on Monday 7/20 - knee just "gave out." has some pain but mostly weakness.    Limitations  Standing;Walking    Patient Stated Goals  improve mobility, try to see if something will help    Currently in Pain?  No/denies   c/o stiffness        [facility] PT Assessment - 11/17/18 0854      Assessment   Medical Diagnosis  M25.472 (ICD-10-CM) - Swelling  of left ankle joint; M54.5 (ICD-10-CM) - Acute right-sided low back pain without sciatica     Referring Provider (PT)  Trixie Dredge, PA-C    Hand Dominance  Right    Next MD Visit  11/22/2018    Prior Therapy  none      Observation/Other Assessments   Observations  visible atrophy of Rt glutes, quads/hamstrings and gastroc compared to Lt side      Strength   Right Hip Flexion  3+/5    Right Hip Extension  3+/5    Right Knee Flexion  3/5    Right Knee Extension  3+/5    Right Ankle Dorsiflexion  5/5    Left Ankle Dorsiflexion  4/5      Ambulation/Gait   Gait Comments  Decreased Rt hip/knee flexion with noticable Rt knee buckling in stance - no LOB noted at this time but has fallen due to buckling                   [facility] Adult PT Treatment/Exercise - 11/17/18 0840      Exercises   Exercises  Lumbar      Lumbar Exercises: Stretches   Prone on Elbows Stretch  5 reps;60 seconds   continuous   Press Ups  10 reps;10 seconds      Lumbar Exercises: Standing   Other Standing Lumbar Exercises  Rt lateral shift correction 10x10 sec      Knee/Hip Exercises: Aerobic   Recumbent Bike  x 5 min for warm up; too slow for bike to turn on                  PT Long Term Goals - 11/09/18 0920      PT LONG TERM GOAL #1   Title  independent with HEP    Status  New    Target Date  12/21/18      PT LONG TERM GOAL #2   Title  report 50% improvement in symptoms for improved function    Status  New    Target Date  12/21/18      PT LONG TERM GOAL #3   Title  demonstrate at least 4/5 bil LE strength for improved function    Status  New    Target Date  12/21/18            Plan - 11/17/18 1233    Clinical Impression Statement  Pt presents today with worsening symptoms, including stiffness and increased weakness.  Pt scheduled for lumbar MRI today, but feel symptoms may be neurological and may warrant further imaging.  Recommended he proceed with MRI,  and follow up with MD regarding next steps.  Will route today's note to PCP and sports med MD.    Personal Factors and Comorbidities  Comorbidity 1    Comorbidities  Cyclic citrullinated peptide (CCP) antibody positive    Examination-Activity Limitations  Stairs;Stand;Locomotion Level    Stability/Clinical Decision Making  Evolving/Moderate complexity    Rehab Potential  Good    PT Frequency  1x / week    PT Duration  6 weeks    PT Treatment/Interventions  ADLs/Self Care Home Management;Cryotherapy;Electrical Stimulation;Moist Heat;Therapeutic exercise;Therapeutic activities;Functional mobility training;Stair training;Gait training;Ultrasound;Neuromuscular re-education;Patient/family education;Manual techniques;Taping;Dry needling    PT Next Visit Plan  review HEP, add strengthening to HEP, progress as able, manual/modalities/DN PRN    PT Home Exercise Plan  Access Code: [email or web address]    Consulted and Agree with Plan of Care  Patient       Patient will benefit from skilled therapeutic intervention in order to improve the following deficits and impairments:  Abnormal gait, Increased fascial restricitons, Increased muscle spasms, Decreased strength, Impaired flexibility, Decreased mobility  Visit Diagnosis: 1. Muscle weakness (generalized)   2. Other symptoms and signs involving the musculoskeletal system   3. Stiffness of left ankle, not elsewhere classified   4. Stiffness of right knee, not elsewhere classified        Problem List Patient Active Problem List   Diagnosis Date Noted  . Weakness of right lower extremity 11/14/2018  . Rheumatoid factor negative 11/14/2018  . Cyclic citrullinated peptide (CCP) antibody positive 11/08/2018  . Acute right-sided low back pain without sciatica 11/04/2018  . Family history of systemic lupus erythematosus 11/04/2018  . Swelling of left ankle joint 11/04/2018  . Swelling of left hand 11/04/2018  . Cervicalgia 11/04/2018  . Polyarthralgia  11/04/2018  . History of primary IgA nephropathy 11/04/2018  . Family history of ankylosing spondylitis 11/04/2018  . History of community acquired pneumonia 03/11/2017      Laureen Abrahams, PT, DPT 11/17/18 12:35 PM    [facility] [place], [place], [address] Phone: [phone]   Fax:  [phone]  Name: Johnny Conner MRN: [id number] Date of Birth: 1995/11/01

## 2018-11-22 ENCOUNTER — Ambulatory Visit (INDEPENDENT_AMBULATORY_CARE_PROVIDER_SITE_OTHER): Payer: Managed Care, Other (non HMO) | Admitting: Family Medicine

## 2018-11-22 ENCOUNTER — Other Ambulatory Visit: Payer: Self-pay

## 2018-11-22 VITALS — BP 115/76 | HR 83 | Temp 97.7°F | Wt 155.0 lb

## 2018-11-22 DIAGNOSIS — R29898 Other symptoms and signs involving the musculoskeletal system: Secondary | ICD-10-CM | POA: Diagnosis not present

## 2018-11-22 DIAGNOSIS — M6281 Muscle weakness (generalized): Secondary | ICD-10-CM

## 2018-11-22 DIAGNOSIS — G1220 Motor neuron disease, unspecified: Secondary | ICD-10-CM

## 2018-11-22 NOTE — Progress Notes (Signed)
FMLA ---  Intmentent leave for PT and MD visit.

## 2018-11-22 NOTE — Patient Instructions (Signed)
Thank you for coming in today. You should hear about MRI brain and cervical spine soon.  You should also hear about nerve study.  Check back in 2-3 weeks after MRI.   I will complete FMLA form. Make sure to put my name on it.   We are taking this seriously to figure it out and get you better.

## 2018-11-22 NOTE — Progress Notes (Addendum)
Subjective:    I'm seeing this patient as a consultation for:  Trixie Dredge, PA-C   CC: Right leg weakness  HPI: Patient reports 75-month Hx of right knee weakness. Patient denies any pain, injury event, bruising, or swelling. Patient walks with a limp and feels pain in right hip, likely from hip compensation, when walking on it for extended periods. Patient feels unstable and relies on railings to ascend stairs.  He feels as though his leg gives way at times.  He was seen by his PCP for this issue previously.  He had work-up including evaluation with inflammatory markers for rheumatologic etiology that was largely unremarkable.  CK was normal sed rate was normal CRP was slightly elevated.  He notes this is been slowly progressive.  He also has a significant medical problem for history of IgA nephropathy.  He notes his brother has ankylosing spondylitis.  Otherwise he denies any significant family history of neurologic or autoimmune issues.  He feels well otherwise with no other weakness or numbness or loss of function.  He denies any blurry vision or trouble keeping his eyes open.  Past medical history, Surgical history, Family history not pertinant except as noted below, Social history, Allergies, and medications have been entered into the medical record, reviewed, and no changes needed.   Review of Systems: No headache, visual changes, nausea, vomiting, diarrhea, constipation, dizziness, abdominal pain, skin rash, fevers, chills, night sweats, weight loss, swollen lymph nodes, body aches, joint swelling, muscle aches, chest pain, shortness of breath, mood changes, visual or auditory hallucinations.   Objective:    Vitals:   11/23/18 0634  BP: 115/76  Pulse: 83  Temp: 97.7 F (36.5 C)    General: Well Developed, well nourished, and in no acute distress.  Psych: Alert and oriented x3,  Skin: Warm and dry, no rashes noted.  Respiratory: Not using accessory muscles,  speaking in full sentences, trachea midline.  Cardiovascular: Pulses palpable, no extremity edema. Abdomen: Does not appear distended. Neuro: Right lower extremity weakness present. Hip flexion 4/5 right, 5/5 left. Hip abduction 5/5 bilaterally. Hip adduction 5/5 bilaterally. Knee extension 3+/5 right, 4+/5 left. Knee flexion 4/5 right, 5/5 left. Foot dorsiflexion 3/5 right, 4/5 left Foot plantarflexion 4+/5 right, 5/5 left.  Sensation is intact bilateral extremities. Reflexes equal bilateral lower extremities knee and Achilles tendon. Upper extremity strength is equal normal throughout.  Reflexes intact bilateral upper extremities.   Normal coordination.  MSK: Not particularly tender overlying joints with normal range of motion.  No effusion visible.  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    TECHNIQUE: Multiplanar, multisequence surface-coil magnetic resonance imaging of the lumbar spine was performed without contrast.    LEVELS IMAGED: Lower thoracic to the upper sacral region.    FINDINGS:     Alignment: Mild levoscoliotic curvature of the lumbar spine with apex centered at L4-L5.  Vertebrae: Vertebral body heights are maintained.  No marrow signal abnormalities to suggest neoplasm.  Conus medullaris: In normal position. Normal signal and contour.  Degenerative changes:    T12-L1: No substantial canal or foraminal stenosis.   L1-L2: No substantial canal or foraminal stenosis.   L2-L3: No substantial canal or foraminal stenosis.   L3-L4: No substantial canal or foraminal stenosis.   L4-L5: No substantial canal or foraminal stenosis.  L5-S1: No substantial canal or foraminal stenosis.   Upper Sacrum: No focal lesion identified.  Additional comments: T2 hyperintense 2.2 cm  cystic lesion within the lower pole of the right kidney is incompletely characterized left specific worrisome features and statistically likely reflects a simple cyst.      Impression and Recommendations:    Assessment and Plan: 23 y.o. male with  23 y.o. male with 5868-month Hx of significant right knee weakness and resulting limp with significant knee weakness on physical exam. Patient has a Hx of Ig-A Nephropathy. Family Hx of  ankylosing spondylitis. Normal lumbar MRI rules out herniated vertebral disk. Ordered MRI of brain and cervical spine to assess for central nervous system etiology such as multiple sclerosis or cervical spinal stenosis or injury. Ordered nerve conduction study to evaluate for neuromuscular etiology. I will fill out FMLA form for patient's work.. Patient will need intermittent leave to attend PT appointments and office visits.  Recheck in about 3 weeks or after MRIs are back.   Addendum: 11/24/18 PT recommended a locking hinged brace. ROM limiting hinged knee brace fitted in clinic on 7/29. Significant improvement in gait.  Pt also notes that his leg is now so weak he cannot work.  Work note updated and will re-do FMLA and new ST disability forms when they are sent to me.  OOW until 01/25/19.   PDMP not reviewed this encounter. Orders Placed This Encounter  Procedures  . MR Brain Wo Contrast    Standing Status:   Future    Standing Expiration Date:   01/24/2020    Order Specific Question:   ** REASON FOR EXAM (FREE TEXT)    Answer:   BL leg weakess R>L x3 months. Lspine MRI neg    Order Specific Question:   What is the patient's sedation requirement?    Answer:   No Sedation    Order Specific Question:   Does the patient have a pacemaker or implanted devices?    Answer:   No    Order Specific Question:   Preferred imaging location?    [facility]table limit-[facility]    Order Specific Question:   Radiology Contrast Protocol - do NOT remove  file path    Answer:   \\[email or web address]\Radiant\[email or web address].PDF  . MR Cervical Spine Wo Contrast    Standing Status:   Future    Standing Expiration Date:   01/24/2020    Order Specific Question:   ** REASON FOR EXAM (FREE TEXT)    Answer:   Weakness BL LE R>L x3 months. Normal Lspine MRI    Order Specific Question:   What is the patient's sedation requirement?    Answer:   No Sedation    Order Specific Question:   Does the patient have a pacemaker or implanted devices?    Answer:   No    Order Specific Question:   Preferred imaging location?    Answer:   [facility] (table limit-[facility]lbs)    Order Specific Question:   Radiology Contrast Protocol - do NOT remove file path    Answer:   \\[email or web address]\Radiant\[email or web address].PDF  . NCV with EMG(electromyography)    Standing Status:   Future    Standing Expiration Date:   11/23/2019    Order Specific Question:   Where should this test be performed?    Answer:   [facility]   No orders of the defined types were placed in this encounter.   Discussed warning signs or symptoms. Please see  discharge instructions. Patient expresses understanding. I personally was present and performed or re-performed the history, physical exam and medical decision-making activities of this service and have verified that the service and findings are accurately documented in the student's note. ___________________________________________ Clementeen GrahamEvan Lavonta Tillis M.D., ABFM., CAQSM. Primary Care and Sports Medicine Adjunct Instructor of Family Medicine  [employer]

## 2018-11-23 ENCOUNTER — Encounter: Payer: Self-pay | Admitting: Family Medicine

## 2018-11-24 ENCOUNTER — Encounter: Payer: Self-pay | Admitting: Physical Therapy

## 2018-11-24 ENCOUNTER — Encounter: Payer: Self-pay | Admitting: Family Medicine

## 2018-11-24 ENCOUNTER — Ambulatory Visit (INDEPENDENT_AMBULATORY_CARE_PROVIDER_SITE_OTHER): Payer: Managed Care, Other (non HMO) | Admitting: Physical Therapy

## 2018-11-24 ENCOUNTER — Other Ambulatory Visit: Payer: Self-pay

## 2018-11-24 DIAGNOSIS — M25661 Stiffness of right knee, not elsewhere classified: Secondary | ICD-10-CM | POA: Diagnosis not present

## 2018-11-24 DIAGNOSIS — M25672 Stiffness of left ankle, not elsewhere classified: Secondary | ICD-10-CM

## 2018-11-24 DIAGNOSIS — M6281 Muscle weakness (generalized): Secondary | ICD-10-CM

## 2018-11-24 DIAGNOSIS — R29898 Other symptoms and signs involving the musculoskeletal system: Secondary | ICD-10-CM

## 2018-11-24 NOTE — Patient Instructions (Signed)
Access Code: [email or web address]  URL: [email or web address]  Date: 11/24/2018  Prepared by: Faustino Congress   Exercises  Prone Quadriceps Stretch with Strap - 3 reps - 1 sets - 30 sec hold - 2x daily - 7x weekly  Gastroc Stretch on Wall - 3 reps - 1 sets - 30 sec hold - 2x daily - 7x weekly  Seated Long Arc Quad - 10 reps - 1 sets - 5 sec hold - 2x daily - 7x weekly  Seated March - 10 reps - 1 sets - 5 sec hold hold - 2x daily - 7x weekly  Supine Bridge - 10 reps - 1 sets - 5 sec hold - 2x daily - 7x weekly  Supine Short Arc Quad - 10 reps - 1 sets - 5 sec hold - 2x daily - 7x weekly  Seated Hamstring Stretch - 3 reps - 1 sets - 30 sec hold - 2x daily - 7x weekly  Sidelying Hip Abduction - 1 sets - 10 reps - 2x daily - 7x weekly  Patient Education  Trigger Point Dry Needling

## 2018-11-24 NOTE — Therapy (Addendum)
[place] [address] [place], [place], [address] Phone: [phone]   Fax:  [phone]  Physical Therapy Treatment/Discharge Summary  Patient Details  Name: Johnny Conner MRN: [id number] Date of Birth: 22-Jun-1995 Referring Provider (PT): Trixie Dredge, [place]   Encounter Date: 11/24/2018  PT End of Session - 11/24/18 1016    Visit Number  3    Number of Visits  6    Date for PT Re-Evaluation  12/21/18    PT Start Time  0925    PT Stop Time  1010    PT Time Calculation (min)  45 min    Activity Tolerance  Patient tolerated treatment well    Behavior During Therapy  [facility] for tasks assessed/performed       Past Medical History:  Diagnosis Date  . Acute IgA nephropathy   . Acute kidney injury (AKI) with acute tubular necrosis (ATN) (HCC)   . CAP (community acquired pneumonia) 03/11/2017  . 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 GERD (gastroesophageal reflux disease)   . 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 Pneumonia ~ 1999  . Pyelonephritis 2012   treated with 3 days of unspecified abx    Past Surgical History:  Procedure Laterality Date  . NO PAST SURGERIES      There were no vitals filed for this visit.  Subjective Assessment - 11/24/18 0925    Subjective  feeling more strain on hip/calf due to compensation from knee.  plans for MRI of c-spine and head, as well as NCV/EMG.    Limitations  Standing;Walking    Patient Stated Goals  improve mobility, try to see if something will help    Currently in Pain?  No/denies                       [facility] Adult PT Treatment/Exercise - 11/24/18 0926      Knee/Hip Exercises: Stretches   Passive Hamstring Stretch  Both;2 reps;30 seconds   seated   Passive Hamstring Stretch  Limitations  attempted supine - unable    Quad Stretch  Both;2 reps;30 seconds    Quad Stretch Limitations  prone with strap    Piriformis Stretch  Both;2 reps;30 seconds      Knee/Hip Exercises: Aerobic   Recumbent Bike  x 5 min for warm up; too slow for bike to turn on      Knee/Hip Exercises: Seated   Long Arc Quad  Right;10 reps    Long Arc Quad Limitations  5 sec hold; limited motion    Marching  Right;10 reps      Knee/Hip Exercises: Supine   Short Arc [employer]  Right;10 reps    Short Arc Quad Sets Limitations  limited range; 5 sec hold    Bridges  Both;10 reps      Knee/Hip Exercises: Sidelying   Hip ABduction  Right;10 reps             PT Education - 11/24/18 1016    Education Details  strengthening HEP    Person(s) Educated  Patient    Methods  Explanation;Demonstration;Handout    Comprehension  Verbalized understanding;Returned demonstration          PT Long Term Goals - 11/09/18 0920      PT LONG TERM GOAL #  1   Title  independent with HEP    Status  New    Target Date  12/21/18      PT LONG TERM GOAL #2   Title  report 50% improvement in symptoms for improved function    Status  New    Target Date  12/21/18      PT LONG TERM GOAL #3   Title  demonstrate at least 4/5 bil LE strength for improved function    Status  New    Target Date  12/21/18            Plan - 11/24/18 1017    Clinical Impression Statement  Pt presents today with continued weakness, with further studies ordered from MD.  Recommended brace for Rt knee due to buckling, and pt to schedule visit with MD for fitting.  Gaols may need to be revised based on further diagnostic testing, will address once studies complete.  Gentle strengthening program initiated today.    Personal Factors and Comorbidities  Comorbidity 1    Comorbidities  Cyclic citrullinated peptide (CCP) antibody positive    Examination-Activity Limitations  Stairs;Stand;Locomotion Level    Stability/Clinical  Decision Making  Evolving/Moderate complexity    Rehab Potential  Good    PT Frequency  1x / week    PT Duration  6 weeks    PT Treatment/Interventions  ADLs/Self Care Home Management;Cryotherapy;Electrical Stimulation;Moist Heat;Therapeutic exercise;Therapeutic activities;Functional mobility training;Stair training;Gait training;Ultrasound;Neuromuscular re-education;Patient/family education;Manual techniques;Taping;Dry needling    PT Next Visit Plan  review HEP, add strengthening to HEP, progress as able, manual/modalities/DN PRN    PT Home Exercise Plan  Access Code: [email or web address]    Consulted and Agree with Plan of Care  Patient       Patient will benefit from skilled therapeutic intervention in order to improve the following deficits and impairments:  Abnormal gait, Increased fascial restricitons, Increased muscle spasms, Decreased strength, Impaired flexibility, Decreased mobility  Visit Diagnosis: 1. Muscle weakness (generalized)   2. Other symptoms and signs involving the musculoskeletal system   3. Stiffness of left ankle, not elsewhere classified   4. Stiffness of right knee, not elsewhere classified        Problem List Patient Active Problem List   Diagnosis Date Noted  . Weakness of right lower extremity 11/14/2018  . Rheumatoid factor negative 11/14/2018  . Cyclic citrullinated peptide (CCP) antibody positive 11/08/2018  . Acute right-sided low back pain without sciatica 11/04/2018  . Family history of systemic lupus erythematosus 11/04/2018  . Swelling of left ankle joint 11/04/2018  . Swelling of left hand 11/04/2018  . Cervicalgia 11/04/2018  . Polyarthralgia 11/04/2018  . History of primary IgA nephropathy 11/04/2018  . Family history of ankylosing spondylitis 11/04/2018  . History of community acquired pneumonia 03/11/2017      Laureen Abrahams, PT, DPT 11/24/18 10:20 AM     [facility] [place]  [place], [place], [address] Phone: [phone]   Fax:  [phone]  Name: Johnny Conner MRN: [id number] Date of Birth: Jan 02, 1996     PHYSICAL THERAPY DISCHARGE SUMMARY  Visits from Start of Care: 3  Current functional level related to goals / functional outcomes: See above; symptoms resolved after cholecystectomy; pt came by office to update PT   Remaining deficits: N/a, symptoms resolved   Education / Equipment: HEP  Plan: Patient agrees to discharge.  Patient goals were not met. Patient is being discharged due to being pleased with the  current functional level.  ?????    Laureen Abrahams, PT, DPT 01/05/19 10:28 AM  [facility] [place], [place] [address]  [phone] (office) [phone] (fax)

## 2018-11-28 ENCOUNTER — Ambulatory Visit (INDEPENDENT_AMBULATORY_CARE_PROVIDER_SITE_OTHER): Payer: Managed Care, Other (non HMO)

## 2018-11-28 ENCOUNTER — Other Ambulatory Visit: Payer: Self-pay

## 2018-11-28 DIAGNOSIS — R29898 Other symptoms and signs involving the musculoskeletal system: Secondary | ICD-10-CM

## 2018-11-28 DIAGNOSIS — M6281 Muscle weakness (generalized): Secondary | ICD-10-CM

## 2018-11-28 DIAGNOSIS — G1220 Motor neuron disease, unspecified: Secondary | ICD-10-CM

## 2018-11-28 IMAGING — MR MRI HEAD WITHOUT CONTRAST
10 series · 48 of 48 positions shown · non-contrast
Comparison: History of normal lumbar MRI although that study is not
available to me for review. Lumbar plain films [DATE] are
negative.

CLINICAL DATA: 23-year-old male, three-month history of significant
RIGHT knee weakness and resulting limp. Positive family history for
ankylosing spondylitis. History of GOMEZ nephropathy.

EXAM:
MRI HEAD WITHOUT CONTRAST
MRI CERVICAL SPINE WITHOUT CONTRAST
TECHNIQUE: Multiplanar, multiecho pulse sequences of the brain and surrounding
structures, and cervical spine, to include the craniocervical
junction and cervicothoracic junction, were obtained without
intravenous contrast.

[Series 4: DWI · axial · 3.0mm · 1.20mm/px · z∈[-61,+101]mm · 6 of 55 slices shown (1 of 2)]
[im 1/55]
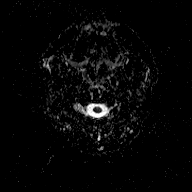
[im 11/55]
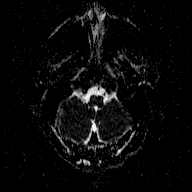
[im 22/55]
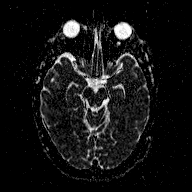
[im 33/55]
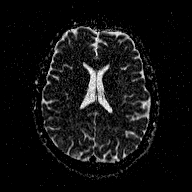
[im 44/55]
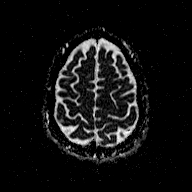
[im 55/55]
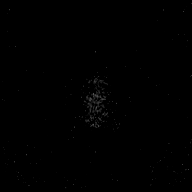

[Series 6: DWI · coronal · 3.0mm · 1.15mm/px · 4 of 47 slices shown (2 of 2)]
[im 1/47]
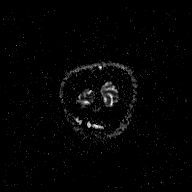
[im 16/47]
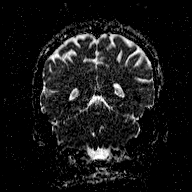
[im 31/47]
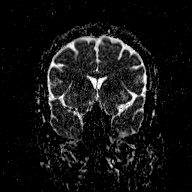
[im 47/47]
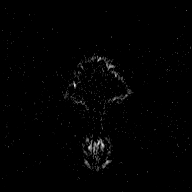

[Series 7: T1 · sagittal · 5.0mm · 0.45mm/px · 2 of 23 slices shown (1 of 2)]
[im 1/23]
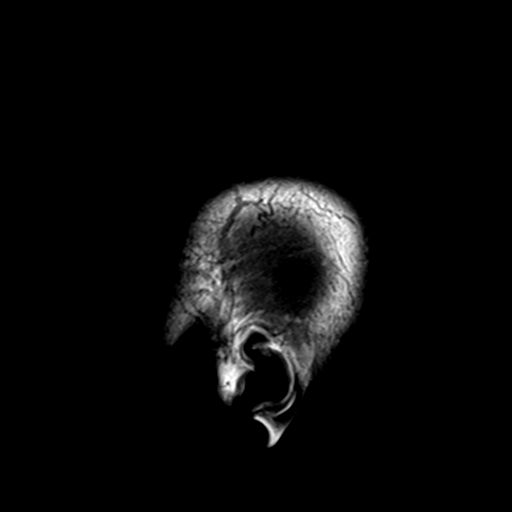
[im 23/23]
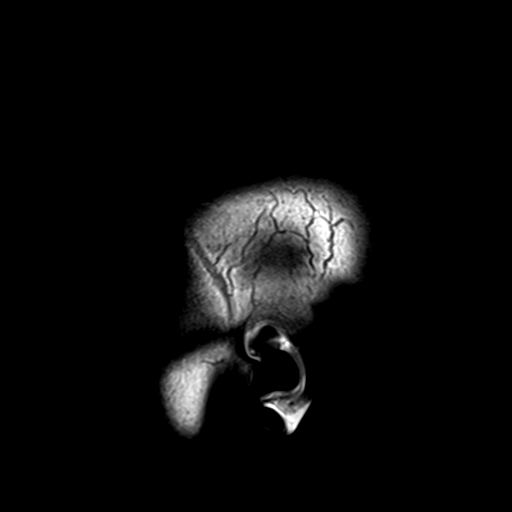

[Series 8: T2 · axial · 5.0mm · 0.72mm/px · z∈[-40,+113]mm · 2 of 23 slices shown (1 of 3)]
[im 1/23]
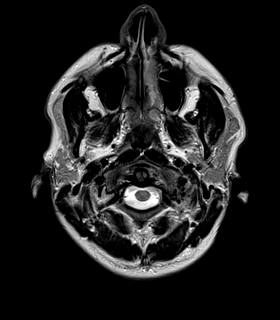
[im 23/23]
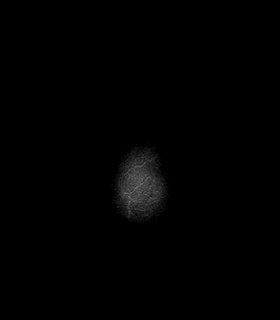

[Series 9: FLAIR · axial · 3.0mm · 0.45mm/px · z∈[-44,+117]mm · 5 of 55 slices shown]
[im 1/55]
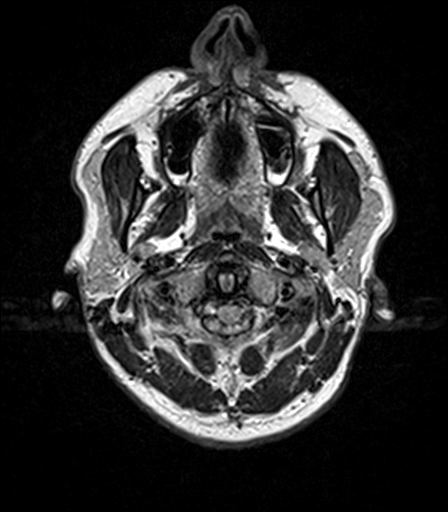
[im 14/55]
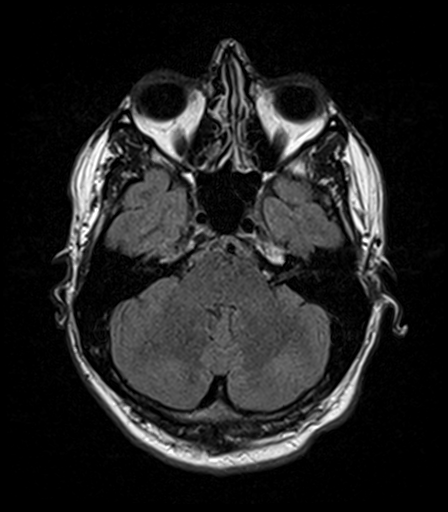
[im 28/55]
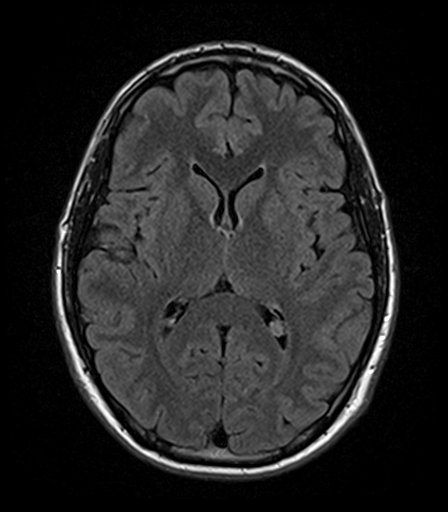
[im 41/55]
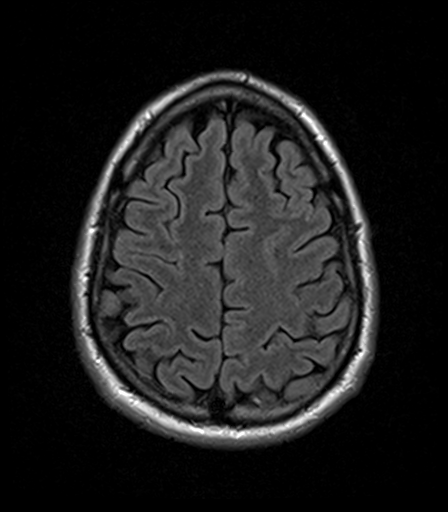
[im 55/55]
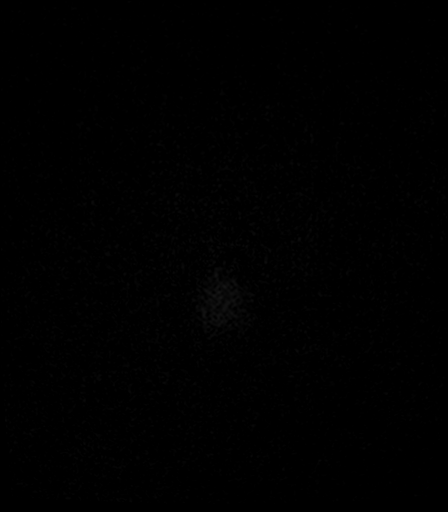

[Series 10: T2 · axial · 5.0mm · 0.72mm/px · z∈[-40,+113]mm · 2 of 23 slices shown (2 of 3)]
[im 1/23]
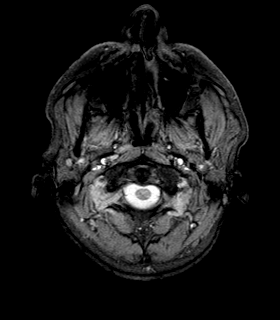
[im 23/23]
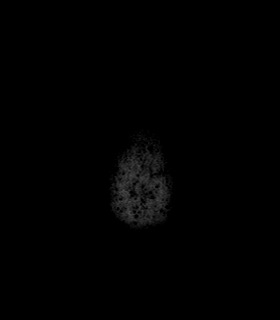

[Series 11: T1 · axial · 1.0mm · 1.00mm/px · z∈[-41,+117]mm · 15 of 160 slices shown (2 of 2)]
[im 1/160]
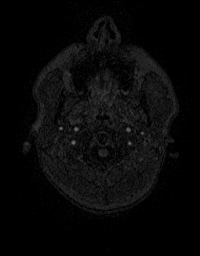
[im 12/160]
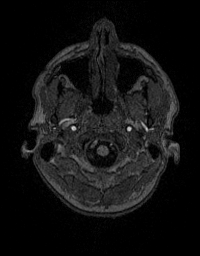
[im 23/160]
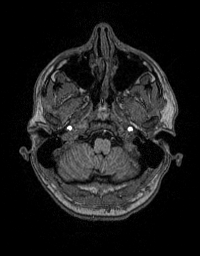
[im 35/160]
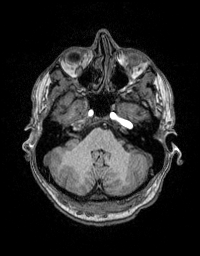
[im 46/160]
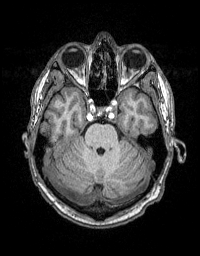
[im 57/160]
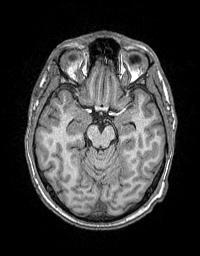
[im 69/160]
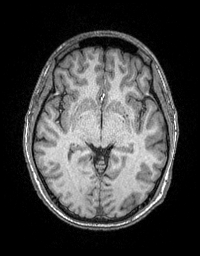
[im 80/160]
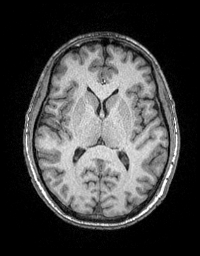
[im 91/160]
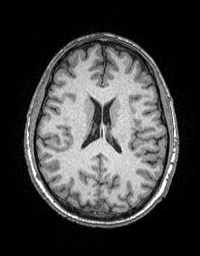
[im 103/160]
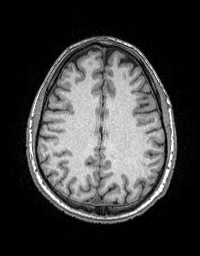
[im 114/160]
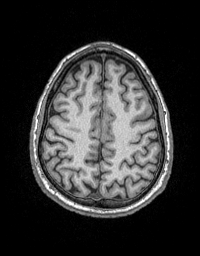
[im 125/160]
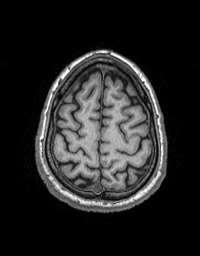
[im 137/160]
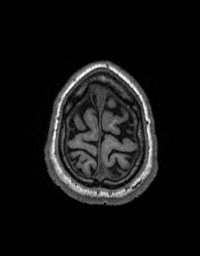
[im 148/160]
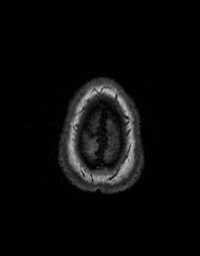
[im 160/160]
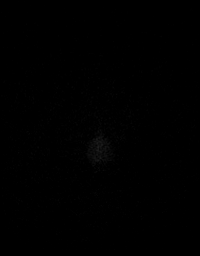

[Series 12: T2 · coronal · 5.0mm · 0.43mm/px · 3 of 29 slices shown (3 of 3)]
[im 1/29]
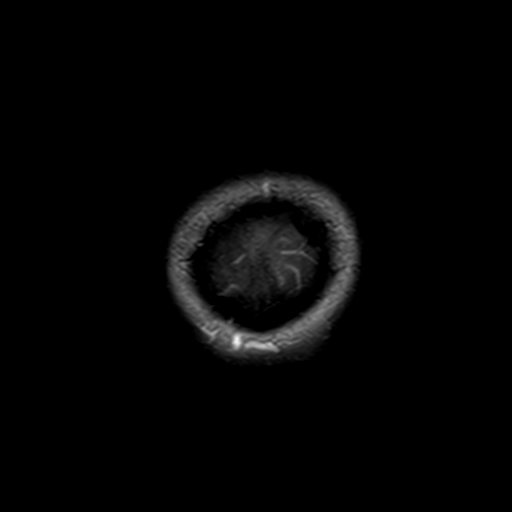
[im 15/29]
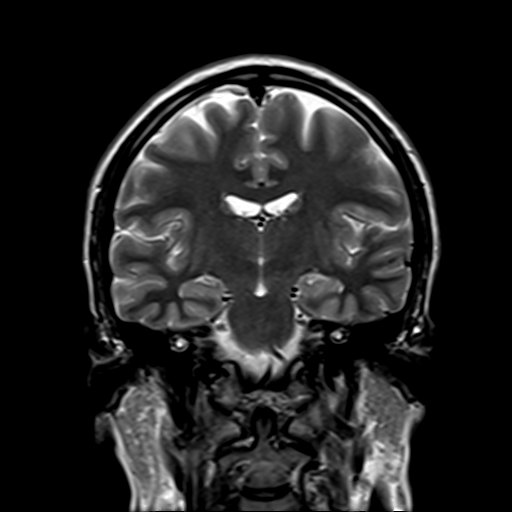
[im 29/29]
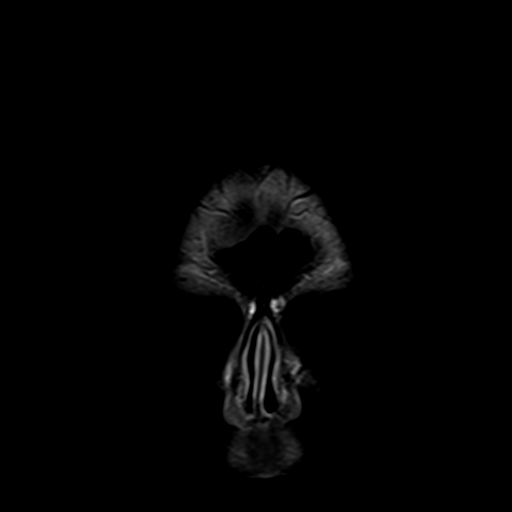

[Series 100: (id) ax · axial · 3.0mm · 1.20mm/px · z∈[-61,+101]mm · 5 of 55 slices shown]
[im 1/55]
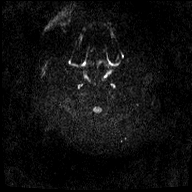
[im 14/55]
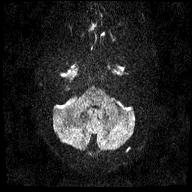
[im 28/55]
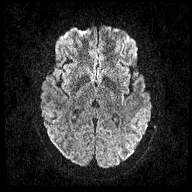
[im 41/55]
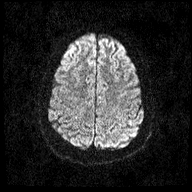
[im 55/55]
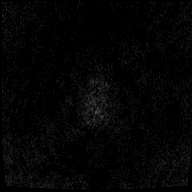

[Series 101: (id) cor · coronal · 3.0mm · 1.15mm/px · 4 of 47 slices shown]
[im 1/47]
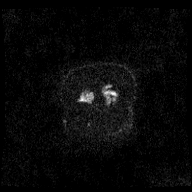
[im 16/47]
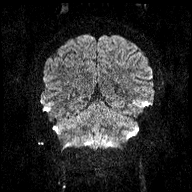
[im 31/47]
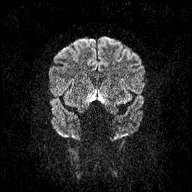
[im 47/47]
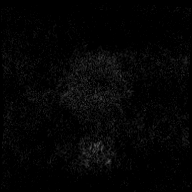

[48 of 48 positions shown; findings below may reference images not displayed]

FINDINGS: MRI HEAD FINDINGS

Brain: No evidence for acute infarction, hemorrhage, mass lesion,
hydrocephalus, or extra-axial fluid. Normal cerebral volume. No
white matter disease.

Vascular: Normal flow voids.

Skull and upper cervical spine: Normal marrow signal. Cervical spine
reported separately.

Sinuses/Orbits: No orbital masses or proptosis. Globes appear
symmetric. Sinuses appear well aerated, without evidence for
air-fluid level.

Other: No nasopharyngeal pathology or mastoid fluid. Scalp and other
visualized extracranial soft tissues grossly unremarkable.

MRI CERVICAL SPINE FINDINGS

Alignment: Physiologic.

Vertebrae: No fracture, evidence of discitis, or bone lesion.

Cord: Normal signal and morphology.

Posterior Fossa, vertebral arteries, paraspinal tissues: Negative.

Disc levels: No disc protrusion or spinal stenosis. No foraminal
narrowing.
IMPRESSION: Negative MRI brain and cervical spine. No evidence of demyelinating
disease, or central lesion resulting in RIGHT leg weakness. No
cervical spinal stenosis or posttraumatic sequelae. No visible
spondyloarthropathy.

## 2018-11-28 IMAGING — MR MRI CERVICAL SPINE WITHOUT CONTRAST
5 series · 34 of 48 positions shown · non-contrast
Comparison: History of normal lumbar MRI although that study is not
available to me for review. Lumbar plain films [DATE] are
negative.

CLINICAL DATA: 23-year-old male, three-month history of significant
RIGHT knee weakness and resulting limp. Positive family history for
ankylosing spondylitis. History of GOMEZ nephropathy.

EXAM:
MRI HEAD WITHOUT CONTRAST
MRI CERVICAL SPINE WITHOUT CONTRAST
TECHNIQUE: Multiplanar, multiecho pulse sequences of the brain and surrounding
structures, and cervical spine, to include the craniocervical
junction and cervicothoracic junction, were obtained without
intravenous contrast.

[Series 3: T2 · sagittal · 3.0mm · 0.56mm/px · 7 of 13 slices shown (1 of 2)]
[im 1/13]
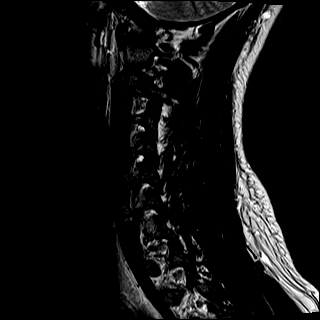
[im 3/13]
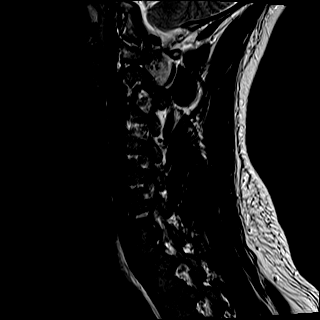
[im 5/13]
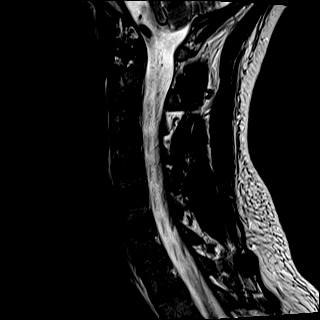
[im 7/13]
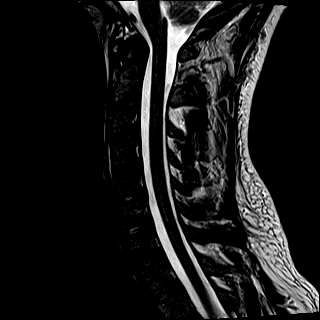
[im 9/13]
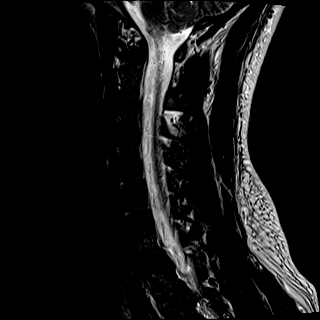
[im 11/13]
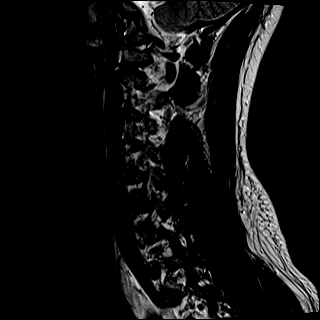
[im 13/13]
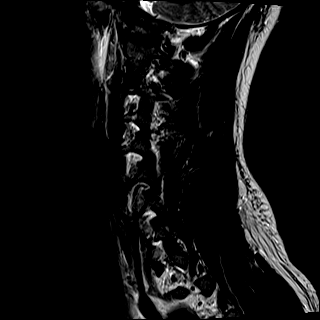

[Series 4: T1 · sagittal · 3.0mm · 0.70mm/px · 7 of 13 slices shown]
[im 1/13]
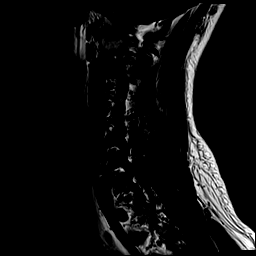
[im 3/13]
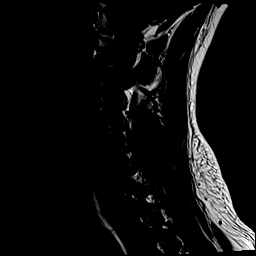
[im 5/13]
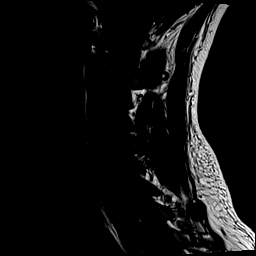
[im 7/13]
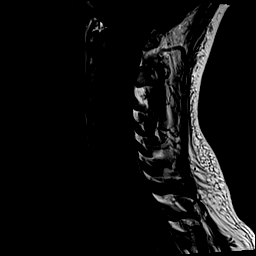
[im 9/13]
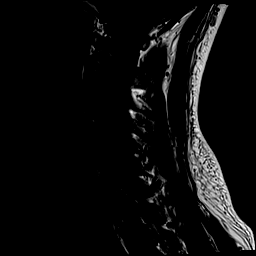
[im 11/13]
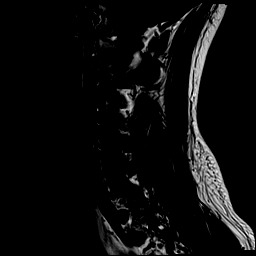
[im 13/13]
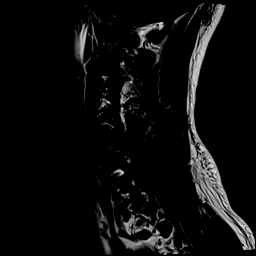

[Series 5: STIR · sagittal · 3.0mm · 0.35mm/px · 6 of 13 slices shown]
[im 1/13]
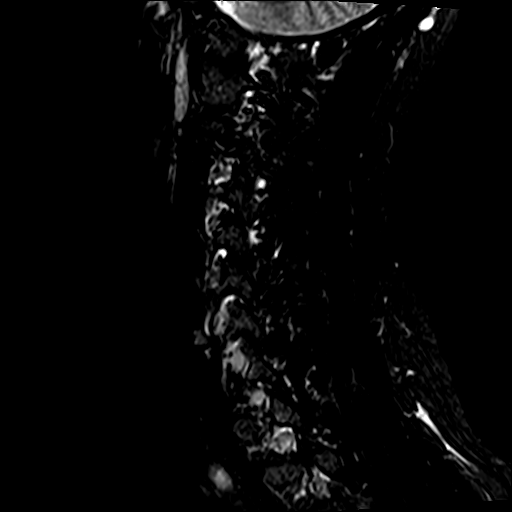
[im 3/13]
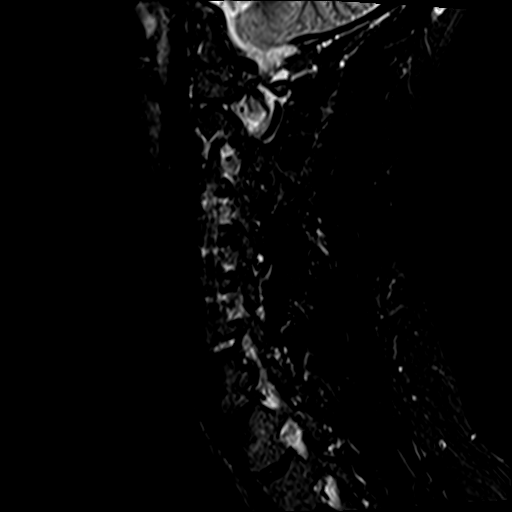
[im 5/13]
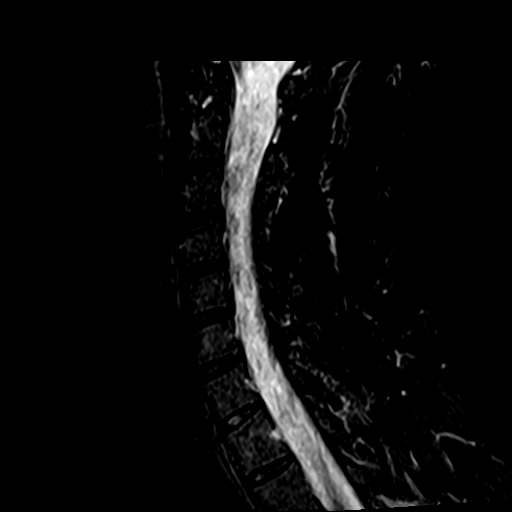
[im 8/13]
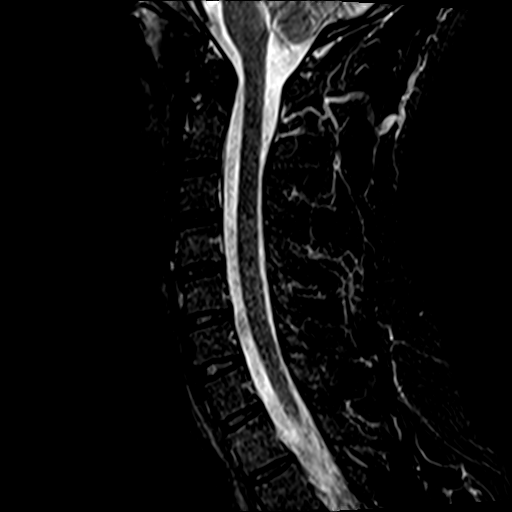
[im 10/13]
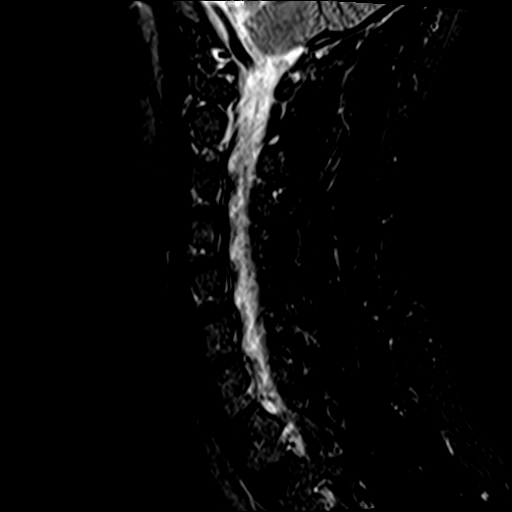
[im 13/13]
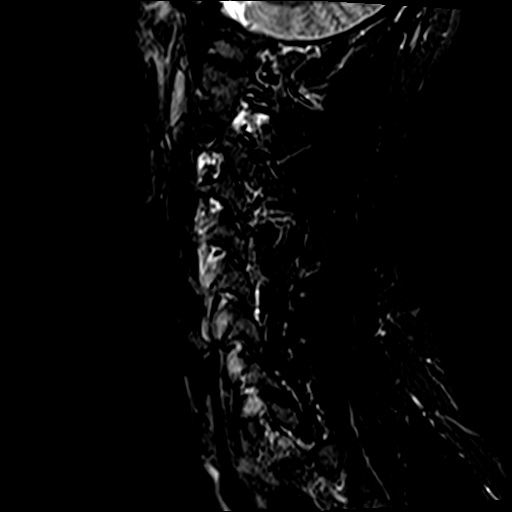

[Series 6: T2 · axial · 3.0mm · 0.62mm/px · z∈[-73,+34]mm · 8 of 29 slices shown (2 of 2)]
[im 1/29]
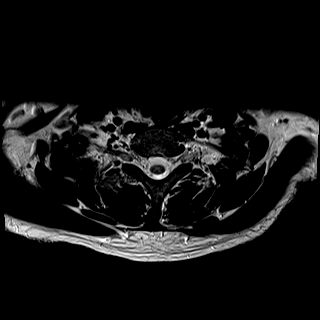
[im 5/29]
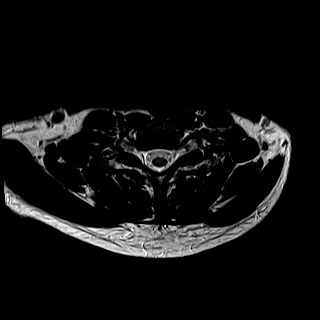
[im 9/29]
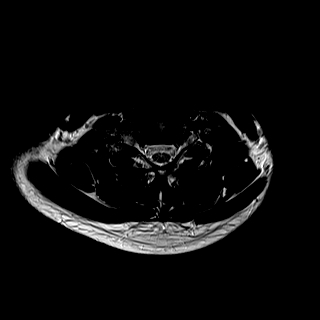
[im 13/29]
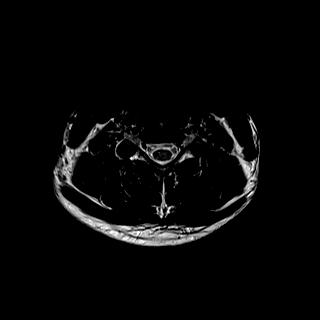
[im 16/29]
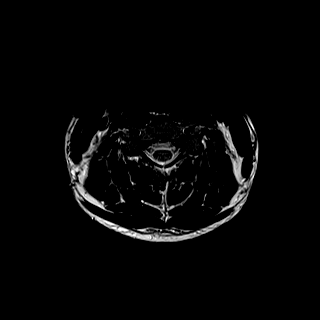
[im 20/29]
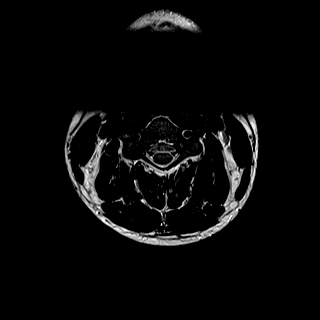
[im 24/29]
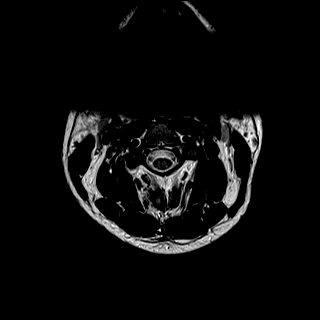
[im 29/29]
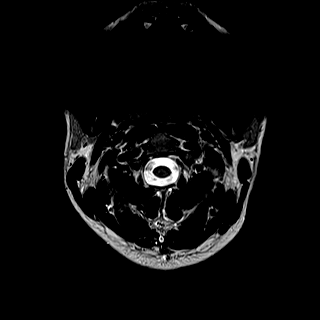

[Series 7: mpgr ax · axial · 3.0mm · 0.35mm/px · z∈[-66,+6]mm · 6 of 29 slices shown]
[im 1/29]
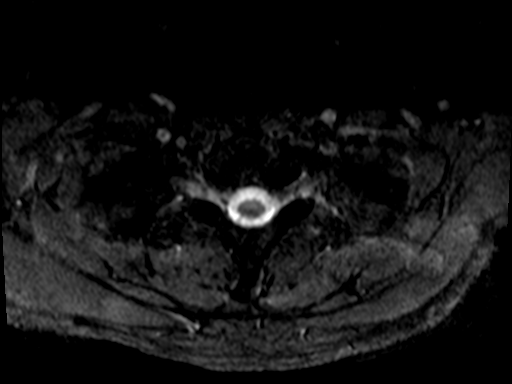
[im 5/29]
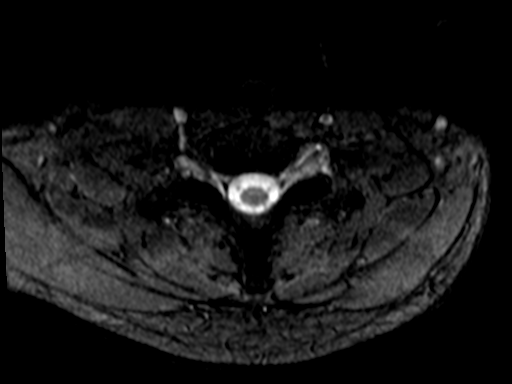
[im 9/29]
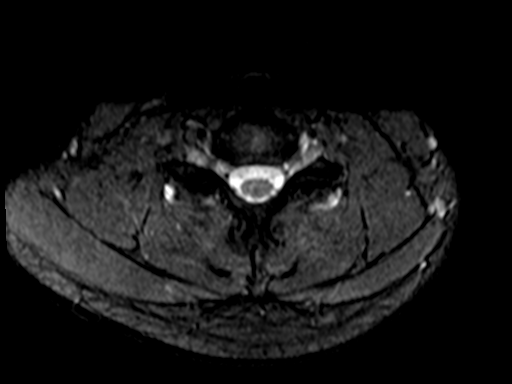
[im 13/29]
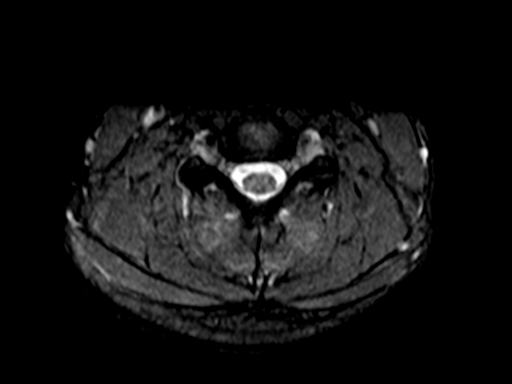
[im 16/29]
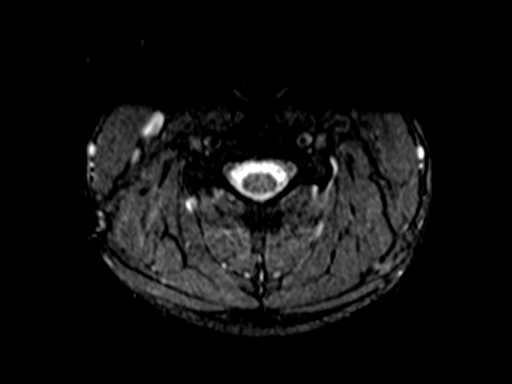
[im 20/29]
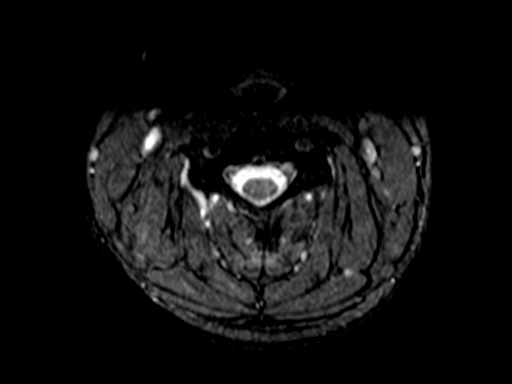

[34 of 48 positions shown; findings below may reference images not displayed]

FINDINGS: MRI HEAD FINDINGS

Brain: No evidence for acute infarction, hemorrhage, mass lesion,
hydrocephalus, or extra-axial fluid. Normal cerebral volume. No
white matter disease.

Vascular: Normal flow voids.

Skull and upper cervical spine: Normal marrow signal. Cervical spine
reported separately.

Sinuses/Orbits: No orbital masses or proptosis. Globes appear
symmetric. Sinuses appear well aerated, without evidence for
air-fluid level.

Other: No nasopharyngeal pathology or mastoid fluid. Scalp and other
visualized extracranial soft tissues grossly unremarkable.

MRI CERVICAL SPINE FINDINGS

Alignment: Physiologic.

Vertebrae: No fracture, evidence of discitis, or bone lesion.

Cord: Normal signal and morphology.

Posterior Fossa, vertebral arteries, paraspinal tissues: Negative.

Disc levels: No disc protrusion or spinal stenosis. No foraminal
narrowing.
IMPRESSION: Negative MRI brain and cervical spine. No evidence of demyelinating
disease, or central lesion resulting in RIGHT leg weakness. No
cervical spinal stenosis or posttraumatic sequelae. No visible
spondyloarthropathy.

## 2018-12-08 ENCOUNTER — Telehealth: Payer: Self-pay | Admitting: Physical Therapy

## 2018-12-08 ENCOUNTER — Encounter: Payer: Managed Care, Other (non HMO) | Admitting: Physical Therapy

## 2018-12-08 NOTE — Telephone Encounter (Signed)
Called pt as he no showed for his appt.  Pt forgot about appt.  Talked with him about current symptoms and he reports currently no change in symptoms.  He hasn't heard about getting scheduled for NCV/EMG so I advised he call Dr. Georgina Snell to investigate and hopefully get this scheduled soon.  Plan to hold PT until after results, as pt has well established HEP and study results could impact course of treatment.  Laureen Abrahams, PT, DPT 12/08/18 9:53 AM

## 2018-12-13 DIAGNOSIS — K819 Cholecystitis, unspecified: Secondary | ICD-10-CM | POA: Insufficient documentation

## 2018-12-16 MED ORDER — ESTROPLUS PO TABS
1.00 | ORAL_TABLET | ORAL | Status: DC
Start: ? — End: 2018-12-16

## 2018-12-17 ENCOUNTER — Ambulatory Visit (INDEPENDENT_AMBULATORY_CARE_PROVIDER_SITE_OTHER): Payer: Managed Care, Other (non HMO) | Admitting: Physician Assistant

## 2018-12-17 ENCOUNTER — Other Ambulatory Visit: Payer: Self-pay

## 2018-12-17 ENCOUNTER — Encounter: Payer: Self-pay | Admitting: Physician Assistant

## 2018-12-17 VITALS — BP 94/63 | HR 68 | Temp 97.7°F | Ht 69.0 in | Wt 156.0 lb

## 2018-12-17 DIAGNOSIS — Z09 Encounter for follow-up examination after completed treatment for conditions other than malignant neoplasm: Secondary | ICD-10-CM

## 2018-12-17 DIAGNOSIS — N281 Cyst of kidney, acquired: Secondary | ICD-10-CM | POA: Diagnosis not present

## 2018-12-17 NOTE — Patient Instructions (Signed)
Renal Mass  A renal mass is a growth in the kidney. A renal mass may be found while performing an MRI, CT scan, or ultrasound for other problems of the abdomen. Certain types of cancers, infections, or injuries can cause a renal mass. A renal mass that is cancerous (malignant) may grow or spread quickly. Others are harmless (benign). What are common types of renal masses? Renal masses include:  Tumors. These may be cancerous (malignant) or noncancerous (benign). ? The most common type of kidney cancer is renal cell carcinoma. ? The most common benign tumors of the kidney include renal adenomas, oncocytomas, and angiomyolipoma (AML).  Cysts. These are fluid-filled sacs that form on or in the kidney. ? It is not always known what causes a cyst to develop in or on the kidney. ? Most kidney cysts do not cause symptoms and do not need to be treated. What type of testing might I need? Your health care provider may recommend that you have tests to diagnose the cause of your renal mass. The following tests may be done if a renal mass is found:  Physical exam.  Blood tests.  Urine tests.  Imaging tests, such as ultrasound, CT scan, or MRI.  Biopsy. This is a small sample that is removed from the renal mass and tested in a lab. The exact tests and how often they are done will depend on:  The size and appearance of the renal mass.  Risk factors or medical conditions that increase your risk for problems.  Any symptoms associated with the renal mass, or concerns that you have about it. Tests and physical exams may be done once, or they may be done regularly for a period of time. Tests and exams that are done regularly will help monitor whether the mass is growing and beginning to cause problems. What are common treatments for renal masses? Treatment is not always needed for this condition. Your health care provider may recommend careful monitoring (watchful waiting) and regular tests and exams.  Treatment will depend on the cause of the mass. Follow these instructions at home: What you need to do at home will depend on the cause of the mass. Follow the instructions that your health care provider gives to you. In general:  Take over-the-counter and prescription medicines only as told by your health care provider.  If you are prescribed an antibiotic medicine, take it as told by your health care provider. Do not stop taking the antibiotic even if you start to feel better.  Follow any restrictions that are given to you by your health care provider.  Keep all follow-up visits as told by your health care provider. This is important. ? You may need to see your health care provider once or twice a year to have CT scans and ultrasounds done. These tests will show if your renal mass has changed or grown bigger. Contact a health care provider if you:  Have pain in the side or back (flank pain).  Have a fever.  Feel full soon after eating.  Have pain or swelling in the abdomen.  Lose weight. Get help right away if:  Your pain gets worse.  There is blood in your urine.  You cannot urinate.  You have chest pain.  You have trouble breathing. Summary  A renal mass is a growth in the kidney. It may be cancerous (malignant) and grow or spread quickly, or it may be harmless (benign).  Renal masses may be found while performing   an MRI, CT scan, or ultrasound for other problems of the abdomen.  Your health care provider may recommend that you have tests to diagnose the cause of your renal mass. This may include a physical exam, blood tests, urine tests, imaging, or a biopsy.  Treatment is not always needed for this condition. Careful monitoring (watchful waiting) may be recommended. This information is not intended to replace advice given to you by your health care provider. Make sure you discuss any questions you have with your health care provider. Document Released: 11/09/2013  Document Revised: 05/21/2017 Document Reviewed: 05/21/2017 Elsevier Patient Education  2020 [employer]  

## 2018-12-17 NOTE — Progress Notes (Signed)
HPI:                                                                Johnny Conner is a 23 y.o. male who presents to [place]: [place] today for hospital follow-up   Underwent cholecystectomy on 12/13/18 for symptomatic cholelithiasis  CBC and CMP were unremarkable He is eating and drinking a normal diet. Denies nausea/vomiting/diarrhea. Has been a little constipated. Last BM yesterday. Voiding normally.   He reports since his surgery his limp and weakness in his right leg has resolved.  He has decided to stop physical therapy and will wait to do EMG if symptoms come back.  Past Medical History:  Diagnosis Date  . Acute IgA nephropathy   . Acute kidney injury (AKI) with acute tubular necrosis (ATN) (HCC)   . CAP (community acquired pneumonia) 03/11/2017  . 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 GERD (gastroesophageal reflux disease)   . 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 Pneumonia ~ 1999  . Pyelonephritis 2012   treated with 3 days of unspecified abx   Past Surgical History:  Procedure Laterality Date  . NO PAST SURGERIES     Social History   Tobacco Use  . Smoking status: Former Smoker    Packs/day: 0.10    Years: 5.00    Pack years: 0.50    Types: Cigarettes    Quit date: 03/28/2016    Years since quitting: 2.7  . Smokeless tobacco: Former User    Types: Snuff, Chew    Quit date: 06/14/2011  Substance Use Topics  . Alcohol use: Yes    Alcohol/week: 1.0 standard drinks    Types: 1 Standard drinks or equivalent per week   family history includes Ankylosing spondylitis in his brother; Arrhythmia in his father; Arthritis in his paternal grandfather; Glaucoma in his paternal grandfather; Lupus in an other family member; Skin cancer in his paternal grandfather; Thyroid  disease in his paternal grandmother.    ROS: negative except as noted in the HPI  Medications: No current outpatient medications on file.   No current facility-administered medications for this visit.    No Known Allergies     Objective:  BP 94/63   Pulse 68   Temp 97.7 F (36.5 C)   Ht '5\' 9"'  (1.753 m)   Wt 156 lb (70.8 kg)   SpO2 99%   BMI 23.04 kg/m  Gen:  alert, not ill-appearing, no distress, appropriate for age 87: head normocephalic without obvious abnormality, conjunctiva and cornea clear, trachea midline Pulm: Normal work of breathing, normal phonation, clear to auscultation bilaterally, no wheezes, rales or rhonchi CV: Normal rate, regular rhythm, s1 and s2 distinct, no murmurs, clicks or rubs  GI: abdomen soft, mildly tender diffusely, surgical incisions without redness or drainage Neuro: alert and oriented x 3, no tremor MSK: extremities atraumatic, normal gait and station Skin: intact, no rashes on exposed skin, no jaundice, no cyanosis Psych: well-groomed, cooperative, good eye contact, euthymic mood, affect mood-congruent, speech is articulate, and thought processes clear and goal-directed  CT Abdomen Pelvis WO IV  Contrast Joaquim Lai Protocol)12/13/2018 [facility] Result Impression  IMPRESSION:  1. Negative for obstructive uropathy or ureteral calculus. 2. Borderline gallbladder wall thickening, without obvious calculi. Consider correlation with right quadrant ultrasound. 3. No evidence for acute appendicitis. 4. Mild diffuse esophageal wall thickening suggesting a possible esophagitis.   Electronically Signed by: Zettie Pho  Result Narrative   INDICATION: flank pain/back pain  TECHNIQUE: CT ABDOMEN PELVIS WITHOUT IV CONTRAST  Dose reduction was utilized (automated exposure control, mA or kV adjustment based on patient size, or iterative image reconstruction).   FINDINGS:  CT ABDOMEN PELVIS   Lower Chest: Unremarkable  Liver:  Unremarkable  Gallbladder and Biliary Tree: Borderline diffuse gallbladder wall thickening, without obvious radiopaque calculi.  Pancreas: Unremarkable  Spleen: Unremarkable  Adrenals: Unremarkable  Kidneys and Urinary Bladder: Small bilateral renal cysts, with the left renal cyst slightly hyperdense, suggesting a Bosniak type I/II cyst. Punctate nonobstructing calculus lower pole right kidney. Old mild right renal cortical scarring. Negative for  obstructive uropathy or ureteral calculus. Unremarkable urinary bladder.  Reproductive: Unremarkable  Abdominal Aorta: Unremarkable  Lymph Nodes: Unremarkable  Peritoneum: No free fluid. No free intraperitoneal air.  Gastrointestinal Tract: Small hiatal hernia. Negative for bowel obstruction. No evidence for acute appendicitis.  Abdominal and Pelvic Walls: Small fat-containing umbilical hernia  Bones: No acute or destructive osseous process.  Other Result Information  Acute Interface, Incoming Rad Results - 12/13/2018  5:29 AM EDT  INDICATION: flank pain/back pain  TECHNIQUE: CT ABDOMEN PELVIS WITHOUT IV CONTRAST  Dose reduction was utilized (automated exposure control, mA or kV adjustment based on patient size, or iterative image reconstruction).   FINDINGS:  CT ABDOMEN PELVIS   Lower Chest: Unremarkable  Liver: Unremarkable  Gallbladder and Biliary Tree: Borderline diffuse gallbladder wall thickening, without obvious radiopaque calculi.  Pancreas: Unremarkable  Spleen: Unremarkable  Adrenals: Unremarkable  Kidneys and Urinary Bladder: Small bilateral renal cysts, with the left renal cyst slightly hyperdense, suggesting a Bosniak type I/II cyst.  Punctate nonobstructing calculus lower pole right kidney.  Old mild right renal cortical scarring.  Negative for  obstructive uropathy or ureteral calculus.  Unremarkable urinary bladder.  Reproductive: Unremarkable  Abdominal Aorta: Unremarkable  Lymph Nodes:  Unremarkable  Peritoneum: No free fluid. No free intraperitoneal air.  Gastrointestinal Tract: Small hiatal hernia.  Negative for bowel obstruction.  No evidence for acute appendicitis.  Abdominal and Pelvic Walls: Small fat-containing umbilical hernia  Bones: No acute or destructive osseous process.    IMPRESSION:  1.  Negative for obstructive uropathy or ureteral calculus. 2.  Borderline gallbladder wall thickening, without obvious calculi.  Consider correlation with right quadrant ultrasound. 3.  No evidence for acute appendicitis. 4.  Mild diffuse esophageal wall thickening suggesting a possible esophagitis.   Electronically Signed by: Zettie Pho    Assessment and Plan: 23 y.o. male with    .[facility] was seen today for hospitalization follow-up.  Diagnoses and all orders for this visit:  Hospital discharge follow-up  Renal cyst, left Comments: Bosniak I/II Orders: -     US Renal; Future   Personally reviewed labs and imaging reports dated 12/13/18 No follow-up labs required. Renal function and CBC normal He is doing well post-op  Discussed that Bosniak I/II cysts are benign and just require active surveillance. Discussed self-monitoring for flank pain/hematuria/urinary symptoms. Repeat renal US in 6 months   Patient education and anticipatory guidance given Patient agrees with treatment plan Follow-up in 6 months or sooner as needed if symptoms worsen or fail to  improve  Darlyne Russian PA-C

## 2019-01-19 ENCOUNTER — Other Ambulatory Visit: Payer: Self-pay

## 2019-01-19 ENCOUNTER — Ambulatory Visit (INDEPENDENT_AMBULATORY_CARE_PROVIDER_SITE_OTHER): Payer: Managed Care, Other (non HMO) | Admitting: Family Medicine

## 2019-01-19 ENCOUNTER — Encounter: Payer: Self-pay | Admitting: Family Medicine

## 2019-01-19 VITALS — BP 115/80 | HR 81 | Temp 97.8°F | Wt 159.0 lb

## 2019-01-19 DIAGNOSIS — N281 Cyst of kidney, acquired: Secondary | ICD-10-CM

## 2019-01-19 DIAGNOSIS — R29898 Other symptoms and signs involving the musculoskeletal system: Secondary | ICD-10-CM

## 2019-01-19 NOTE — Progress Notes (Signed)
Johnny Conner is a 23 y.o. male who presents to [place]: [place] today for follow-up right leg weakness.  Patient had been seen several times for persistent right leg weakness.  He had been unable to work.  He is in the process of proceeding with further treatment and work-up when he had a cholecystectomy December 13, 2018.  He notes since then his right leg weakness has improved.  He notes his strength is returned to normal and he is feeling great.  He is eager to return to work as scheduled on September 28.     ROS as above:  Exam:  BP 115/80   Pulse 81   Temp 97.8 F (36.6 C) (Oral)   Wt 159 lb (72.1 kg)   BMI 23.48 kg/m  Wt Readings from Last 5 Encounters:  01/19/19 159 lb (72.1 kg)  12/17/18 156 lb (70.8 kg)  11/23/18 155 lb (70.3 kg)  11/11/18 153 lb (69.4 kg)  11/04/18 153 lb (69.4 kg)    Gen: Well NAD Right leg normal-appearing normal motion normal strength.  Normal gait.      Assessment and Plan: 23 y.o. male with resolved leg weakness.  Fundamental underlying cause of leg weakness remains unclear however he had spontaneous resolution following cholecystectomy.  I do not have a good medical explanation for his recovery however he is better now so I do not think we need to pursue it further.  Watchful waiting return to work.  Work note provided.  Patient had incidentally noted renal cysts that are due for follow-up with ultrasound in February.  These have already been ordered.  PDMP not reviewed this encounter. No orders of the defined types were placed in this encounter.  No orders of the defined types were placed in this encounter.    Historical information moved to improve visibility of documentation.  Past Medical History:  Diagnosis Date  . Acute IgA nephropathy   . Acute kidney injury (AKI) with acute tubular necrosis (ATN) (HCC)   . CAP  (community acquired pneumonia) 03/11/2017  . 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 GERD (gastroesophageal reflux disease)   . 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 Pneumonia ~ 1999  . Pyelonephritis 2012   treated with 3 days of unspecified abx   Past Surgical History:  Procedure Laterality Date  . NO PAST SURGERIES     Social History   Tobacco Use  . Smoking status: Former Smoker    Packs/day: 0.10    Years: 5.00    Pack years: 0.50    Types: Cigarettes    Quit date: 03/28/2016    Years since quitting: 2.8  . Smokeless tobacco: Former User    Types: Snuff, Chew    Quit date: 06/14/2011  Substance Use Topics  . Alcohol use: Yes    Alcohol/week: 1.0 standard drinks    Types: 1 Standard drinks or equivalent per week   family history includes Ankylosing spondylitis in his brother; Arrhythmia in his father; Arthritis in his paternal grandfather; Glaucoma in his paternal grandfather; Lupus in an other family member; Skin cancer in his paternal grandfather; Thyroid disease in his paternal grandmother.  Medications: No current outpatient medications on file.   No current facility-administered medications for this visit.    No Known  Allergies   Discussed warning signs or symptoms. Please see discharge instructions. Patient expresses understanding.

## 2019-01-19 NOTE — Patient Instructions (Addendum)
Thank you for coming in today. I am glad you are better.  Return to work.  Pay attention and make sure to get the ultrasound in February.  Recheck in 1 year if all is well.  Let us know if you have trouble getting the ultrasound.  Set a reminder on your phone.

## 2019-01-26 ENCOUNTER — Telehealth: Payer: Self-pay | Admitting: Sports Medicine

## 2019-01-26 NOTE — Telephone Encounter (Signed)
I believe I am doing Dr. Luretha Rued stuff today, routing to covering provider.

## 2019-01-26 NOTE — Telephone Encounter (Signed)
Ok to draw up letter than renal function has been stable for over a year with most recently being checked 2 months ago.

## 2019-01-26 NOTE — Telephone Encounter (Signed)
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 Johnny Conner was his most recent PCP.

## 2019-01-27 ENCOUNTER — Encounter: Payer: Self-pay | Admitting: Physician Assistant

## 2019-01-27 NOTE — Telephone Encounter (Signed)
Letter printed. Patient will be by to pick up later.

## 2019-05-23 ENCOUNTER — Other Ambulatory Visit: Payer: Self-pay

## 2019-05-23 ENCOUNTER — Encounter: Payer: Self-pay | Admitting: Emergency Medicine

## 2019-05-23 ENCOUNTER — Emergency Department (INDEPENDENT_AMBULATORY_CARE_PROVIDER_SITE_OTHER)
Admission: EM | Admit: 2019-05-23 | Discharge: 2019-05-23 | Disposition: A | Discharge status: Home / Self Care | Payer: Managed Care, Other (non HMO) | Source: Home / Self Care | Attending: Family Medicine | Admitting: Family Medicine

## 2019-05-23 DIAGNOSIS — R42 Dizziness and giddiness: Secondary | ICD-10-CM | POA: Diagnosis not present

## 2019-05-23 MED ORDER — MECLIZINE HCL 25 MG PO TABS: 25.0000 mg | ORAL_TABLET | Freq: Three times a day (TID) | ORAL | 0 refills | Status: DC | PRN

## 2019-05-23 NOTE — ED Triage Notes (Signed)
Friday, back pain, all weekend felt fatigue,  today felt dizzy. Back pain better today

## 2019-05-23 NOTE — Discharge Instructions (Addendum)
If symptoms become significantly worse during the night or over the weekend, proceed to the local emergency room.  

## 2019-05-23 NOTE — ED Provider Notes (Signed)
Johnny Conner [facility]    CSN: [id number] Arrival date & time: 05/23/19  1615      History   Chief Complaint Chief Complaint  Patient presents with  . Dizziness    HPI Johnny Conner is a 24 y.o. male.   Three days ago patient began to feel fatigued with onset of mild dizziness (disequilibrium), lower back ache, and mild nausea.  He feels better now.  The history is provided by the patient.    Past Medical History:  Diagnosis Date  . Acute IgA nephropathy   . Acute kidney injury (AKI) with acute tubular necrosis (ATN) (HCC)   . CAP (community acquired pneumonia) 03/11/2017  . 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 GERD (gastroesophageal reflux disease)   . 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 Pneumonia ~ 1999  . Pyelonephritis 2012   treated with 3 days of unspecified abx    Patient Active Problem List   Diagnosis Date Noted  . Renal cyst, left 12/17/2018  . Cholecystitis 12/13/2018  . Weakness of right lower extremity 11/14/2018  . Rheumatoid factor negative 11/14/2018  . Cyclic citrullinated peptide (CCP) antibody positive 11/08/2018  . Acute right-sided low back pain without sciatica 11/04/2018  . Family history of systemic lupus erythematosus 11/04/2018  . Swelling of left ankle joint 11/04/2018  . Swelling of left hand 11/04/2018  . Cervicalgia 11/04/2018  . Polyarthralgia 11/04/2018  . History of primary IgA nephropathy 11/04/2018  . Family history of ankylosing spondylitis 11/04/2018  . History of community acquired pneumonia 03/11/2017    Past Surgical History:  Procedure Laterality Date  . NO PAST SURGERIES         Home Medications    Prior to Admission medications   Medication Sig Start Date End Date Taking? Authorizing Provider  meclizine (ANTIVERT) 25 MG tablet Take 1 tablet  (25 mg total) by mouth 3 (three) times daily as needed for dizziness. 05/23/19   Kandra Nicolas, MD    Family History Family History  Problem Relation Age of Onset  . Skin cancer Paternal Grandfather   . Arthritis Paternal Grandfather   . Glaucoma Paternal Grandfather   . Healthy Mother   . Arrhythmia Father   . Ankylosing spondylitis Brother   . Lupus Other   . Thyroid disease Paternal Grandmother     Social History Social History   Tobacco Use  . Smoking status: Former Smoker    Packs/day: 0.10    Years: 5.00    Pack years: 0.50    Types: Cigarettes    Quit date: 03/28/2016    Years since quitting: 3.1  . Smokeless tobacco: Former User    Types: Snuff, Chew    Quit date: 06/14/2011  Substance Use Topics  . Alcohol use: Yes    Alcohol/week: 1.0 standard drinks    Types: 1 Standard drinks or equivalent per week  . Drug use: Never     Allergies   Patient has no known allergies.   Review of Systems Review of Systems No sore throat No cough No pleuritic pain No wheezing No nasal congestion No post-nasal drainage No sinus pain/pressure No itchy/red eyes No earache + dizzy No hemoptysis No SOB No fever/chills + nausea No vomiting No abdominal pain No diarrhea No urinary symptoms No skin rash + fatigue No myalgias No headache  Physical Exam Triage Vital Signs ED Triage Vitals [05/23/19 1725]  Enc Vitals Group     BP 108/71     Pulse Rate 94     Resp      Temp 98.3 F (36.8 C)     Temp Source Oral     SpO2 98 %     Weight 180 lb (81.6 kg)     Height '5\' 9"'  (1.753 m)     Head Circumference      Peak Flow      Pain Score 0     Pain Loc      Pain Edu?      Excl. in [place]?    Orthostatic VS for the past 24 hrs:  BP- Lying Pulse- Lying BP- Sitting Pulse- Sitting BP- Standing at 0 minutes Pulse- Standing at 0 minutes  05/23/19 1728 103/72 85 104/73 93 106/74 100    Updated Vital Signs BP 108/71 (BP Location: Right Arm)   Pulse 94   Temp 98.3  F (36.8 C) (Oral)   Ht '5\' 9"'  (1.753 m)   Wt 81.6 kg   SpO2 98%   BMI 26.58 kg/m   Visual Acuity Right Eye Distance:   Left Eye Distance:   Bilateral Distance:    Right Eye Near:   Left Eye Near:    Bilateral Near:     Physical Exam Nursing notes and Vital Signs reviewed. Appearance:  Patient appears stated age, and in no acute distress Eyes:  Pupils are equal, round, and reactive to light and accomodation.  Extraocular movement is intact.  Conjunctivae are not inflamed.  Fundi benign.  Slight nystagmus to the right  Ears:  Canals normal.  Tympanic membranes normal.  Nose:   Normal turbinates.  No sinus tenderness.   Pharynx:  Normal Neck:  Supple.  No adenopathy   Lungs:  Clear to auscultation.  Breath sounds are equal.  Moving air well. Heart:  Regular rate and rhythm without murmurs, rubs, or gallops.  Abdomen:  Nontender without masses or hepatosplenomegaly.  Bowel sounds are present.  No CVA or flank tenderness.  Extremities:  No edema.  Skin:  No rash present.  Neurologic:  Cranial nerves 2 through 12 are normal.  Patellar, achilles, and elbow reflexes are normal.  Cerebellar function is intact (finger-to-nose and rapid alternating hand movement).  Gait and station are normal.    UC Treatments / Results  Labs (all labs ordered are listed, but only abnormal results are displayed) Labs Reviewed  POCT CBC W AUTO DIFF ([place]):  WBC 7.7; LY 35.8; MO 7.6; GR 56.6; Hgb 14.9; Platelets 258     EKG   Radiology No results found.  Procedures Procedures (including critical care time)  Medications Ordered in UC Medications - No data to display  Initial Impression / Assessment and Plan / UC Course  I have reviewed the triage vital signs and the nursing notes.  Pertinent labs & imaging results that were available during my care of the patient were reviewed by me and considered in my medical decision making (see chart for details).    Note normal CBC.  Benign exam.  Begin Antivert. Followup with Family Doctor if not improved in one week.    Final Clinical Impressions(s) / UC Diagnoses   Final diagnoses:  Vertigo     Discharge Instructions     If symptoms become significantly worse during the night or over the weekend, proceed to the local emergency room.  ED Prescriptions    Medication Sig Dispense Auth. Provider   meclizine (ANTIVERT) 25 MG tablet Take 1 tablet (25 mg total) by mouth 3 (three) times daily as needed for dizziness. 15 tablet Kandra Nicolas, MD        Kandra Nicolas, MD 05/25/19 [id number]

## 2019-05-27 ENCOUNTER — Telehealth: Payer: Self-pay

## 2019-05-27 LAB — POCT CBC W AUTO DIFF (K'VILLE URGENT CARE)

## 2019-06-20 ENCOUNTER — Ambulatory Visit: Payer: Managed Care, Other (non HMO) | Admitting: Osteopathic Medicine

## 2019-07-13 ENCOUNTER — Ambulatory Visit (INDEPENDENT_AMBULATORY_CARE_PROVIDER_SITE_OTHER): Payer: Managed Care, Other (non HMO) | Admitting: Sports Medicine

## 2019-07-13 ENCOUNTER — Other Ambulatory Visit: Payer: Self-pay

## 2019-07-13 ENCOUNTER — Encounter: Payer: Self-pay | Admitting: Sports Medicine

## 2019-07-13 DIAGNOSIS — M5416 Radiculopathy, lumbar region: Secondary | ICD-10-CM | POA: Diagnosis not present

## 2019-07-13 MED ORDER — PREDNISONE 50 MG PO TABS: ORAL_TABLET | ORAL | 0 refills | Status: DC

## 2019-07-13 NOTE — Progress Notes (Signed)
    Procedures performed today:    None.  Independent interpretation of notes and tests performed by another provider:   None.  Impression and Recommendations:    Right lumbar radiculopathy Johnny Conner is a pleasant 24 year old male with IgA nephropathy, for many months now he has had back pain, axial and discogenic with radiation down the right leg in an L5 distribution. X-rays last year were for the most part unremarkable. Because his pain has been persistent we are going to proceed with a burst of prednisone, as well as an MRI of the lumbar spine, formal physical therapy. Return to see me in about 4 weeks, if failure of treatment we will proceed with epidural injection +/- gabapentin.    ___________________________________________ Johnny Conner. Johnny Conner, M.D., ABFM., CAQSM. Primary Care and Sports Medicine [facility]  Adjunct Instructor of Family Medicine  [employer]

## 2019-07-13 NOTE — Assessment & Plan Note (Signed)
Johnny Conner is a pleasant 24 year old male with IgA nephropathy, for many months now he has had back pain, axial and discogenic with radiation down the right leg in an L5 distribution. X-rays last year were for the most part unremarkable. Because his pain has been persistent we are going to proceed with a burst of prednisone, as well as an MRI of the lumbar spine, formal physical therapy. Return to see me in about 4 weeks, if failure of treatment we will proceed with epidural injection +/- gabapentin.

## 2019-07-20 ENCOUNTER — Encounter: Payer: Self-pay | Admitting: Rehabilitative and Restorative Service Providers"

## 2019-07-20 ENCOUNTER — Ambulatory Visit (INDEPENDENT_AMBULATORY_CARE_PROVIDER_SITE_OTHER): Payer: Managed Care, Other (non HMO) | Admitting: Rehabilitative and Restorative Service Providers"

## 2019-07-20 ENCOUNTER — Other Ambulatory Visit: Payer: Self-pay

## 2019-07-20 DIAGNOSIS — M5440 Lumbago with sciatica, unspecified side: Secondary | ICD-10-CM | POA: Diagnosis not present

## 2019-07-20 DIAGNOSIS — R29898 Other symptoms and signs involving the musculoskeletal system: Secondary | ICD-10-CM

## 2019-07-20 DIAGNOSIS — M6281 Muscle weakness (generalized): Secondary | ICD-10-CM

## 2019-07-20 NOTE — Patient Instructions (Signed)
Access Code: [email or web address] URL: [email or web address] Date: 07/20/2019 Prepared by: Margretta Ditty  Exercises Supine Hamstring Stretch - 2 x daily - 7 x weekly - 1 sets - 3 reps - 20 seconds hold Prone Press Up - 2 x daily - 7 x weekly - 1 sets - 10 reps

## 2019-07-20 NOTE — Therapy (Addendum)
[facility] [place], [place], [address] Phone: [phone]   Fax:  [phone]  Physical Therapy Evaluation  Patient Details  Name: Johnny Conner MRN: [id number] Date of Birth: [birth year] Referring Provider (PT): Aundria Mems  MD   Encounter Date: 07/20/2019  PT End of Session - 07/20/19 1242    Visit Number  1    Number of Visits  12    Date for PT Re-Evaluation  08/31/19    PT Start Time  0802    PT Stop Time  2671    PT Time Calculation (min)  53 min       Past Medical History:  Diagnosis Date  . Acute IgA nephropathy   . Acute kidney injury (AKI) with acute tubular necrosis (ATN) (HCC)   . CAP (community acquired pneumonia) 03/11/2017  . 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 GERD (gastroesophageal reflux disease)   . 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 Pneumonia ~ 1999  . Pyelonephritis 2012   treated with 3 days of unspecified abx    Past Surgical History:  Procedure Laterality Date  . NO PAST SURGERIES      There were no vitals filed for this visit.   Subjective Assessment - 07/20/19 0804    Subjective  The patient reports tightness in his low back, a sense of compression in thoracic spine, and numbness in R hip radiating into medial knee and bottom of R foot.  Driving and sitting for long periods aggravate it.  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  He has not noticed any improvement with use of prednisone.  He took last week out of work and returned 2 days ago noting worsening of symtpoms.    Pertinent History  IGA nephropathy    Patient Stated Goals  Reduce leg irritation, signs of soothing pain    Currently in Pain?  Yes    Pain Score  3    goes up to a 7/10 with walking    Pain Location  Back    Pain Orientation  Right    Pain Descriptors / Indicators  Discomfort;Aching;Tightness    Pain Type  Acute pain    Pain Radiating Towards  R hip into bottom of foot.    Pain Onset  More than a month ago    Pain Frequency  Constant    Aggravating Factors   sitting for an hour, with fatigue it worsens    Pain Relieving Factors  unsure         [facility] PT Assessment - 07/20/19 0816      Assessment   Medical Diagnosis  Right lumbar radiculopathy    Referring Provider (PT)  Aundria Mems  MD    Onset Date/Surgical Date  --   2 months ago   Hand Dominance  Right      Precautions   Precautions  None      Restrictions   Weight Bearing Restrictions  No      Balance Screen   Has the patient fallen in the past 6 months  No    Has the patient had a decrease in activity level because of a fear of falling?   No    Is the patient reluctant to leave their  home because of a fear of falling?   No      Home Film/video editor residence    Living Arrangements  --   fiance     Prior Function   Level of Independence  Independent    Vocation  Full time employment    Vocation Requirements  driving and lifting- works for [employer]    Leisure  exercise      Observation/Other Assessments   Focus on Therapeutic Outcomes (FOTO)   57% limitation      Sensation   Light Touch  Impaired Detail    Additional Comments  Some awareness of numbness in hip and R leg      Posture/Postural Control   Posture/Postural Control  Postural limitations    Posture Comments  standing with increased L weight shift      ROM / Strength   AROM / PROM / Strength  AROM;Strength      AROM   Overall AROM   Deficits    Overall AROM Comments  worst pain with forward flexion radiating into R leg, pain noted with R sidebend and a stretching pain with L sidebending.    AROM Assessment Site  Lumbar    Lumbar Flexion  75% limited    Lumbar Extension  50% limited     Lumbar - Right Side Bend  25% limited    Lumbar - Left Side Bend  25% limited    Lumbar - Right Rotation  25% limited    Lumbar - Left Rotation  25% limited      Strength   Overall Strength  Deficits    Overall Strength Comments  hyperextends in lumbar spine when attempting prone hip extension    Strength Assessment Site  Hip;Knee;Ankle    Right/Left Hip  Right;Left    Right Hip Flexion  5/5    Left Hip Flexion  5/5    Right/Left Knee  Right;Left    Right Knee Flexion  4/5    Right Knee Extension  5/5    Left Knee Flexion  5/5    Left Knee Extension  5/5    Right/Left Ankle  Right;Left    Right Ankle Dorsiflexion  4/5    Left Ankle Dorsiflexion  4/5      Flexibility   Soft Tissue Assessment /Muscle Length  yes    Hamstrings  -40 degrees R side, -25 degrees     Quadriceps  no tightness noted      Palpation   Spinal mobility  hypomobility PA mobility mid thoracic, low thoracic and lumbar spine    Palpation comment  tender to palpation R QL      Special Tests    Special Tests  Lumbar    Lumbar Tests  FABER test;Slump Test      FABER test   findings  Negative      Slump test   Findings  Positive    Comment  increases low back pain      Ambulation/Gait   Ambulation/Gait  Yes    Gait Pattern  --   R antalgic pattern        TREATMENT:   R hip e-stim + heat (interferential to tolerance) x 12 minutes  HEP established consisting of: hamstring stretching and prone press ups (see patient instructions)       Objective measurements completed on examination: See above findings.  PT Education - 07/20/19 1242    Education Details  HEP    Person(s) Educated  Patient    Methods  Explanation;Demonstration;Handout    Comprehension  Verbalized understanding;Returned demonstration          PT Long Term Goals - 07/20/19 1245      PT LONG TERM GOAL #1   Title  The patient will be indep with HEP.    Time  6    Period  Weeks    Target Date   08/31/19      PT LONG TERM GOAL #2   Title  The patient will report functional limitation < or equal to 32%.    Baseline  57% at eval    Time  6    Period  Weeks    Target Date  08/31/19      PT LONG TERM GOAL #3   Title  The patient will imrpove R hip flexion strength to 5/5.    Time  6    Period  Weeks    Target Date  08/31/19      PT LONG TERM GOAL #4   Title  The patient will imrpove hamstring length (supine 90/90 hip/knee position) from -40 degrees R to -25 degrees to demo imrpoved flexibility.    Time  6    Period  Weeks    Target Date  08/31/19      PT LONG TERM GOAL #5   Title  The patient will report pain <4/10 with walking.    Baseline  7/10    Time  6    Period  Weeks    Target Date  08/31/19             Plan - 07/20/19 1247    Clinical Impression Statement  The patient is a 24 yo male presenting to OP physical therapy with low back pain with radiating symtpoms into R LE.  He presents with impairments in hip strength R LE, ROM lumbar spine, flexbility in LE musculature, hypomobilty thoracic/lumbar joint mobility, and pain in R Low back and LE.  He is functionally limited in community ambulation, work related activities, and has pain limiting function.  PT to address deficits to return to prior functional status.    Personal Factors and Comorbidities  Comorbidity 1    Comorbidities  IGA nephropathy    Examination-Activity Limitations  Locomotion Level;Lift;Squat;Stairs;Stand    Examination-Participation Restrictions  Community Activity;Driving    Stability/Clinical Decision Making  Stable/Uncomplicated    Clinical Decision Making  Low    Rehab Potential  Good    PT Frequency  2x / week    PT Duration  6 weeks    PT Treatment/Interventions  ADLs/Self Care Home Management;Gait training;Stair training;Traction;Electrical Stimulation;Moist Heat;Therapeutic exercise;Therapeutic activities;Functional mobility training;Patient/family education;Manual  techniques;Taping;Dry needling    PT Next Visit Plan  progress HEP, lumbar stabilization, begin strengthening for standing core/modified squats, flexibility.  Consider other modalities as needed for pain (dry needling, traction, continue with heat/e-stim if needed)    PT Home Exercise Plan  Access Code: [email or web address]    Consulted and Agree with Plan of Care  Patient       Patient will benefit from skilled therapeutic intervention in order to improve the following deficits and impairments:  Pain, Hypomobility, Impaired sensation, Impaired flexibility, Postural dysfunction, Decreased strength, Decreased range of motion, Increased fascial restricitons, Increased muscle spasms, Decreased activity tolerance  Visit Diagnosis: Muscle weakness (generalized)  Acute right-sided low back pain with sciatica,  sciatica laterality unspecified  Other symptoms and signs involving the musculoskeletal system     Problem List Patient Active Problem List   Diagnosis Date Noted  . Right lumbar radiculopathy 07/13/2019  . Renal cyst, left 12/17/2018  . Cholecystitis 12/13/2018  . Weakness of right lower extremity 11/14/2018  . Rheumatoid factor negative 11/14/2018  . Cyclic citrullinated peptide (CCP) antibody positive 11/08/2018  . Family history of systemic lupus erythematosus 11/04/2018  . Swelling of left ankle joint 11/04/2018  . Swelling of left hand 11/04/2018  . Cervicalgia 11/04/2018  . Polyarthralgia 11/04/2018  . History of primary IgA nephropathy 11/04/2018  . Family history of ankylosing spondylitis 11/04/2018  . History of community acquired pneumonia 03/11/2017    [place], PT 07/20/2019, 12:51 PM  [facility] [place], [place], [address] Phone: [phone]   Fax:  [phone]  Name: Johnny Conner MRN: [id number] Date of Birth: 1996/02/13

## 2019-07-21 ENCOUNTER — Telehealth: Payer: Self-pay

## 2019-07-21 NOTE — Telephone Encounter (Signed)
Advised patient to get documents from HR.  Pt is to schedule an appointment with Dr. Karie Schwalbe for the completion of documentation.

## 2019-07-21 NOTE — Telephone Encounter (Signed)
Pt wants to discuss the possibility of short-term disability.   Says he is getting slightly worse and it's hard to perform work duties.

## 2019-07-21 NOTE — Telephone Encounter (Signed)
That is reasonable until this is treated.  He will typically need to take this to his HR people, and they will give him some paperwork for me to fill out.

## 2019-07-25 ENCOUNTER — Ambulatory Visit (INDEPENDENT_AMBULATORY_CARE_PROVIDER_SITE_OTHER): Payer: Managed Care, Other (non HMO) | Admitting: Sports Medicine

## 2019-07-25 ENCOUNTER — Other Ambulatory Visit: Payer: Self-pay

## 2019-07-25 ENCOUNTER — Encounter: Payer: Self-pay | Admitting: Sports Medicine

## 2019-07-25 DIAGNOSIS — M5416 Radiculopathy, lumbar region: Secondary | ICD-10-CM

## 2019-07-25 NOTE — Assessment & Plan Note (Signed)
Johnny Conner returns, he is a pleasant 24 year old male, he has IgA nephropathy, as well as right leg L5 distribution radiculitis. He is a Naval architect. He did not respond to prednisone, he is currently doing physical therapy, we also ordered an MRI which she has yet to do. If failure of 4 weeks of therapy we will proceed with an epidural injection +/- gabapentin. I filled out his FMLA paperwork today.

## 2019-07-25 NOTE — Progress Notes (Signed)
    Procedures performed today:    None.  Independent interpretation of notes and tests performed by another provider:   None.  Brief History, Exam, Impression, and Recommendations:    Right lumbar radiculopathy Johnny Conner returns, he is a pleasant 24 year old male, he has IgA nephropathy, as well as right leg L5 distribution radiculitis. He is a [employer] architect. He did not respond to prednisone, he is currently doing physical therapy, we also ordered an MRI which she has yet to do. If failure of 4 weeks of therapy we will proceed with an epidural injection +/- gabapentin. I filled out his FMLA paperwork today.    ___________________________________________ Ihor Austin. Benjamin Stain, M.D., ABFM., CAQSM. Primary Care and Sports Medicine [facility]  Adjunct Instructor of Family Medicine  [employer]

## 2019-07-27 ENCOUNTER — Ambulatory Visit (INDEPENDENT_AMBULATORY_CARE_PROVIDER_SITE_OTHER): Payer: Managed Care, Other (non HMO) | Admitting: Rehabilitative and Restorative Service Providers"

## 2019-07-27 ENCOUNTER — Other Ambulatory Visit: Payer: Self-pay

## 2019-07-27 ENCOUNTER — Encounter: Payer: Self-pay | Admitting: Rehabilitative and Restorative Service Providers"

## 2019-07-27 DIAGNOSIS — M25661 Stiffness of right knee, not elsewhere classified: Secondary | ICD-10-CM

## 2019-07-27 DIAGNOSIS — R29898 Other symptoms and signs involving the musculoskeletal system: Secondary | ICD-10-CM | POA: Diagnosis not present

## 2019-07-27 DIAGNOSIS — M6281 Muscle weakness (generalized): Secondary | ICD-10-CM | POA: Diagnosis not present

## 2019-07-27 DIAGNOSIS — M5440 Lumbago with sciatica, unspecified side: Secondary | ICD-10-CM

## 2019-07-27 DIAGNOSIS — M25672 Stiffness of left ankle, not elsewhere classified: Secondary | ICD-10-CM | POA: Diagnosis not present

## 2019-07-27 NOTE — Patient Instructions (Addendum)
TENS UNIT: This is helpful for muscle pain and spasm.   Search and Purchase a TENS 7000 2nd edition at www.tenspros.com. It should be less than $30.     TENS unit instructions: Do not shower or bathe with the unit on Turn the unit off before removing electrodes or batteries If the electrodes lose stickiness add a drop of water to the electrodes after they are disconnected from the unit and place on plastic sheet. If you continued to have difficulty, call the TENS unit company to purchase more electrodes. Do not apply lotion on the skin area prior to use. Make sure the skin is clean and dry as this will help prolong the life of the electrodes. After use, always check skin for unusual red areas, rash or other skin difficulties. If there are any skin problems, does not apply electrodes to the same area. Never remove the electrodes from the unit by pulling the wires. Do not use the TENS unit or electrodes other than as directed. Do not change electrode placement without consultating your therapist or physician. Keep 2 fingers with between each electrode.    Supine Hamstring Stretch - 2 x daily - 7 x weekly - 1 sets - 3 reps - 20 seconds hold  Prone Press Up - 2 x daily - 7 x weekly - 1 sets - 10 reps  Hooklying Transversus Abdominis Palpation - 5 x daily - 7 x weekly - 1 sets - 10 reps - 10 sec hold  Sit to Stand - 2 x daily - 7 x weekly - 1 sets - 10 reps  Standing Lumbar Extension - 5 x daily - 7 x weekly - 1 sets - 3 reps - 3 sec hold Patient Education  Hospital doctor

## 2019-07-27 NOTE — Therapy (Signed)
[place], [place], [address] Phone: [phone]   Fax:  [phone]  Physical Therapy Treatment  Patient Details  Name: Johnny Conner MRN: [id number] Date of Birth: 1996/03/20 Referring Provider (PT): Aundria Mems  MD   Encounter Date: 07/27/2019  PT End of Session - 07/27/19 0853    Visit Number  2    Number of Visits  12    Date for PT Re-Evaluation  08/31/19    PT Start Time  5631    PT Stop Time  0943    PT Time Calculation (min)  56 min    Activity Tolerance  Patient tolerated treatment well       Past Medical History:  Diagnosis Date  . Acute IgA nephropathy   . Acute kidney injury (AKI) with acute tubular necrosis (ATN) (HCC)   . CAP (community acquired pneumonia) 03/11/2017  . 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 GERD (gastroesophageal reflux disease)   . 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 Pneumonia ~ 1999  . Pyelonephritis 2012   treated with 3 days of unspecified abx    Past Surgical History:  Procedure Laterality Date  . NO PAST SURGERIES      There were no vitals filed for this visit.  Subjective Assessment - 07/27/19 0853    Subjective  Patient reports that he awoke last night with numbness and tingling in the Lt leg and foot. Resolved with movement. Patient says he feels some better when he has been out of work and lying around at home. Sits in a recliner or propped up on pillows on couch    Currently in Pain?  Yes    Pain Score  3     Pain Location  Back    Pain Orientation  Right;Lower    Pain Descriptors / Indicators  Aching;Discomfort;Tightness    Pain Type  Acute pain                       [facility] Adult PT Treatment/Exercise - 07/27/19 0001      Self-Care   Self-Care  --    initiated back care education - to avoid flexed postures      Lumbar Exercises: Stretches   Passive Hamstring Stretch Limitations  trial of HS stretch supine increased pain - stopped     Standing Extension  3 reps   2-3 sec hold    Press Ups  10 reps   2-3 sec hold      Lumbar Exercises: Seated   Sit to Stand Limitations  attempted sit to stand with core engaged x 3 reps - remained painful       Lumbar Exercises: Supine   AB Set Limitations  4 part core 10 sec hold x 10 reps       Moist Heat Therapy   Number Minutes Moist Heat  15 Minutes    Moist Heat Location  Lumbar Spine      Electrical Stimulation   Electrical Stimulation Location  bilat lumbar spine     Electrical Stimulation Action  IFC    Electrical Stimulation Parameters  to tolerance    Electrical Stimulation Goals  Pain;Tone      Manual Therapy   Manual therapy comments  patient prone  Joint Mobilization  lumbar PA mobs Grade I/II    Soft tissue mobilization  deep tissue work to tolerance Lt > Rt lumbar paraspinals/QL/lats     Myofascial Release  Lt lumbar              PT Education - 07/27/19 0901    Education Details  back care; HEP; TENS    Person(s) Educated  Patient    Methods  Explanation;Demonstration;Tactile cues;Verbal cues;Handout    Comprehension  Verbalized understanding;Returned demonstration;Verbal cues required;Tactile cues required          PT Long Term Goals - 07/20/19 1245      PT LONG TERM GOAL #1   Title  The patient will be indep with HEP.    Time  6    Period  Weeks    Target Date  08/31/19      PT LONG TERM GOAL #2   Title  The patient will report functional limitation < or equal to 32%.    Baseline  57% at eval    Time  6    Period  Weeks    Target Date  08/31/19      PT LONG TERM GOAL #3   Title  The patient will imrpove R hip flexion strength to 5/5.    Time  6    Period  Weeks    Target Date  08/31/19      PT LONG TERM GOAL #4   Title  The patient will  imrpove hamstring length (supine 90/90 hip/knee position) from -40 degrees R to -25 degrees to demo imrpoved flexibility.    Time  6    Period  Weeks    Target Date  08/31/19      PT LONG TERM GOAL #5   Title  The patient will report pain <4/10 with walking.    Baseline  7/10    Time  6    Period  Weeks    Target Date  08/31/19            Plan - 07/27/19 0901    Clinical Impression Statement  Persistent pai nwith no significant change. Patient reports that he rests at home in flexed forward postures in recliner and propped on the sofa. Instructed in neutral spine positions and spine care. patient had difficulty adding exercises. Did add 4 part core. Suggested patient try to purchase TENS for home.    Rehab Potential  Good    PT Frequency  2x / week    PT Duration  6 weeks    PT Treatment/Interventions  ADLs/Self Care Home Management;Gait training;Stair training;Traction;Electrical Stimulation;Moist Heat;Therapeutic exercise;Therapeutic activities;Functional mobility training;Patient/family education;Manual techniques;Taping;Dry needling    PT Next Visit Plan  progress HEP, lumbar stabilization, begin strengthening for standing core/modified squats, flexibility.  Consider other modalities as needed for pain (dry needling, traction, continue with heat/e-stim if needed)    PT Home Exercise Plan  [id number]    Consulted and Agree with Plan of Care  Patient       Patient will benefit from skilled therapeutic intervention in order to improve the following deficits and impairments:     Visit Diagnosis: Muscle weakness (generalized)  Acute right-sided low back pain with sciatica, sciatica laterality unspecified  Other symptoms and signs involving the musculoskeletal system  Stiffness of left ankle, not elsewhere classified  Stiffness of right knee, not elsewhere classified     Problem List Patient Active Problem List   Diagnosis Date Noted  . Right  lumbar radiculopathy  07/13/2019  . Renal cyst, left 12/17/2018  . Cholecystitis 12/13/2018  . Weakness of right lower extremity 11/14/2018  . Rheumatoid factor negative 11/14/2018  . Cyclic citrullinated peptide (CCP) antibody positive 11/08/2018  . Family history of systemic lupus erythematosus 11/04/2018  . Swelling of left ankle joint 11/04/2018  . Swelling of left hand 11/04/2018  . Cervicalgia 11/04/2018  . Polyarthralgia 11/04/2018  . History of primary IgA nephropathy 11/04/2018  . Family history of ankylosing spondylitis 11/04/2018  . History of community acquired pneumonia 03/11/2017    Johnny Conner Nilda Simmer PT, MPH  07/27/2019, 9:33 AM  [facility] [place], [place], [address] Phone: [phone]   Fax:  [phone]  Name: Johnny Conner MRN: [id number] Date of Birth: 1995/07/31

## 2019-08-01 ENCOUNTER — Encounter: Payer: Managed Care, Other (non HMO) | Admitting: Physical Therapy

## 2019-08-01 ENCOUNTER — Telehealth: Payer: Self-pay | Admitting: Physical Therapy

## 2019-08-01 NOTE — Telephone Encounter (Signed)
Patient missed PT appt.  Called patient, but no answer.  Left HIPAA compliant voicemail for him to return call to [facility] at [phone].   Mayer Camel, PTA 08/01/19 9:10 AM

## 2019-08-04 ENCOUNTER — Ambulatory Visit (INDEPENDENT_AMBULATORY_CARE_PROVIDER_SITE_OTHER): Payer: Managed Care, Other (non HMO) | Admitting: Rehabilitative and Restorative Service Providers"

## 2019-08-04 ENCOUNTER — Other Ambulatory Visit: Payer: Self-pay

## 2019-08-04 DIAGNOSIS — M5440 Lumbago with sciatica, unspecified side: Secondary | ICD-10-CM

## 2019-08-04 DIAGNOSIS — R29898 Other symptoms and signs involving the musculoskeletal system: Secondary | ICD-10-CM

## 2019-08-04 DIAGNOSIS — M6281 Muscle weakness (generalized): Secondary | ICD-10-CM | POA: Diagnosis not present

## 2019-08-04 NOTE — Therapy (Signed)
[place], [place], [address] Phone: [phone]   Fax:  [phone]  Physical Therapy Treatment  Patient Details  Name: Johnny Conner MRN: [id number] Date of Birth: [birth year] Referring Provider (PT): Aundria Mems  MD   Encounter Date: 08/04/2019  PT End of Session - 08/04/19 8768    Visit Number  3    Number of Visits  12    Date for PT Re-Evaluation  08/31/19    PT Start Time  0847    PT Stop Time  0930    PT Time Calculation (min)  43 min    Activity Tolerance  Patient tolerated treatment well;Patient limited by pain    Behavior During Therapy  [facility] for tasks assessed/performed       Past Medical History:  Diagnosis Date  . Acute IgA nephropathy   . Acute kidney injury (AKI) with acute tubular necrosis (ATN) (HCC)   . CAP (community acquired pneumonia) 03/11/2017  . 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 GERD (gastroesophageal reflux disease)   . 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 Pneumonia ~ 1999  . Pyelonephritis 2012   treated with 3 days of unspecified abx    Past Surgical History:  Procedure Laterality Date  . NO PAST SURGERIES      There were no vitals filed for this visit.  Subjective Assessment - 08/04/19 0848    Subjective  The patient arrives today with antalgic gait pattern.  He feels pain down the right leg and numbness in the L leg.  He is out of work on short term disability and notes he is sitting and standing intermittently during the day.  He walks dogs for short walks.    Pertinent History  IGA nephropathy    Patient Stated Goals  Reduce leg irritation, signs of soothing pain    Currently in Pain?  Yes    Pain Score  4     Pain Location  Back   lower back down right leg   Pain Orientation   Lower    Pain Descriptors / Indicators  Aching;Sore    Pain Type  Acute pain    Pain Radiating Towards  into R hip and leg    Pain Onset  More than a month ago    Pain Frequency  Constant    Aggravating Factors   "compression" and irritation between shoulder blades    Pain Relieving Factors  unsure                       [facility] Adult PT Treatment/Exercise - 08/04/19 1157      Ambulation/Gait   Ambulation/Gait  Yes    Pre-Gait Activities  resisted R anterior hip to encourage loading R LE and hip initiation during gait.  Patient's gait can improve with repetition, however after rising is it R antalgic.      Exercises   Exercises  Lumbar      Lumbar Exercises: Stretches   Active Hamstring Stretch  Right;Left;2 reps;20 seconds    Passive Hamstring Stretch  Right;Left;2 reps;30 seconds    Passive Hamstring Stretch Limitations  contract/relax     Lower Trunk Rotation  2 reps;30 seconds    Quad Stretch  Right;Left;2 reps;30 seconds    Piriformis Stretch  Right;Left;30  seconds;1 rep    Other Lumbar Stretch Exercise  self mobilization over pool noodle      Lumbar Exercises: Aerobic   Nustep  L4:  x 5 minutes for warm up "I can feel it in my hips"      Lumbar Exercises: Standing   Other Standing Lumbar Exercises  lateral step ups x 10 reps R side with UE support      Lumbar Exercises: Seated   Other Seated Lumbar Exercises  physiodisc with anterior/posterior pelvic tilt with cues tactile at low back to initiate movement through pelvic and low back.      Lumbar Exercises: Prone   Other Prone Lumbar Exercises  prone press ups x 5 reps    Other Prone Lumbar Exercises  prone on elbows with lateral weight shifting; prone alternating UE reaching R and L x 5 reps      Lumbar Exercises: Quadruped   Madcat/Old Horse  5 reps    Madcat/Old Horse Limitations  patient gains most motion through thoracic spine; PT provided tactile cues and then moved to sitting to get better execution  of desired movement      Manual Therapy   Manual Therapy  Joint mobilization;Soft tissue mobilization    Joint Mobilization  Prone PA mobilization grade II-III low thoracic spine; lateral glide painful grade II low thoracic spine worse L>R than R>L; grade III thoracic mob moving patient long sitting to extended over PT fist for mobilization with movement;  sidelying grade II thoracic mobilization with rotation and supine mobilization with movement thoracic spine (PT stabilizing hip and mobilizing with shoulder lift)    Soft tissue mobilization  L thoracic paraspinal STM                  PT Long Term Goals - 07/20/19 1245      PT LONG TERM GOAL #1   Title  The patient will be indep with HEP.    Time  6    Period  Weeks    Target Date  08/31/19      PT LONG TERM GOAL #2   Title  The patient will report functional limitation < or equal to 32%.    Baseline  57% at eval    Time  6    Period  Weeks    Target Date  08/31/19      PT LONG TERM GOAL #3   Title  The patient will imrpove R hip flexion strength to 5/5.    Time  6    Period  Weeks    Target Date  08/31/19      PT LONG TERM GOAL #4   Title  The patient will imrpove hamstring length (supine 90/90 hip/knee position) from -40 degrees R to -25 degrees to demo imrpoved flexibility.    Time  6    Period  Weeks    Target Date  08/31/19      PT LONG TERM GOAL #5   Title  The patient will report pain <4/10 with walking.    Baseline  7/10    Time  6    Period  Weeks    Target Date  08/31/19            Plan - 08/04/19 1246    Clinical Impression Statement  The patient continues with R antalgic gait pattern, and thoracic "compression" sensation + low back pain.  He is limited in exercise tolerance reporting consistent pain of 4-5/10 with all activities.  PT focused manual techniques to thoracic spine mobilizing with movement to reduce tightness.  Plan to continue to develop HEP to patient tolerance.  Emphasized  need to avoid sitting for long periods as this will worsen tightness and pain.    Rehab Potential  Good    PT Frequency  2x / week    PT Duration  6 weeks    PT Treatment/Interventions  ADLs/Self Care Home Management;Gait training;Stair training;Traction;Electrical Stimulation;Moist Heat;Therapeutic exercise;Therapeutic activities;Functional mobility training;Patient/family education;Manual techniques;Taping;Dry needling    PT Next Visit Plan  progress HEP, lumbar stabilization, begin strengthening for standing core/modified squats, flexibility.  Consider other modalities as needed for pain (dry needling, traction, continue with heat/e-stim if needed)    PT Home Exercise Plan  [id number]    Consulted and Agree with Plan of Care  Patient       Patient will benefit from skilled therapeutic intervention in order to improve the following deficits and impairments:  Pain, Hypomobility, Impaired sensation, Impaired flexibility, Postural dysfunction, Decreased strength, Decreased range of motion, Increased fascial restricitons, Increased muscle spasms, Decreased activity tolerance  Visit Diagnosis: Muscle weakness (generalized)  Acute right-sided low back pain with sciatica, sciatica laterality unspecified  Other symptoms and signs involving the musculoskeletal system     Problem List Patient Active Problem List   Diagnosis Date Noted  . Right lumbar radiculopathy 07/13/2019  . Renal cyst, left 12/17/2018  . Cholecystitis 12/13/2018  . Weakness of right lower extremity 11/14/2018  . Rheumatoid factor negative 11/14/2018  . Cyclic citrullinated peptide (CCP) antibody positive 11/08/2018  . Family history of systemic lupus erythematosus 11/04/2018  . Swelling of left ankle joint 11/04/2018  . Swelling of left hand 11/04/2018  . Cervicalgia 11/04/2018  . Polyarthralgia 11/04/2018  . History of primary IgA nephropathy 11/04/2018  . Family history of ankylosing spondylitis 11/04/2018  .  History of community acquired pneumonia 03/11/2017    [place], PT 08/04/2019, 12:47 PM  [facility] [place], [place], [address] Phone: [phone]   Fax:  [phone]  Name: Johnny Conner MRN: [id number] Date of Birth: 28-Jan-1996

## 2019-08-06 ENCOUNTER — Ambulatory Visit (INDEPENDENT_AMBULATORY_CARE_PROVIDER_SITE_OTHER): Payer: Managed Care, Other (non HMO)

## 2019-08-06 ENCOUNTER — Other Ambulatory Visit: Payer: Self-pay

## 2019-08-06 DIAGNOSIS — M5416 Radiculopathy, lumbar region: Secondary | ICD-10-CM

## 2019-08-06 IMAGING — MR MR LUMBAR SPINE W/O CM
4 of 5 series · 19 of 48 positions shown · non-contrast
Comparison: Lumbar radiographs [DATE].

CLINICAL DATA: 24-year-old male with greater than 6 weeks of low
back pain. New symptoms of right L5 radiculopathy. Prior surgery.

EXAM:
MRI LUMBAR SPINE WITHOUT CONTRAST
TECHNIQUE: Multiplanar, multisequence MR imaging of the lumbar spine was
performed. No intravenous contrast was administered.

[Series 3: T1 · sagittal · 4.0mm · 0.51mm/px · 3 of 13 slices shown (1 of 2)]
[im 3/13]
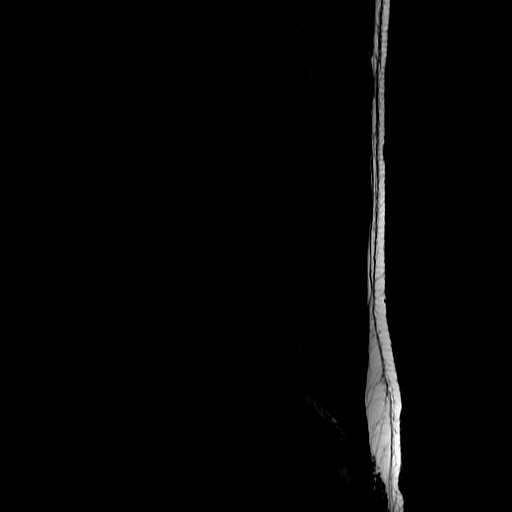
[im 8/13]
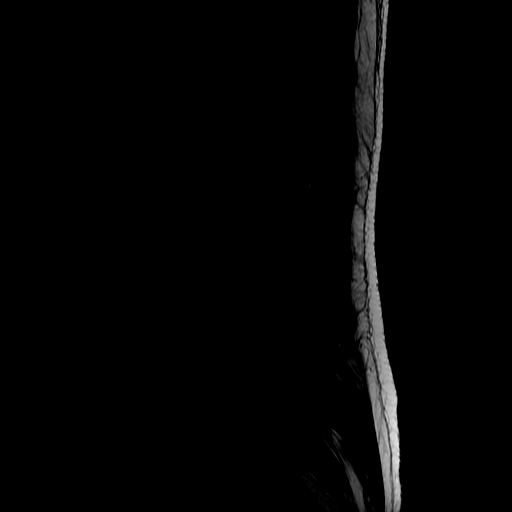
[im 13/13]
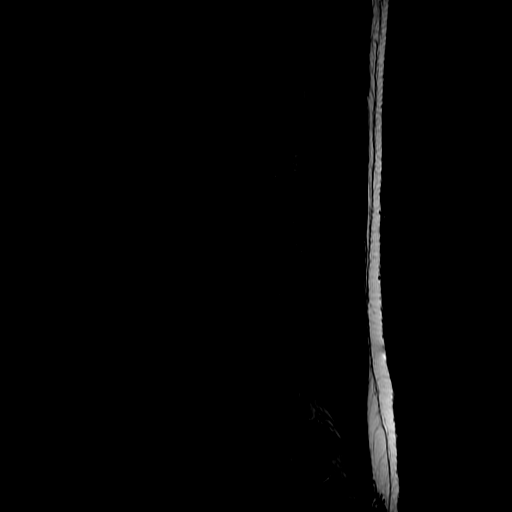

[Series 4: T2 · sagittal · 4.0mm · 0.51mm/px · 5 of 13 slices shown (1 of 2)]
[im 1/13]
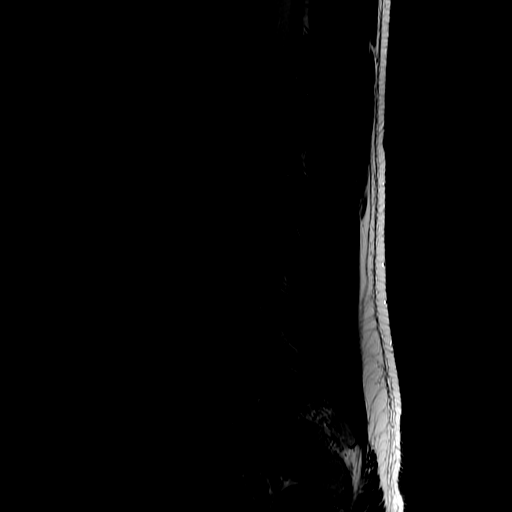
[im 4/13]
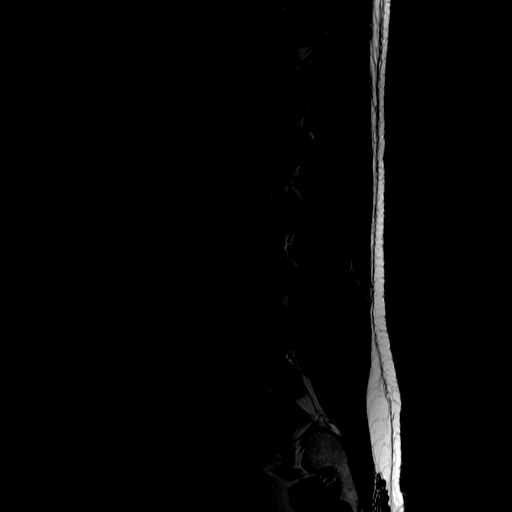
[im 7/13]
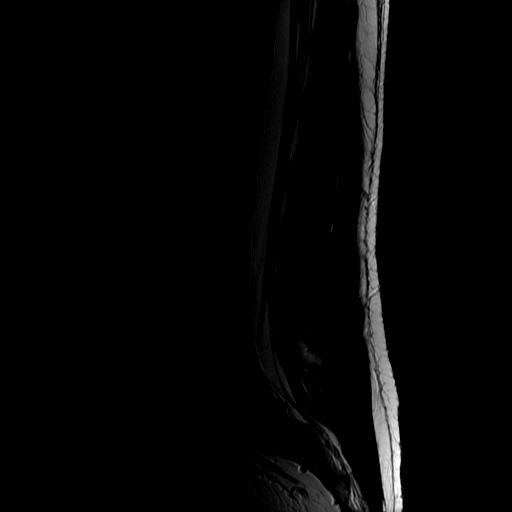
[im 10/13]
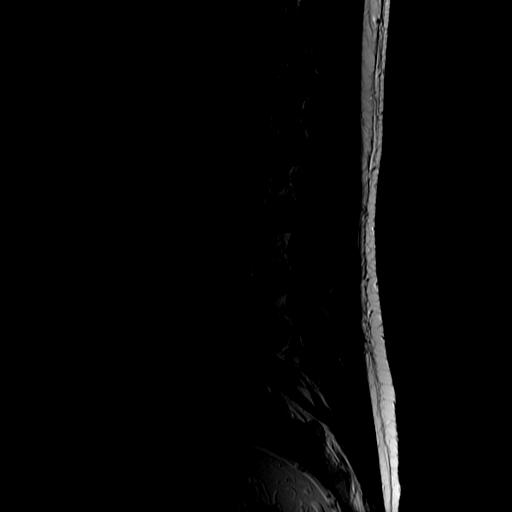
[im 13/13]
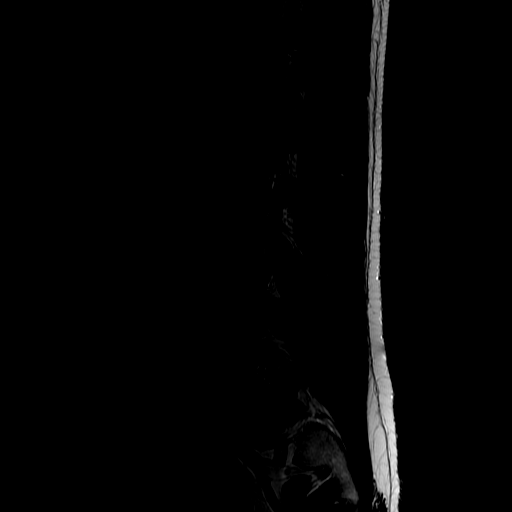

[Series 6: T2 · axial · 4.0mm · 0.39mm/px · z∈[-105,+70]mm · 8 of 38 slices shown (2 of 2)]
[im 3/38]
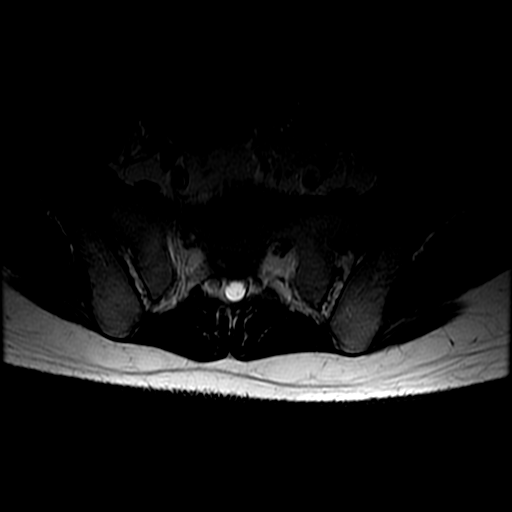
[im 5/38]
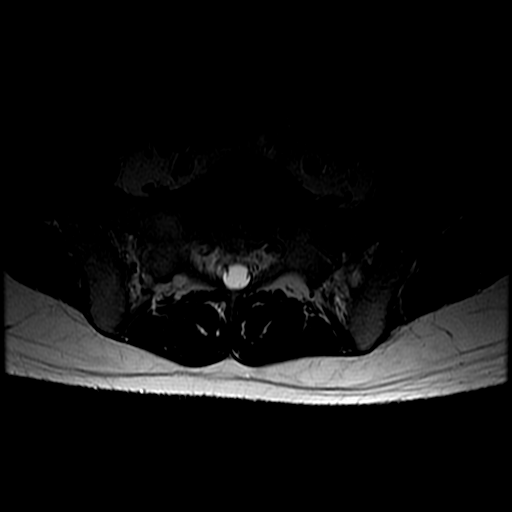
[im 8/38]
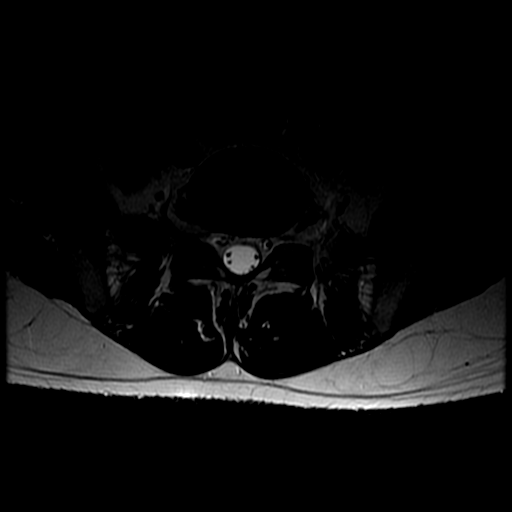
[im 13/38]
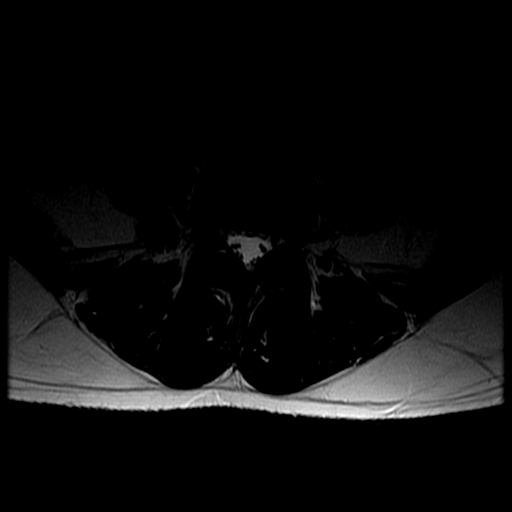
[im 18/38]
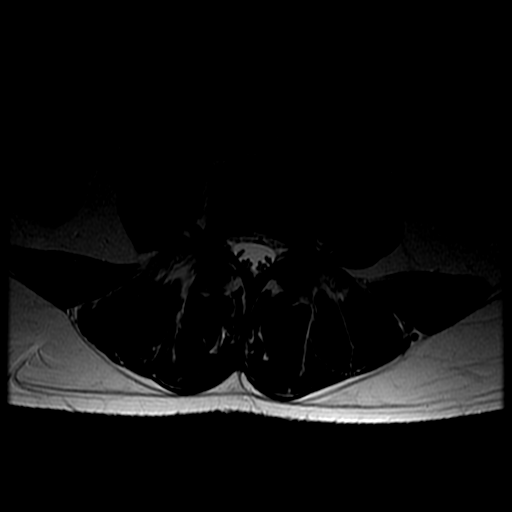
[im 20/38]
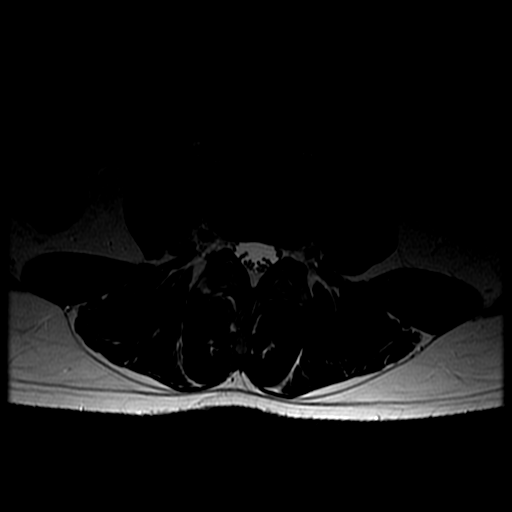
[im 23/38]
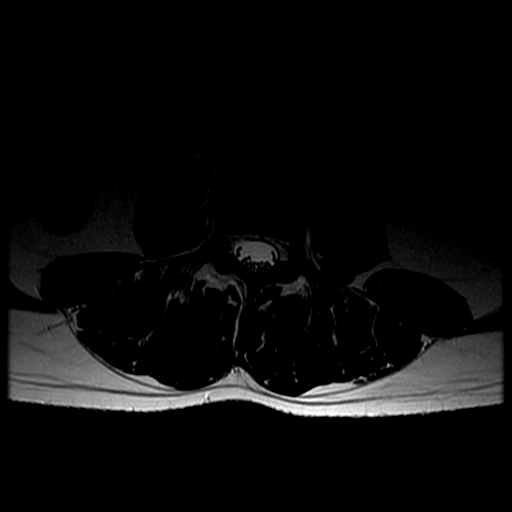
[im 33/38]
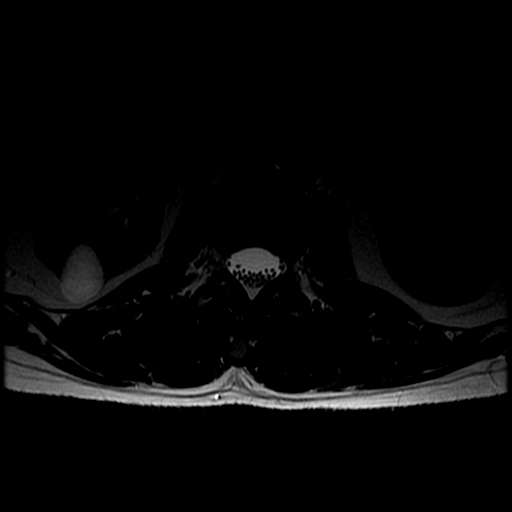

[Series 7: T1 · axial · 4.0mm · 0.39mm/px · z∈[-96,+70]mm · 3 of 38 slices shown (2 of 2)]
[im 5/38]
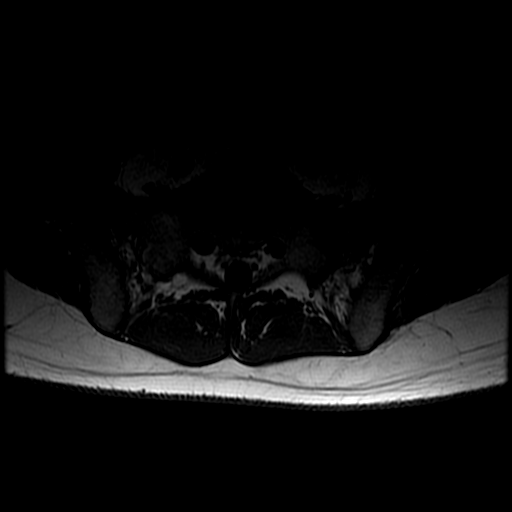
[im 20/38]
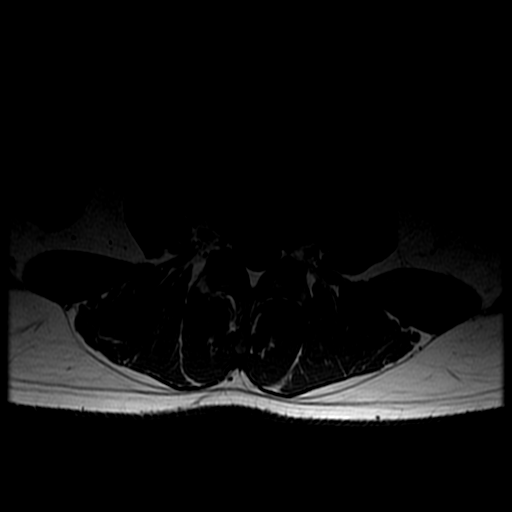
[im 33/38]
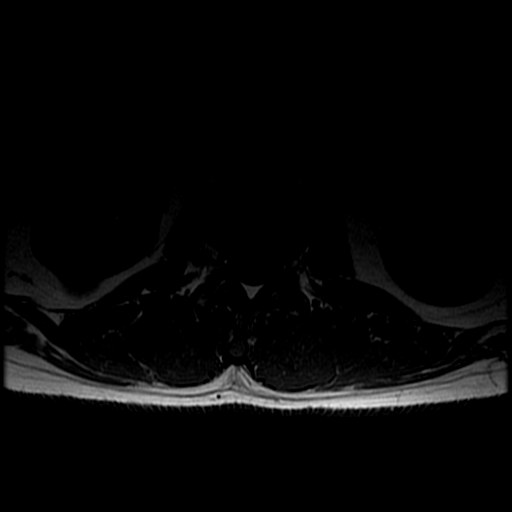

[19 of 48 positions shown; findings below may reference images not displayed]

FINDINGS: Segmentation:  Normal on the comparison radiographs.

Alignment: Mild levoconvex lumbar scoliosis. No spondylolisthesis
and relatively preserved lordosis.

Vertebrae: No marrow edema or evidence of acute osseous abnormality.
Visualized bone marrow signal is within normal limits. Intact
visible sacrum and SI joints.

Conus medullaris and cauda equina: The conus is faintly visible at
the T11-T12 disc level. Capacious spinal canal. Unremarkable cauda
equina nerve roots.

Paraspinal and other soft tissues: Small benign appearing right
renal posterior midpole cyst. Otherwise negative Visualized
abdominal viscera and paraspinal soft tissues.

Disc levels:

T11-T12: Partially visible, negative.

T12-L1:  Negative.

L1-L2:  Negative.

L2-L3:  Negative.

L3-L4:  Negative.

L4-L5: Essentially negative disc. There is mild endplate spurring.
There is mild posterior element hypertrophy. But no convincing
neural impingement.

L5-S1: Negative disc. Mild facet hypertrophy. Possible postoperative
changes to the right lamina at this level (series 6, image 29). But
no epidural space architectural distortion. No stenosis.
IMPRESSION: Minimal lumbar spine degeneration at L4-L5 and L5-S1 but no spinal
stenosis or convincing neural impingement.

## 2019-08-08 ENCOUNTER — Encounter: Payer: Self-pay | Admitting: Physical Therapy

## 2019-08-08 ENCOUNTER — Ambulatory Visit (INDEPENDENT_AMBULATORY_CARE_PROVIDER_SITE_OTHER): Payer: Managed Care, Other (non HMO) | Admitting: Physical Therapy

## 2019-08-08 ENCOUNTER — Other Ambulatory Visit: Payer: Self-pay

## 2019-08-08 DIAGNOSIS — R29898 Other symptoms and signs involving the musculoskeletal system: Secondary | ICD-10-CM

## 2019-08-08 DIAGNOSIS — M5440 Lumbago with sciatica, unspecified side: Secondary | ICD-10-CM

## 2019-08-08 DIAGNOSIS — M6281 Muscle weakness (generalized): Secondary | ICD-10-CM | POA: Diagnosis not present

## 2019-08-08 NOTE — Therapy (Addendum)
[place], [place], [address] Phone: [phone]   Fax:  [phone]  Physical Therapy Treatment and Discharge Summary  Patient Details  Name: Johnny Conner MRN: [id number] Date of Birth: 11/25/1995 Referring Provider (PT): Aundria Mems  MD   Encounter Date: 08/08/2019  PT End of Session - 08/08/19 0847    Visit Number  4    Number of Visits  12    Date for PT Re-Evaluation  08/31/19    PT Start Time  6269    PT Stop Time  0931    PT Time Calculation (min)  44 min       Past Medical History:  Diagnosis Date  . Acute IgA nephropathy   . Acute kidney injury (AKI) with acute tubular necrosis (ATN) (HCC)   . CAP (community acquired pneumonia) 03/11/2017  . 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 GERD (gastroesophageal reflux disease)   . 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 Pneumonia ~ 1999  . Pyelonephritis 2012   treated with 3 days of unspecified abx    Past Surgical History:  Procedure Laterality Date  . NO PAST SURGERIES      There were no vitals filed for this visit.  Subjective Assessment - 08/08/19 0848    Subjective  Pt reports he doesn't have numbness as often in Lt foot, now it is in his Rt foot.  He states his Rt hamstring is still "not letting go" when stretching it at home.  He's able to get through all exercises now. 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    Pertinent History  IGA nephropathy    Patient Stated Goals  Reduce leg irritation, signs of soothing pain    Currently in Pain?  Yes    Pain Score  4     Pain Location  Back    Pain Orientation  Right;Left;Lower    Pain Descriptors / Indicators  Tightness    Aggravating Factors   sitting for long periods of time    Pain Relieving Factors  middle of  day, after moving around a bit.         [facility] PT Assessment - 08/08/19 0001      Assessment   Medical Diagnosis  Right lumbar radiculopathy    Referring Provider (PT)  Aundria Mems  MD    Onset Date/Surgical Date  --   2 months ago   Hand Dominance  Right      Flexibility   Hamstrings  -45 RLE, -25 LLE        [facility] Adult PT Treatment/Exercise - 08/08/19 0001      Lumbar Exercises: Stretches   Passive Hamstring Stretch  Right;Left;20 seconds;3 reps   seated with straight back   Double Knee to Chest Stretch  1 rep;20 seconds    Lower Trunk Rotation  10 seconds;4 reps    Hip Flexor Stretch  Right;Left;3 reps;30 seconds   seated   Standing Extension  3 reps   2-3 sec hold    Quadruped Mid Back Stretch  5 reps   cat cow   Piriformis Stretch  Right;Left;2 reps;20 seconds   seated fig 4, pulling knee towards opp shoulder   Other Lumbar Stretch Exercise  open book x 5 reps each side.  Other Lumbar Stretch Exercise  childs pose x 15 sec x 1 rep, repeated with lateral trunk flexion x 10 sec each side.       Lumbar Exercises: Aerobic   Nustep  L5: 5.5 min.  No report of pain, 60 SPM.       Lumbar Exercises: Seated   Sit to Stand  10 reps   core engaged. focus on form, hip hinge.      Lumbar Exercises: Supine   Bridge  10 reps             PT Education - 08/08/19 1219    Education Details  HEP - hip flexor stretch.    Person(s) Educated  Patient    Methods  Explanation;Demonstration;Handout;Verbal cues    Comprehension  Verbalized understanding;Returned demonstration          PT Long Term Goals - 07/20/19 1245      PT LONG TERM GOAL #1   Title  The patient will be indep with HEP.    Time  6    Period  Weeks    Target Date  08/31/19      PT LONG TERM GOAL #2   Title  The patient will report functional limitation < or equal to 32%.    Baseline  57% at eval    Time  6    Period  Weeks    Target Date  08/31/19      PT LONG TERM GOAL #3    Title  The patient will imrpove R hip flexion strength to 5/5.    Time  6    Period  Weeks    Target Date  08/31/19      PT LONG TERM GOAL #4   Title  The patient will imrpove hamstring length (supine 90/90 hip/knee position) from -40 degrees R to -25 degrees to demo imrpoved flexibility.    Time  6    Period  Weeks    Target Date  08/31/19      PT LONG TERM GOAL #5   Title  The patient will report pain <4/10 with walking.    Baseline  7/10    Time  6    Period  Weeks    Target Date  08/31/19            Plan - 08/08/19 0900    Clinical Impression Statement  Pt observed with antalgic gait upon arrival. After warm up on NuStep, he was observed with improved (short lengths of) gait in between exercises.  Pt shown seated version of exercises (not formally issued as HEP).  Pt demonstrated improved exercise tolerance this visit.  Encouraged continued movement outside of therapy appts to assist in return to functional activities.  Pt reported decreased mid-back tightness at end of session, and no increase to LBP.  Progressing towards goals.    Comorbidities  IGA nephropathy    Rehab Potential  Good    PT Frequency  2x / week    PT Duration  6 weeks    PT Treatment/Interventions  ADLs/Self Care Home Management;Gait training;Stair training;Traction;Electrical Stimulation;Moist Heat;Therapeutic exercise;Therapeutic activities;Functional mobility training;Patient/family education;Manual techniques;Taping;Dry needling    PT Next Visit Plan  continue lumbar stabilization and back care education.    PT Home Exercise Plan  [id number]    Consulted and Agree with Plan of Care  Patient       Patient will benefit from skilled therapeutic intervention in order to improve the following deficits and impairments:  Pain, Hypomobility, Impaired sensation, Impaired flexibility, Postural dysfunction, Decreased strength, Decreased range of motion, Increased fascial restricitons, Increased muscle spasms,  Decreased activity tolerance  Visit Diagnosis: Muscle weakness (generalized)  Acute right-sided low back pain with sciatica, sciatica laterality unspecified  Other symptoms and signs involving the musculoskeletal system  PHYSICAL THERAPY DISCHARGE SUMMARY  Visits from Start of Care: 4  Current functional level related to goals / functional outcomes: Patient did not return since 4th visit.     Remaining deficits: See above note for patient status at time of discharge.   Education / Equipment: Home program, encouragement to increase mobility.  Plan: Patient agrees to discharge.  Patient goals were not met. Patient is being discharged due to not returning since the last visit.  ?????           Thank you for the referral of this patient. Rudell Cobb, MPT  Problem List Patient Active Problem List   Diagnosis Date Noted  . Right lumbar radiculopathy 07/13/2019  . Renal cyst, left 12/17/2018  . Cholecystitis 12/13/2018  . Weakness of right lower extremity 11/14/2018  . Rheumatoid factor negative 11/14/2018  . Cyclic citrullinated peptide (CCP) antibody positive 11/08/2018  . Family history of systemic lupus erythematosus 11/04/2018  . Swelling of left ankle joint 11/04/2018  . Swelling of left hand 11/04/2018  . Cervicalgia 11/04/2018  . Polyarthralgia 11/04/2018  . History of primary IgA nephropathy 11/04/2018  . Family history of ankylosing spondylitis 11/04/2018  . History of community acquired pneumonia 03/11/2017   Kerin Perna, PTA 08/08/19 9:51 AM  [facility] [place], [place], [address] Phone: [phone]   Fax:  [phone]  Name: Johnny Conner MRN: [id number] Date of Birth: [birth year]

## 2019-08-10 ENCOUNTER — Ambulatory Visit: Payer: Managed Care, Other (non HMO) | Admitting: Sports Medicine

## 2019-08-11 ENCOUNTER — Encounter: Payer: Managed Care, Other (non HMO) | Admitting: Physical Therapy

## 2019-08-15 ENCOUNTER — Encounter: Payer: Managed Care, Other (non HMO) | Admitting: Physical Therapy

## 2019-08-18 ENCOUNTER — Encounter: Payer: Managed Care, Other (non HMO) | Admitting: Physical Therapy

## 2019-08-22 ENCOUNTER — Ambulatory Visit: Payer: Managed Care, Other (non HMO) | Admitting: Sports Medicine

## 2019-09-12 ENCOUNTER — Ambulatory Visit (INDEPENDENT_AMBULATORY_CARE_PROVIDER_SITE_OTHER): Payer: Managed Care, Other (non HMO) | Admitting: Sports Medicine

## 2019-09-12 ENCOUNTER — Encounter: Payer: Self-pay | Admitting: Sports Medicine

## 2019-09-12 DIAGNOSIS — M5416 Radiculopathy, lumbar region: Secondary | ICD-10-CM

## 2019-09-12 NOTE — Progress Notes (Signed)
    Procedures performed today:    None.  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 We are treating him for right L5 distribution radiculitis, he responded well to physical therapy and really has no discomfort, we also took him out of work. Today I filled out his disability paperwork. I am going to return him to work for a week at [employer] duty and then full duty afterwards. We can do a virtual visit in 1 month to see how things are going, I did personally review his lumbar spine MRI which did show contact with the right L5 nerve root with the L5-S1 disc, we can add gabapentin if no better followed by an epidural if persistent symptoms.    ___________________________________________ Ihor Austin. Benjamin Stain, M.D., ABFM., CAQSM. Primary Care and Sports Medicine [facility]  Adjunct Instructor of Family Medicine  [employer]

## 2019-09-12 NOTE — Assessment & Plan Note (Signed)
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 We are treating him for right L5 distribution radiculitis, he responded well to physical therapy and really has no discomfort, we also took him out of work. Today I filled out his disability paperwork. I am going to return him to work for a week at [employer] duty and then full duty afterwards. We can do a virtual visit in 1 month to see how things are going, I did personally review his lumbar spine MRI which did show contact with the right L5 nerve root with the L5-S1 disc, we can add gabapentin if no better followed by an epidural if persistent symptoms.

## 2019-10-10 ENCOUNTER — Telehealth (INDEPENDENT_AMBULATORY_CARE_PROVIDER_SITE_OTHER): Payer: Managed Care, Other (non HMO) | Admitting: Sports Medicine

## 2019-10-10 DIAGNOSIS — M5416 Radiculopathy, lumbar region: Secondary | ICD-10-CM | POA: Diagnosis not present

## 2019-10-10 NOTE — Assessment & Plan Note (Signed)
Josh returns, he has a history of IgA nephropathy, we have been treating her for right L5 distribution radiculitis, so far he has responded well to PT, he is for the most part pain-free, he gets a little bit of discomfort when playing disc golf but otherwise feels as though he can live with it. The plan was gabapentin followed by epidural if no better, but it sounds like we can just let this go for now.

## 2019-10-10 NOTE — Progress Notes (Signed)
   Virtual Visit via WebEx/MyChart   I connected with  Johnny Conner  on 10/10/19 via WebEx/MyChart/Doximity Video and verified that I am speaking with the correct person using two identifiers.   I discussed the limitations, risks, security and privacy concerns of performing an evaluation and management service by WebEx/MyChart/Doximity Video, including the higher likelihood of inaccurate diagnosis and treatment, and the availability of in person appointments.  We also discussed the likely need of an additional face to face encounter for complete and high quality delivery of care.  I also discussed with the patient that there may be a patient responsible charge related to this service. The patient expressed understanding and wishes to proceed.  Provider location is either at home or medical facility. Patient location is at their home, different from provider location. People involved in care of the patient during this telehealth encounter were myself, my nurse/medical assistant, and my front office/scheduling team member.  Review of Systems: No fevers, chills, night sweats, weight loss, chest pain, or shortness of breath.   Objective Findings:    General: Speaking full sentences, no audible heavy breathing.  Sounds alert and appropriately interactive.  Appears well.  Face symmetric.  Extraocular movements intact.  Pupils equal and round.  No nasal flaring or accessory muscle use visualized.  Independent interpretation of tests performed by another provider:   None.  Brief History, Exam, Impression, and Recommendations:    Right lumbar radiculopathy Josh returns, he has a history of IgA nephropathy, we have been treating her for right L5 distribution radiculitis, so far he has responded well to PT, he is for the most part pain-free, he gets a little bit of discomfort when playing disc golf but otherwise feels as though he can live with it. The plan was gabapentin followed by epidural if no  better, but it sounds like we can just let this go for now.   I discussed the above assessment and treatment plan with the patient. The patient was provided an opportunity to ask questions and all were answered. The patient agreed with the plan and demonstrated an understanding of the instructions.   The patient was advised to call back or seek an in-person evaluation if the symptoms worsen or if the condition fails to improve as anticipated.   I provided 30 minutes of face to face and non-face-to-face time during this encounter date, time was needed to gather information, review chart, records, communicate/coordinate with staff remotely, as well as complete documentation.   ___________________________________________ Ihor Austin. Benjamin Stain, M.D., ABFM., CAQSM. Primary Care and Sports Medicine [facility]  Adjunct Instructor of Family Medicine  [employer]

## 2023-05-26 ENCOUNTER — Emergency Department (HOSPITAL_COMMUNITY): Payer: Managed Care, Other (non HMO)

## 2023-05-26 ENCOUNTER — Other Ambulatory Visit: Payer: Self-pay

## 2023-05-26 ENCOUNTER — Encounter (HOSPITAL_COMMUNITY): Payer: Self-pay

## 2023-05-26 ENCOUNTER — Emergency Department (HOSPITAL_COMMUNITY)
Admission: EM | Admit: 2023-05-26 | Discharge: 2023-05-26 | Disposition: A | Discharge status: Home / Self Care | Payer: Managed Care, Other (non HMO) | Attending: Emergency Medicine | Admitting: Emergency Medicine

## 2023-05-26 DIAGNOSIS — K2100 Gastro-esophageal reflux disease with esophagitis, without bleeding: Secondary | ICD-10-CM | POA: Diagnosis not present

## 2023-05-26 DIAGNOSIS — R109 Unspecified abdominal pain: Secondary | ICD-10-CM | POA: Diagnosis present

## 2023-05-26 DIAGNOSIS — Z79899 Other long term (current) drug therapy: Secondary | ICD-10-CM | POA: Diagnosis not present

## 2023-05-26 LAB — COMPREHENSIVE METABOLIC PANEL
ALT: 34 U/L (ref 0–44)
AST: 27 U/L (ref 15–41)
Albumin: 4 g/dL (ref 3.5–5.0)
Alkaline Phosphatase: 68 U/L (ref 38–126)
Anion gap: 9 (ref 5–15)
BUN: 9 mg/dL (ref 6–20)
CO2: 26 mmol/L (ref 22–32)
Calcium: 9.7 mg/dL (ref 8.9–10.3)
Chloride: 107 mmol/L (ref 98–111)
Creatinine, Ser: 1.42 mg/dL — ABNORMAL HIGH (ref 0.61–1.24)
GFR, Estimated: >60 mL/min (ref >60)
Glucose, Bld: 97 mg/dL (ref 70–99)
Potassium: 4.7 mmol/L (ref 3.5–5.1)
Sodium: 142 mmol/L (ref 135–145)
Total Bilirubin: 0.7 mg/dL (ref 0.0–1.2)
Total Protein: 7.3 g/dL (ref 6.5–8.1)

## 2023-05-26 LAB — URINALYSIS, ROUTINE W REFLEX MICROSCOPIC
Bilirubin Urine: NEGATIVE
Glucose, UA: NEGATIVE mg/dL
Ketones, ur: NEGATIVE mg/dL
Leukocytes,Ua: NEGATIVE
Nitrite: NEGATIVE
Protein, ur: NEGATIVE mg/dL
Specific Gravity, Urine: 1.023 (ref 1.005–1.030)
pH: 5 (ref 5.0–8.0)

## 2023-05-26 LAB — LIPASE, BLOOD: Lipase: 34 U/L (ref 11–51)

## 2023-05-26 LAB — CBC
HCT: 48.1 % (ref 39.0–52.0)
Hemoglobin: 16.1 g/dL (ref 13.0–17.0)
MCH: 30.6 pg (ref 26.0–34.0)
MCHC: 33.5 g/dL (ref 30.0–36.0)
MCV: 91.3 fL (ref 80.0–100.0)
Platelets: 208 10*3/uL (ref 150–400)
RBC: 5.27 MIL/uL (ref 4.22–5.81)
RDW: 11.9 % (ref 11.5–15.5)
WBC: 7.8 10*3/uL (ref 4.0–10.5)
nRBC: 0 % (ref 0.0–0.2)

## 2023-05-26 MED ORDER — FAMOTIDINE 20 MG PO TABS: 20.0000 mg | ORAL_TABLET | Freq: Two times a day (BID) | ORAL | 0 refills | Status: DC

## 2023-05-26 MED ORDER — IOHEXOL 350 MG/ML SOLN
75.0000 mL | Freq: Once | INTRAVENOUS | Status: AC | PRN
  Administered 2023-05-26: 75 mL via INTRAVENOUS

## 2023-05-26 MED ORDER — SUCRALFATE 1 G PO TABS: 1.0000 g | ORAL_TABLET | Freq: Three times a day (TID) | ORAL | 1 refills | Status: AC

## 2023-05-26 MED ORDER — ALUM & MAG HYDROXIDE-SIMETH 200-200-20 MG/5ML PO SUSP
30.0000 mL | Freq: Once | ORAL | Status: AC
  Administered 2023-05-26: 30 mL via ORAL
  Filled 2023-05-26: qty 30

## 2023-05-26 MED ORDER — FAMOTIDINE 20 MG PO TABS
20.0000 mg | ORAL_TABLET | Freq: Once | ORAL | Status: AC
  Administered 2023-05-26: 20 mg via ORAL
  Filled 2023-05-26: qty 1

## 2023-05-26 MED ORDER — FAMOTIDINE IN NACL 20-0.9 MG/50ML-% IV SOLN
20.0000 mg | Freq: Once | INTRAVENOUS | Status: DC
Start: 2023-05-26 — End: 2023-05-26

## 2023-05-26 MED ORDER — SUCRALFATE 1 G PO TABS
1.0000 g | ORAL_TABLET | Freq: Once | ORAL | Status: AC
  Administered 2023-05-26: 1 g via ORAL
  Filled 2023-05-26: qty 1

## 2023-05-26 NOTE — Discharge Instructions (Addendum)
It was a pleasure taking part in your care.  As we discussed, based on your workup it appears that you have worsening indigestion.  Please continue taking 20 mg of famotidine twice daily.  Please also take Carafate 4 times a day with meals.  Please also continue taking Tums as well as Maalox.  Follow-up with [facility] gastroenterology.  Please call and make an appointment to be seen.  Return to the ED with any new or worsening symptoms.

## 2023-05-26 NOTE — ED Triage Notes (Signed)
Pt arrives via POV. Pt reports over the past month, he has been experiencing nausea and vomiting after eating and drinking. Pt reports worsening epigastric pain. Pt AxOx4. States the reflux is severe at times.

## 2023-05-26 NOTE — ED Provider Triage Note (Signed)
Emergency Medicine Provider Triage Evaluation Note  Johnny Conner , a 28 y.o. male  was evaluated in triage.  Pt complains of intermittent nausea and vomiting for several months.  Has had reflux since he was a teenager, usually takes Pepcid and Tums.  Does not take Prilosec anymore as he has had IgA nephropathy and it seemed to get worse at the time that he was taking Prilosec, so he stopped it just in case.  He has been having worsening epigastric pain and a burning pain in his throat.  Occurs with any kind of food or drink, and laying down.Marland Kitchen  He intermittently has emesis that is discolored, sometimes appears burgundy and other times looks like coffee grounds.  Last had that maybe a week ago.  Review of Systems  Positive: As above Negative: Diarrhea, fever, urinary symptoms  Physical Exam  BP (!) 122/91 (BP Location: Right Arm)   Pulse 95   Temp 97.6 F (36.4 C) (Oral)   Resp 18   SpO2 97%  Gen:   Awake, no distress   Resp:  Normal effort  MSK:   Moves extremities without difficulty  Other:    Medical Decision Making  Medically screening exam initiated at 10:31 AM.  Appropriate orders placed.  SPIKE DESILETS was informed that the remainder of the evaluation will be completed by another provider, this initial triage assessment does not replace that evaluation, and the importance of remaining in the ED until their evaluation is complete.  Labs and imaging ordered. Most recent labs from April 2024 with normal kidney function so will proceed with CT abd/pelvis with contrast depending on repeat creatinine today   Elex Mainwaring T, PA-C 05/26/23 1035

## 2023-05-26 NOTE — ED Provider Notes (Signed)
[facility] [facility]ERGENCY DEPARTMENT AT [facility] Provider Note   CSN: [id number] Arrival date & time: 05/26/23  4401     History  Chief Complaint  Patient presents with   Abdominal Pain   Gastroesophageal Reflux   Nausea   Emesis    Johnny Conner is a 28 y.o. male with medical history of IgA nephropathy, GERD.  Patient presents to ED for evaluation of indigestion.  States over the last 1 month he has had an acute flare of his underlying GERD.  States that in the past he has been on Protonix but reports that he is also gone to renal failure while taking Protonix.  He is unsure if his underlying IgA nephropathy is to blame for his renal failure or if the Protonix is but he is hesitant to begin taking Protonix once more.  He states that he for the last 1 month he has been dealing with this flare and he has been seen at urgent care twice for this.  States that he has been advised to start taking famotidine which she has been doing for the last 1 month.  Reports that the famotidine was providing a lot of relief until the last 1 week.  States he is also taking Maalox and Tums.  Reports that his last urgent care visit he was referred to GI but unable to be seen until March 5.  He is here due to worsening abdominal burning.  Denies nausea, vomiting, diarrhea, blood in his stool, blood in his vomit, chest pain.   Abdominal Pain Associated symptoms: no chest pain, no diarrhea, no nausea, no shortness of breath and no vomiting   Gastroesophageal Reflux Associated symptoms include abdominal pain. Pertinent negatives include no chest pain and no shortness of breath.  Emesis Associated symptoms: abdominal pain   Associated symptoms: no diarrhea        Home Medications Prior to Admission medications   Medication Sig Start Date End Date Taking? Authorizing Provider  famotidine (PEPCID) 20 MG tablet Take 1 tablet (20 mg total) by mouth 2 (two) times daily. 05/26/23  Yes Al Decant, PA-C  sucralfate (CARAFATE) 1 g tablet Take 1 tablet (1 g total) by mouth 4 (four) times daily -  with meals and at bedtime. 05/26/23  Yes Al Decant, PA-C      Allergies    Patient has no known allergies.    Review of Systems   Review of Systems  Respiratory:  Negative for shortness of breath.   Cardiovascular:  Negative for chest pain.  Gastrointestinal:  Positive for abdominal pain. Negative for blood in stool, diarrhea, nausea and vomiting.  All other systems reviewed and are negative.   Physical Exam Updated Vital Signs BP (!) 122/91 (BP Location: Right Arm)   Pulse 95   Temp 97.6 F (36.4 C) (Oral)   Resp 18   SpO2 97%  Physical Exam Vitals and nursing note reviewed.  Constitutional:      General: He is not in acute distress.    Appearance: Normal appearance. He is not ill-appearing, toxic-appearing or diaphoretic.  HENT:     Head: Normocephalic and atraumatic.     Nose: Nose normal.     Mouth/Throat:     Mouth: Mucous membranes are moist.     Pharynx: Oropharynx is clear.  Eyes:     Extraocular Movements: Extraocular movements intact.     Conjunctiva/sclera: Conjunctivae normal.     Pupils: Pupils are equal, round, and  reactive to light.  Cardiovascular:     Rate and Rhythm: Normal rate.  Pulmonary:     Effort: Pulmonary effort is normal.     Breath sounds: Normal breath sounds. No wheezing.  Abdominal:     General: Abdomen is flat.     Tenderness: There is abdominal tenderness. There is no right CVA tenderness or left CVA tenderness.     Comments: Epigastric TTP.  No rebound or guarding.  No overlying skin change.  No CVA tenderness bilaterally.  Musculoskeletal:     Right lower leg: No edema.     Left lower leg: No edema.  Skin:    General: Skin is warm and dry.     Capillary Refill: Capillary refill takes less than 2 seconds.  Neurological:     Mental Status: He is alert and oriented to person, place, and time.     ED Results /  Procedures / Treatments   Labs (all labs ordered are listed, but only abnormal results are displayed) Labs Reviewed  COMPREHENSIVE METABOLIC PANEL - Abnormal; Notable for the following components:      Result Value   Creatinine, Ser 1.42 (*)    All other components within normal limits  URINALYSIS, ROUTINE W REFLEX MICROSCOPIC - Abnormal; Notable for the following components:   Hgb urine dipstick SMALL (*)    Bacteria, UA RARE (*)    All other components within normal limits  LIPASE, BLOOD  CBC    EKG None  Radiology CT ABDOMEN PELVIS W CONTRAST Result Date: 05/26/2023 CLINICAL DATA:  Abdominal pain, acute, nonlocalized. Patient reports nausea and vomiting after eating and drinking over the last month. Worsening epigastric pain. EXAM: CT ABDOMEN AND PELVIS WITH CONTRAST TECHNIQUE: Multidetector CT imaging of the abdomen and pelvis was performed using the standard protocol following bolus administration of intravenous contrast. RADIATION DOSE REDUCTION: This exam was performed according to the departmental dose-optimization program which includes automated exposure control, adjustment of the mA and/or kV according to patient size and/or use of iterative reconstruction technique. CONTRAST:  75mL OMNIPAQUE IOHEXOL 350 MG/ML SOLN COMPARISON:  Noncontrast abdominopelvic CT 02/04/2017. FINDINGS: Lower chest: Clear lung bases. No significant pleural or pericardial effusion. Mildly dilated and fluid-filled distal esophagus with mild esophageal wall thickening. Hepatobiliary: The liver is normal in density without suspicious focal abnormality. Interval cholecystectomy. No biliary dilatation. Pancreas: Unremarkable. No pancreatic ductal dilatation or surrounding inflammatory changes. Spleen: Normal in size without focal abnormality. Adrenals/Urinary Tract: Both adrenal glands appear normal. No evidence of urinary tract calculus, suspicious renal lesion or hydronephrosis. There is a simple cyst in the  lower pole of the right kidney measuring 3.2 cm, for which no specific follow-up imaging is recommended. There is a hyperdense cyst in the lower pole of the left kidney which measures 1.1 cm on image 35/3, minimally enlarged from previous CT at which time it measured 8 mm. No specific follow-up imaging recommended. The bladder appears unremarkable for its degree of distention. Stomach/Bowel: No tear contrast administered. The stomach appears unremarkable for its degree of distension. No evidence of bowel wall thickening, distention or surrounding inflammatory change. The appendix is tentatively identified and appears unremarkable. Mildly prominent stool throughout the colon. Vascular/Lymphatic: There are no enlarged abdominal or pelvic lymph nodes. No significant vascular findings. Reproductive: The prostate gland and seminal vesicles appear unremarkable. Other: No evidence of abdominal wall mass or hernia. No ascites or pneumoperitoneum. Musculoskeletal: No acute or significant osseous findings. IMPRESSION: 1. Mildly dilated and fluid-filled distal esophagus with  mild esophageal wall thickening, suggesting esophagitis and/or gastroesophageal reflux. Consider further evaluation with nonemergent outpatient esophagram. 2. No other acute findings or explanation for the patient's symptoms. 3. Interval cholecystectomy. Electronically Signed   By: Carey Bullocks M.D.   On: 05/26/2023 15:19    Procedures Procedures   Medications Ordered in ED Medications  alum & mag hydroxide-simeth (MAALOX/MYLANTA) 200-200-20 MG/5ML suspension 30 mL (has no administration in time range)  sucralfate (CARAFATE) tablet 1 g (has no administration in time range)  famotidine (PEPCID) tablet 20 mg (has no administration in time range)  iohexol (OMNIPAQUE) 350 MG/ML injection 75 mL (75 mLs Intravenous Contrast Given 05/26/23 1454)    ED Course/ Medical Decision Making/ A&P   Medical Decision Making Risk OTC drugs. Prescription  drug management.    28 year old male with well-known history of GERD presents to the ED for evaluation of indigestion.  Please see HPI for further details.  On examination patient is afebrile and nontachycardic.  His lung sounds are clear bilaterally, he is not hypoxic.  His abdomen has tenderness in the epigastric region however there is no rebound or guarding.  There is no overlying skin change.  There is no CVA tenderness bilaterally.  The patient has no edema to bilateral lower extremities.  Neurological examinations are baseline.  Patient labwork is largely reassuring.  The patient CBC shows no leukocytosis or anemia.  His metabolic panel shows an elevated creatinine 1.42 however this appears to be the patient baseline creatinine.  His urinalysis shows small hemoglobin but he denies dysuria or flank pain.  Lipase WNL.   CT scan shows esophagitis which be consistent with known GERD.  I discussed patient is willingness to go back on Protonix.  He reports that whenever he has taken Protonix in the past it leads to renal failure and he is hesitant to begin taking Protonix today.  Will advise him to continue taking famotidine.  Will also provide him with GI cocktail and Carafate.  Will send the patient home with Carafate.  Will also refer him to [facility] in hopes that he is able to be seen sooner than March.  Patient was given return precautions and he voiced understanding.  Stable to discharge home.   Final Clinical Impression(s) / ED Diagnoses Final diagnoses:  Gastroesophageal reflux disease with esophagitis without hemorrhage    Rx / DC Orders ED Discharge Orders          Ordered    famotidine (PEPCID) 20 MG tablet  2 times daily        05/26/23 1655    sucralfate (CARAFATE) 1 g tablet  3 times daily with meals & bedtime        05/26/23 1655              Al Decant, PA-C 05/26/23 1657    Anders Simmonds T, DO 05/26/23 2301

## 2023-06-03 ENCOUNTER — Encounter: Payer: Self-pay | Admitting: Gastroenterology

## 2023-06-03 ENCOUNTER — Ambulatory Visit: Payer: Managed Care, Other (non HMO) | Admitting: Gastroenterology

## 2023-06-03 VITALS — BP 118/82 | HR 86 | Ht 69.0 in | Wt 170.0 lb

## 2023-06-03 DIAGNOSIS — R1110 Vomiting, unspecified: Secondary | ICD-10-CM | POA: Diagnosis not present

## 2023-06-03 DIAGNOSIS — R1013 Epigastric pain: Secondary | ICD-10-CM

## 2023-06-03 DIAGNOSIS — K219 Gastro-esophageal reflux disease without esophagitis: Secondary | ICD-10-CM | POA: Diagnosis not present

## 2023-06-03 DIAGNOSIS — R1310 Dysphagia, unspecified: Secondary | ICD-10-CM

## 2023-06-03 DIAGNOSIS — Z87448 Personal history of other diseases of urinary system: Secondary | ICD-10-CM

## 2023-06-03 DIAGNOSIS — N02B9 Other recurrent and persistent immunoglobulin A nephropathy: Secondary | ICD-10-CM | POA: Diagnosis not present

## 2023-06-03 NOTE — Patient Instructions (Signed)
  You have been scheduled for an endoscopy. Please follow written instructions given to you at your visit today.  If you use inhalers (even only as needed), please bring them with you on the day of your procedure.  If you take any of the following medications, they will need to be adjusted prior to your procedure:   DO NOT TAKE 7 DAYS PRIOR TO TEST- Trulicity (dulaglutide) Ozempic, Wegovy (semaglutide) Mounjaro (tirzepatide) Bydureon Bcise (exanatide extended release)  DO NOT TAKE 1 DAY PRIOR TO YOUR TEST Rybelsus (semaglutide) Adlyxin (lixisenatide) Victoza (liraglutide) Byetta (exanatide) ___________________________________________________________________________    _______________________________________________________  If your blood pressure at your visit was 140/90 or greater, please contact your primary care physician to follow up on this.  _______________________________________________________  If you are age 35 or older, your body mass index should be between 23-30. Your Body mass index is 25.1 kg/m. If this is out of the aforementioned range listed, please consider follow up with your Primary Care Provider.  If you are age 51 or younger, your body mass index should be between 19-25. Your Body mass index is 25.1 kg/m. If this is out of the aformentioned range listed, please consider follow up with your Primary Care Provider.   ________________________________________________________  The [facility] providers would like to encourage you to use MYCHART to communicate with providers for non-urgent requests or questions.  Due to long hold times on the telephone, sending your provider a message by [facility] may be a faster and more efficient way to get a response.  Please allow 48 business hours for a response.  Please remember that this is for non-urgent requests.  _______________________________________________________ It was a pleasure to see you today!  Thank you for  trusting me with your gastrointestinal care!

## 2023-06-03 NOTE — Progress Notes (Signed)
 Discussed the use of AI scribe software for clinical note transcription with the patient, who gave verbal consent to proceed.  HPI : Johnny Conner is a 28 y.o. male with a history of IgA nephropathy in 2016 who is referred to us  Dr. Mannie of [facility] for further evaluation of epigastric pain and GERD symptoms.  He has been experiencing significant upper GI symptoms for the past month, described as a burning sensation when swallowing food or drink. Initially, the pain was severe enough to cause discomfort even with talking and movement. Although the pain has lessened with medication, it persists with swallowing more substantial foods, leading him to consume softer foods to avoid irritation. He went to the emergency department on January 25 and a CT scan was performed which showed a mildly dilated and fluid-filled esophagus with esophageal wall thickening just above GERD/reflux esophagitis.  He was prescribed famotidine  20 mg BID and sucralfate . He had a negative H. pylori test last month.  He has a long-standing history of nocturnal acid reflux, waking up coughing and sometimes vomiting due to indigestion for approximately thirteen years. He typically uses Pepcid  or Tums as needed, which provides temporary relief. Recently, he has been experiencing episodes of vomiting without nausea, occurring in the morning after eating the previous evening. The vomitus includes undigested food from the night before, sometimes over 12 hours after he last ate. He has never undergone an upper endoscopy.  He has no family history of GI malignancy.  He has a history of IgA nephropathy diagnosed in 2016 in the setting of a GI illness.  He was treated with steroids on 2 separate occasions.  His baseline creatinine since then has been 0.9-1.3.  He has been followed by nephrology.  He associates the use of Prilosec with exacerbations of his kidney issues, having experienced kidney failure while on  the medication. He avoids Prilosec due to these concerns, although he says his nephrologist has not told him that he cannot take PPIs.    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   Pneumonia ~ 1999   Pyelonephritis 2012   treated with 3 days of unspecified abx     Past Surgical History:  Procedure Laterality Date   NO PAST SURGERIES     Family History  Problem Relation Age of Onset   Skin cancer Paternal Grandfather    Arthritis Paternal Grandfather    Glaucoma Paternal Grandfather    Healthy Mother    Arrhythmia Father    Ankylosing spondylitis Brother    Lupus Other    Thyroid disease Paternal Grandmother    Social History   Tobacco Use   Smoking status: Former    Current packs/day: 0.00    Average packs/day: 0.1 packs/day for 5.0 years (0.5 ttl pk-yrs)    Types: Cigarettes    Start date: 03/29/2011    Quit date: 03/28/2016    Years since quitting: 7.1   Smokeless tobacco: Former    Types: Snuff, Chew    Quit date: 06/14/2011  Vaping Use   Vaping status:  Never Used  Substance Use Topics   Alcohol use: Yes    Alcohol/week: 1.0 standard drink of alcohol    Types: 1 Standard drinks or equivalent per week   Drug use: Never   Current Outpatient Medications  Medication Sig Dispense Refill   famotidine  (PEPCID ) 20 MG tablet Take 1 tablet (20 mg total) by mouth 2 (two) times daily. 30 tablet 0   sucralfate  (CARAFATE ) 1 g tablet Take 1 tablet (1 g total) by mouth 4 (four) times daily -  with meals and at bedtime. 120 tablet 1   No current facility-administered  medications for this visit.   No Known Allergies   Review of Systems: All systems reviewed and negative except where noted in HPI.    CT ABDOMEN PELVIS W CONTRAST Result Date: 05/26/2023 CLINICAL DATA:  Abdominal pain, acute, nonlocalized. Patient reports nausea and vomiting after eating and drinking over the last month. Worsening epigastric pain. EXAM: CT ABDOMEN AND PELVIS WITH CONTRAST TECHNIQUE: Multidetector CT imaging of the abdomen and pelvis was performed using the standard protocol following bolus administration of intravenous contrast. RADIATION DOSE REDUCTION: This exam was performed according to the departmental dose-optimization program which includes automated exposure control, adjustment of the mA and/or kV according to patient size and/or use of iterative reconstruction technique. CONTRAST:  75mL OMNIPAQUE  IOHEXOL  350 MG/ML SOLN COMPARISON:  Noncontrast abdominopelvic CT 02/04/2017. FINDINGS: Lower chest: Clear lung bases. No significant pleural or pericardial effusion. Mildly dilated and fluid-filled distal esophagus with mild esophageal wall thickening. Hepatobiliary: The liver is normal in density without suspicious focal abnormality. Interval cholecystectomy. No biliary dilatation. Pancreas: Unremarkable. No pancreatic ductal dilatation or surrounding inflammatory changes. Spleen: Normal in size without focal abnormality. Adrenals/Urinary Tract: Both adrenal glands appear normal. No evidence of urinary tract calculus, suspicious renal lesion or hydronephrosis. There is a simple cyst in the lower pole of the right kidney measuring 3.2 cm, for which no specific follow-up imaging is recommended. There is a hyperdense cyst in the lower pole of the left kidney which measures 1.1 cm on image 35/3, minimally enlarged from previous CT at which time it measured 8 mm. No specific follow-up imaging recommended. The bladder appears unremarkable for its degree of distention. Stomach/Bowel: No tear  contrast administered. The stomach appears unremarkable for its degree of distension. No evidence of bowel wall thickening, distention or surrounding inflammatory change. The appendix is tentatively identified and appears unremarkable. Mildly prominent stool throughout the colon. Vascular/Lymphatic: There are no enlarged abdominal or pelvic lymph nodes. No significant vascular findings. Reproductive: The prostate gland and seminal vesicles appear unremarkable. Other: No evidence of abdominal wall mass or hernia. No ascites or pneumoperitoneum. Musculoskeletal: No acute or significant osseous findings. IMPRESSION: 1. Mildly dilated and fluid-filled distal esophagus with mild esophageal wall thickening, suggesting esophagitis and/or gastroesophageal reflux. Consider further evaluation with nonemergent outpatient esophagram. 2. No other acute findings or explanation for the patient's symptoms. 3. Interval cholecystectomy. Electronically Signed   By: Elsie Perone M.D.   On: 05/26/2023 15:19    Physical Exam: BP 118/82   Pulse 86   Ht 5' 9 (1.753 m)   Wt 170 lb (77.1 kg)   BMI 25.10 kg/m  Constitutional: Pleasant,well-developed, Caucasian male in no acute distress. HEENT: Normocephalic and atraumatic. Conjunctivae are normal. No scleral icterus. Neck supple.  Cardiovascular: Normal rate, regular rhythm.  Pulmonary/chest: Effort normal and breath sounds normal. No wheezing, rales or rhonchi. Abdominal: Soft, nondistended, nontender. Bowel sounds active throughout. There are no masses palpable.  No hepatomegaly. Extremities: no edema Neurological: Alert and oriented to person place and time. Skin: Skin is warm and dry. No rashes noted. Psychiatric: Normal mood and affect. Behavior is normal.  CBC    Component Value Date/Time   WBC 7.8 05/26/2023 1036   RBC 5.27 05/26/2023 1036   HGB 16.1 05/26/2023 1036   HCT 48.1 05/26/2023 1036   PLT 208 05/26/2023 1036   MCV 91.3 05/26/2023 1036   MCH  30.6 05/26/2023 1036   MCHC 33.5 05/26/2023 1036   RDW 11.9 05/26/2023 1036   LYMPHSABS 2,410 11/04/2018 1003   MONOABS 0.9 03/11/2017 0644   EOSABS 496 11/04/2018 1003   BASOSABS 26 11/04/2018 1003    CMP     Component Value Date/Time   NA 142 05/26/2023 1036   NA 142 04/04/2015 1600   K 4.7 05/26/2023 1036   CL 107 05/26/2023 1036   CO2 26 05/26/2023 1036   GLUCOSE 97 05/26/2023 1036   BUN 9 05/26/2023 1036   BUN 17 04/04/2015 1600   CREATININE 1.42 (H) 05/26/2023 1036   CREATININE 1.13 11/04/2018 1003   CALCIUM  9.7 05/26/2023 1036   PROT 7.3 05/26/2023 1036   ALBUMIN 4.0 05/26/2023 1036   AST 27 05/26/2023 1036   ALT 34 05/26/2023 1036   ALKPHOS 68 05/26/2023 1036   BILITOT 0.7 05/26/2023 1036   GFRNONAA >60 05/26/2023 1036   GFRNONAA 90 11/04/2018 1003   GFRAA 105 11/04/2018 1003       Latest Ref Rng & Units 05/26/2023   10:36 AM 11/04/2018   10:03 AM 07/29/2017    2:56 AM  CBC EXTENDED  WBC 4.0 - 10.5 K/uL 7.8  8.7  6.4   RBC 4.22 - 5.81 MIL/uL 5.27  4.80  4.58   Hemoglobin 13.0 - 17.0 g/dL 83.8  85.1  86.0   HCT 39.0 - 52.0 % 48.1  44.0  41.4   Platelets 150 - 400 K/uL 208  235  178   NEUT# 1,500 - 7,800 cells/uL  5,194    Lymph# 850 - 3,900 cells/uL  2,410        ASSESSMENT AND PLAN:  28 year old male with longstanding history of difficult nocturnal GERD symptoms, with recent 1 month exacerbation characterized by dysphagia and odynophagia, partially improved with Pepcid  and Carafate .  He has also been having recent episodes of sudden vomiting without nausea, and these episodes of them characterized by vomiting food consumed 12 hours previously  Gastroesophageal Reflux Disease (GERD) Chronic acid reflux and heartburn for over 13 years, recently exacerbated with severe odynophagia and nocturnal vomiting. Symptoms improved with famotidine  and sucralfate . Previous Prilosec use  thought by patient to have triggered underlying IgA nephropathy. Current symptoms  and  imaging suggestive of esophagitis and stricture. Discussed risks and benefits of continuing famotidine  and sucralfate , and the need for an upper endoscopy to evaluate the esophagus and stomach. Explained the safety and sedation with propofol for the procedure. Potential need for gastric emptying study if vomiting episodes continue. - Continue famotidine  20 mg twice daily - Continue sucralfate  as prescribed - Schedule upper endoscopy - Consider gastric emptying study if delayed gastric emptying symptoms persist  IgA Nephropathy IgA nephropathy with previous kidney failure episodes, potentially exacerbated by Prilosec. Discussed avoiding Prilosec and other proton pump inhibitors due to potential kidney issues, although if significant esophagitis seen and symptoms persist with famotidine , the risk/benefit ratio of PPIs to be considered. - Avoid Prilosec and other proton pump inhibitors for now - Monitor kidney  function as needed  The details, risks (including bleeding, perforation, infection, missed lesions, medication reactions and possible hospitalization or surgery if complications occur), benefits, and alternatives to EGD with possible biopsy and possible dilation were discussed with the patient and he consents to proceed.   Jerlene Rockers E. Stacia, MD [facility]   No ref. provider found

## 2023-06-18 ENCOUNTER — Telehealth: Payer: Self-pay | Admitting: Gastroenterology

## 2023-06-18 NOTE — Telephone Encounter (Signed)
 Patient called requesting to speak with someone stating he is scheduled for 07/14/2023 for an endoscopy. Patient was advised that he is not scheduled and has further questions for appointment. Patient is also requesting paperwork to excuse work.

## 2023-06-22 NOTE — Telephone Encounter (Signed)
 Inbound call from patient requesting a call regarding previous note. Please advise, thank you.

## 2023-06-23 DIAGNOSIS — Z0279 Encounter for issue of other medical certificate: Secondary | ICD-10-CM

## 2023-06-24 NOTE — Telephone Encounter (Signed)
 Patient called to advise Dr. Tomasa Rand that after he submitted the paperwork to HR they realized they provided him with the incorrect paperwork. The patient also stated that because of that HR might request the correct form to be filled out or get a call to our office.

## 2023-06-30 NOTE — Telephone Encounter (Signed)
 Good afternoon Dr. Tomasa Rand,   We received fax information from patient's employment for Short Term Disability. I have placed them on your desk for review.   Thank you.

## 2023-06-30 NOTE — Telephone Encounter (Signed)
 Good afternoon Dr. Tomasa Rand,   Johnny Conner from HR called in stating she faxed over the correct paperwork earlier this morning. Johnny Conner is requesting a call back when the paperwork is filled out, she states it's urgent in order to have the claim open for the patient and to keep out of work. A good call back number for her is [phone] extension [phone].  Thank you

## 2023-06-30 NOTE — Telephone Encounter (Signed)
 Inbound call from patient, requesting a follow up. Advised patient we had not received paperwork yet.

## 2023-07-01 ENCOUNTER — Telehealth: Payer: Self-pay

## 2023-07-01 NOTE — Telephone Encounter (Signed)
 Received a fax from [employer] (Medical request team). Medical request form was completed by Dr. Tomasa Rand and we have attached notes from 04/2823 ED visit and 06/03/23 office visit. No recent labs or imaging ordered by our office. EGD scheduled for 07/28/23.   Forms have been given to [facility] at the 2nd floor receptionist desk. Pt will be contacted to complete paperwork and pay fee for completion of short-term disability paperwork.

## 2023-07-06 NOTE — Telephone Encounter (Signed)
 Patient called stated the paperwork Dr. Tomasa Rand filled out had a date of February and he is asking if it can be fixed initialed by the doctor and faxed back so her can return to work.

## 2023-07-06 NOTE — Telephone Encounter (Signed)
 Form date updated an initialed as requested and faxed back.

## 2023-07-06 NOTE — Telephone Encounter (Signed)
 Pt states the paperwork needs the date changed to stste he may return to work in March. He is going to call back with the fax number.

## 2023-07-06 NOTE — Telephone Encounter (Signed)
 Patient called and provided fax number [phone] Attn: Martie Lee

## 2023-07-28 ENCOUNTER — Encounter: Payer: Self-pay | Admitting: Gastroenterology

## 2023-07-28 ENCOUNTER — Ambulatory Visit (AMBULATORY_SURGERY_CENTER): Payer: Managed Care, Other (non HMO) | Admitting: Gastroenterology

## 2023-07-28 VITALS — BP 117/83 | HR 78 | Temp 97.0°F | Resp 13 | Ht 69.0 in | Wt 170.0 lb

## 2023-07-28 DIAGNOSIS — R1013 Epigastric pain: Secondary | ICD-10-CM

## 2023-07-28 DIAGNOSIS — K317 Polyp of stomach and duodenum: Secondary | ICD-10-CM

## 2023-07-28 DIAGNOSIS — K219 Gastro-esophageal reflux disease without esophagitis: Secondary | ICD-10-CM

## 2023-07-28 DIAGNOSIS — K2289 Other specified disease of esophagus: Secondary | ICD-10-CM

## 2023-07-28 DIAGNOSIS — K449 Diaphragmatic hernia without obstruction or gangrene: Secondary | ICD-10-CM | POA: Diagnosis present

## 2023-07-28 DIAGNOSIS — K2100 Gastro-esophageal reflux disease with esophagitis, without bleeding: Secondary | ICD-10-CM | POA: Diagnosis not present

## 2023-07-28 MED ORDER — SODIUM CHLORIDE 0.9 % IV SOLN: 500.0000 mL | Freq: Once | INTRAVENOUS | Status: DC

## 2023-07-28 MED ORDER — PANTOPRAZOLE SODIUM 40 MG PO TBEC: 40.0000 mg | DELAYED_RELEASE_TABLET | Freq: Two times a day (BID) | ORAL | 3 refills | Status: DC

## 2023-07-28 NOTE — Progress Notes (Signed)
 Called to room to assist during endoscopic procedure.  Patient ID and intended procedure confirmed with present staff. Received instructions for my participation in the procedure from the performing physician.

## 2023-07-28 NOTE — Op Note (Signed)
 [facility] Patient Name: Johnny Conner Procedure Date: 07/28/2023 9:14 AM MRN: [id number] Endoscopist: Lorin Picket E. Tomasa Rand , MD, [id number] Age: 28 Referring MD:  Date of Birth: [birth year] Gender: Male Account #: [id number] Procedure:                Upper GI endoscopy Indications:              Esophageal reflux symptoms that persist despite                            appropriate therapy, Nausea with vomiting Medicines:                Monitored Anesthesia Care Procedure:                Pre-Anesthesia Assessment:                           - Prior to the procedure, a History and Physical                            was performed, and patient medications and                            allergies were reviewed. The patient's tolerance of                            previous anesthesia was also reviewed. The risks                            and benefits of the procedure and the sedation                            options and risks were discussed with the patient.                            All questions were answered, and informed consent                            was obtained. Prior Anticoagulants: The patient has                            taken no anticoagulant or antiplatelet agents. ASA                            Grade Assessment: II - A patient with mild systemic                            disease. After reviewing the risks and benefits,                            the patient was deemed in satisfactory condition to                            undergo the procedure.  After obtaining informed consent, the endoscope was                            passed under direct vision. Throughout the                            procedure, the patient's blood pressure, pulse, and                            oxygen saturations were monitored continuously. The                            GIF [id number] #[id number] was introduced through the                            mouth, and  advanced to the third part of duodenum.                            The upper GI endoscopy was accomplished without                            difficulty. The patient tolerated the procedure                            well. Scope In: Scope Out: Findings:                 The examined portions of the nasopharynx,                            oropharynx and larynx were normal.                           LA Grade D (one or more mucosal breaks involving at                            least 75% of esophageal circumference) esophagitis                            with no bleeding was found at the gastroesophageal                            junction.                           Mucosal changes including longitudinal furrows and                            congestion (edema) were found in the middle third                            of the esophagus and in the lower third of the                            esophagus. Esophageal findings were graded  using                            the Eosinophilic Esophagitis Endoscopic Reference                            Score (EoE-EREFS) as: Edema Grade 1 Present                            (decreased clarity or absence of vascular                            markings), Rings Grade 1 Mild (subtle                            circumferential ridges seen on esophageal                            distension), Exudates Grade 0 None (no white                            lesions seen), Furrows Grade 2 Severe (vertical                            lines with clear depth) and Stricture none (no                            stricture found). Biopsies were obtained from the                            proximal and distal esophagus with cold forceps for                            histology of suspected eosinophilic esophagitis.                            Estimated blood loss was minimal.                           The gastroesophageal flap valve was visualized                             endoscopically and classified as Hill Grade IV (no                            fold, wide open lumen, hiatal hernia present).                           A 5 cm hiatal hernia was present.                           Multiple 3 to 7 mm sessile polyps with no stigmata                            of  recent bleeding were found in the gastric body.                            Two of these polyps were removed with a cold snare.                            Resection and retrieval were complete. Estimated                            blood loss was minimal.                           Normal mucosa was found in the entire examined                            stomach. Biopsies were taken with a cold forceps                            for Helicobacter pylori testing. Estimated blood                            loss was minimal.                           The examined duodenum was normal. Complications:            No immediate complications. Estimated Blood Loss:     Estimated blood loss was minimal. Impression:               - The examined portions of the nasopharynx,                            oropharynx and larynx were normal.                           - LA Grade D reflux esophagitis with no bleeding.                           - Esophageal mucosal changes suspicious for                            eosinophilic esophagitis.                           - Gastroesophageal flap valve classified as Hill                            Grade IV (no fold, wide open lumen, hiatal hernia                            present).                           - 5 cm hiatal hernia.                           -  Multiple gastric polyps. Resected and retrieved.                            These were consistent with fundic gland polyps.                           - Normal mucosa was found in the entire stomach.                            Biopsied.                           - Normal examined duodenum.                           - Biopsies were  taken with a cold forceps for                            evaluation of eosinophilic esophagitis. Recommendation:           - Patient has a contact number available for                            emergencies. The signs and symptoms of potential                            delayed complications were discussed with the                            patient. Return to normal activities tomorrow.                            Written discharge instructions were provided to the                            patient.                           - Resume previous diet.                           - Continue present medications.                           - Await pathology results.                           - Use Protonix (pantoprazole) 40 mg PO BID for 8                            weeks to heal reflux esophagitis.                           - Repeat upper endoscopy in 8 weeks to check  healing.                           - Consider surgical consultation for hiatal hernia                            repair and fundoplication. Zaydenn Balaguer E. Tomasa Rand, MD 07/28/2023 9:46:05 AM This report has been signed electronically.

## 2023-07-28 NOTE — Progress Notes (Signed)
 [facility] History and Physical   Primary Care Physician:  Patient, No Pcp Per   Reason for Procedure:   GERD symptoms   Plan:    EGD     HPI: Johnny Conner is a 28 y.o. male undergoing EGD to evaluate persistent GERD symptoms and recent severe exacerbation with associated odynophagia.  He also has frequent nausea/vomiting.  Symptoms partially improved with BID Pepcid and carafate.  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 Authorizing Provider  famotidine (PEPCID) 20 MG tablet Take 1 tablet (20 mg total) by mouth 2 (two) times daily. 05/26/23  Yes Al Decant, PA-C  sucralfate (CARAFATE) 1 g tablet Take 1 tablet (1 g total) by mouth 4 (four) times daily -  with meals and at bedtime. 05/26/23  Yes Al Decant, PA-C    Current Outpatient Medications  Medication Sig Dispense Refill   famotidine (PEPCID) 20 MG tablet Take 1 tablet (20 mg total) by mouth 2 (two) times daily. 30 tablet 0   sucralfate (CARAFATE) 1 g tablet Take 1 tablet (1 g total) by mouth 4 (four) times daily -  with meals and at bedtime. 120 tablet 1   Current Facility-Administered Medications  Medication Dose Route Frequency Provider Last Rate Last Admin   0.9 %  sodium chloride infusion   500 mL Intravenous Once Jenel Lucks, MD        Allergies as of 07/28/2023   (No Known Allergies)    Family History  Problem Relation Age of Onset   Healthy Mother    Arrhythmia Father    Ankylosing spondylitis Brother    Thyroid disease Paternal Grandmother    Skin cancer Paternal Grandfather    Arthritis Paternal Grandfather    Glaucoma Paternal Grandfather    Lupus Other    Liver disease Neg Hx    Colon cancer Neg Hx    Esophageal cancer Neg Hx    Stomach cancer Neg Hx     Social History   Socioeconomic History   Marital status: Married    Spouse name: Not on file   Number of children: 0   Years of education: Not on file   Highest education level: Not on file  Occupational History   Occupation: [employer]   Occupation: Copywriter, advertising  Tobacco Use   Smoking status: Former    Current packs/day: 0.00    Average packs/day: 0.1 packs/day for 5.0 years (0.5 ttl pk-yrs)    Types: Cigarettes    Start date: 03/29/2011    Quit date: 03/28/2016    Years since quitting: 7.3   Smokeless tobacco: Former    Types: Snuff, Chew    Quit date: 06/14/2011  Vaping Use  Vaping status: Never Used  Substance and Sexual Activity   Alcohol use: Yes    Alcohol/week: 1.0 standard drink of alcohol    Types: 1 Standard drinks or equivalent per week    Comment: rarely   Drug use: Never   Sexual activity: Yes    Birth control/protection: Condom  Other Topics Concern   Not on file  Social History Narrative   Not on file   Social Drivers of Health   Financial Resource Strain: Not on file  Food Insecurity: Not on file  Transportation Needs: Not on file  Physical Activity: Not on file  Stress: Not on file  Social Connections: Unknown (09/09/2021)   Received [facility]   Social Network    Social Network: Not on file  Intimate Partner Violence: Not At Risk (07/30/2022)   Received from [facility]   HITS    Over the last 12 months how often did your partner physically  hurt you?: Never    Over the last 12 months how often did your partner insult you or talk down to you?: Never    Over the last 12 months how often did your partner threaten you with physical harm?: Never    Over the last 12 months how often did your partner scream or curse at you?: Never    Review of Systems:  All other review of systems negative except as mentioned in the HPI.  Physical Exam: Vital signs BP 120/75   Pulse 76   Temp (!) 97 F (36.1 C)   Ht 5\' 9"  (1.753 m)   Wt 170 lb (77.1 kg)   SpO2 100%   BMI 25.10 kg/m   General:   Alert,  Well-developed, well-nourished, pleasant and cooperative in NAD Airway:  Mallampati 1 Lungs:  Clear throughout to auscultation.   Heart:  Regular rate and rhythm; no murmurs, clicks, rubs,  or gallops. Abdomen:  Soft, nontender and nondistended. Normal bowel sounds.   Neuro/Psych:  Normal mood and affect. A and O x 3   Sidharth Leverette E. Tomasa Rand, MD [facility]

## 2023-07-28 NOTE — Progress Notes (Signed)
 Report to PACU, RN, vss, BBS= Clear.

## 2023-07-28 NOTE — Patient Instructions (Signed)

## 2023-07-29 ENCOUNTER — Telehealth: Payer: Self-pay | Admitting: *Deleted

## 2023-07-29 NOTE — Telephone Encounter (Signed)
  Follow up Call-     07/28/2023    8:32 AM  Call back number  Post procedure Call Back phone  # [phone]  Permission to leave phone message Yes     Patient questions:  Do you have a fever, pain , or abdominal swelling? No. Pain Score  0 *  Have you tolerated food without any problems? Yes.    Have you been able to return to your normal activities? Yes.    Do you have any questions about your discharge instructions: Diet   No. Medications  No. Follow up visit  No.  Do you have questions or concerns about your Care? No.  Actions: * If pain score is 4 or above: No action needed, pain <4.

## 2023-07-30 LAB — SURGICAL PATHOLOGY

## 2023-08-04 ENCOUNTER — Encounter: Payer: Self-pay | Admitting: Gastroenterology

## 2023-08-04 NOTE — Progress Notes (Signed)
 Johnny Conner,  The polyps resected from your stomach were benign fundic gland polyps.  There was no evidence of infection with Helicobacter pylori or intestinal metaplasia/dysplasia.  These types of polyps are often secondary to acid suppression therapy and no specific follow-up required for these small, benign polyps.  The biopsies of your stomach were unremarkable.  There is no evidence of H. pylori infection.  The biopsies from the esophagus on your recent upper endoscopy were notable for elevated eosinophils (inflammatory cells).  The biopsy results and images during your endoscopy are consistent with Eosinophilic Esophagitis (EoE).  This is an inflammatory condition of your esophagus which is typically triggered by ingestion of certain foods (allergens) and can result in difficulty swallowing.  This can be managed through a combination of medications with or without the need for repeat endoscopy with dilation (stretching) of the esophagus as needed.  You also had evidence of reflux esophagitis.  This is damage to the esophagus from stomach acid coming up into the esophagus.  As discussed, you have a hiatal hernia which is an anatomic predisposition for acid reflux.  Although we had discussed potentially referring you to a surgeon to discuss hiatal hernia repair and fundoplication, I think it is very unlikely that they would want to do the surgery in the setting of active eosinophilic esophagitis.  Often times eosinophilic esophagitis will respond just with acid reducing medicine.  Therefore, I recommend you continue taking the Protonix twice daily as prescribed and plan on repeating the upper endoscopy as scheduled.  At that time, we will repeat esophageal biopsies to reassess esophageal inflammation.  We can reconsider surgery referral based on the results of the repeat upper endoscopy.

## 2023-09-08 ENCOUNTER — Encounter: Payer: Self-pay | Admitting: Gastroenterology

## 2023-09-23 ENCOUNTER — Ambulatory Visit: Admitting: Gastroenterology

## 2023-09-23 ENCOUNTER — Encounter: Payer: Self-pay | Admitting: Gastroenterology

## 2023-09-23 VITALS — BP 93/63 | HR 69 | Temp 98.1°F | Resp 16 | Ht 69.0 in | Wt 170.0 lb

## 2023-09-23 DIAGNOSIS — K317 Polyp of stomach and duodenum: Secondary | ICD-10-CM

## 2023-09-23 DIAGNOSIS — K2289 Other specified disease of esophagus: Secondary | ICD-10-CM | POA: Diagnosis not present

## 2023-09-23 DIAGNOSIS — K449 Diaphragmatic hernia without obstruction or gangrene: Secondary | ICD-10-CM | POA: Diagnosis not present

## 2023-09-23 DIAGNOSIS — R1013 Epigastric pain: Secondary | ICD-10-CM | POA: Diagnosis not present

## 2023-09-23 DIAGNOSIS — K200 Eosinophilic esophagitis: Secondary | ICD-10-CM

## 2023-09-23 DIAGNOSIS — K219 Gastro-esophageal reflux disease without esophagitis: Secondary | ICD-10-CM | POA: Diagnosis not present

## 2023-09-23 DIAGNOSIS — K2100 Gastro-esophageal reflux disease with esophagitis, without bleeding: Secondary | ICD-10-CM

## 2023-09-23 MED ORDER — SODIUM CHLORIDE 0.9 % IV SOLN
500.0000 mL | Freq: Once | INTRAVENOUS | Status: DC
Start: 2023-09-23 — End: 2023-09-23

## 2023-09-23 MED ORDER — RABEPRAZOLE SODIUM 20 MG PO TBEC
40.0000 mg | DELAYED_RELEASE_TABLET | Freq: Two times a day (BID) | ORAL | 0 refills | Status: AC
Start: 2023-09-23 — End: 2023-12-28

## 2023-09-23 NOTE — Progress Notes (Signed)
 [facility]  Pt's states no medical or surgical changes since previsit or office visit.  History reviewed.

## 2023-09-23 NOTE — Progress Notes (Signed)
 Pt sedate, gd SR's, VSS, report to RN

## 2023-09-23 NOTE — Progress Notes (Signed)
 [facility] History and Physical   Primary Care Physician:  Patient, No Pcp Per   Reason for Procedure:   Follow up reflux esophagitis/eosinophilic esophagitis  Plan:    EGD     HPI: Johnny Conner is a 28 y.o. male with history of IgA nephropathy undergoing repeat EGD after an EGD in April showed  LA Grade D esophagitis, 5 cm HH, and endoscopic and histologic features of EoE.  He was started on Protonix  40mg  BID.  His symptoms have improved significantly since then, with occasional breakthrough GERD symptoms and no dysphagia.    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 Authorizing Provider  pantoprazole  (PROTONIX ) 40 MG tablet Take 1 tablet (40 mg total) by mouth 2 (two) times daily. 07/28/23  Yes Elois Hair, MD  sucralfate  (CARAFATE ) 1 g tablet Take 1 tablet (1 g total) by mouth 4 (four) times daily -  with meals and at bedtime. 05/26/23   Adel Aden, PA-C    Current Outpatient Medications  Medication Sig Dispense Refill   pantoprazole  (PROTONIX ) 40 MG tablet Take 1 tablet (40 mg total) by mouth 2 (two) times daily. 180 tablet 3   sucralfate  (CARAFATE ) 1 g tablet Take 1 tablet (1 g total) by mouth 4 (four) times daily -  with meals and at bedtime. 120  tablet 1   Current Facility-Administered Medications  Medication Dose Route Frequency Provider Last Rate Last Admin   0.9 %  sodium chloride  infusion  500 mL Intravenous Once Elois Hair, MD        Allergies as of 09/23/2023   (No Known Allergies)    Family History  Problem Relation Age of Onset   Healthy Mother    Arrhythmia Father    Ankylosing spondylitis Brother    Thyroid disease Paternal Grandmother    Skin cancer Paternal Grandfather    Arthritis Paternal Grandfather    Glaucoma Paternal Grandfather    Lupus Other    Liver disease Neg Hx    Colon cancer Neg Hx    Esophageal cancer Neg Hx    Stomach cancer Neg Hx     Social History   Socioeconomic History   Marital status: Married    Spouse name: Not on file   Number of children: 0   Years of education: Not on file   Highest education level: Not on file  Occupational History   Occupation: [employer]   Occupation: Copywriter, advertising  Tobacco Use   Smoking status: Former    Current packs/day: 0.00    Average packs/day: 0.1 packs/day for 5.0 years (0.5 ttl pk-yrs)    Types: Cigarettes    Start date: 03/29/2011    Quit date: 03/28/2016  Years since quitting: 7.4   Smokeless tobacco: Former    Types: Snuff, Cassie Click    Quit date: 06/14/2011  Vaping Use   Vaping status: Never Used  Substance and Sexual Activity   Alcohol use: Yes    Alcohol/week: 1.0 standard drink of alcohol    Types: 1 Standard drinks or equivalent per week    Comment: rarely   Drug use: Never   Sexual activity: Yes    Birth control/protection: Condom  Other Topics Concern   Not on file  Social History Narrative   Not on file   Social Drivers of Health   Financial Resource Strain: Not on file  Food Insecurity: Not on file  Transportation Needs: Not on file  Physical Activity: Not on file  Stress: Not on file  Social Connections: Unknown (09/09/2021)   Received from [facility]   Social Network    Social Network: Not on file   Intimate Partner Violence: Not At Risk (07/30/2022)   Received from [facility]   HITS    Over the last 12 months how often did your partner physically hurt you?: Never    Over the last 12 months how often did your partner insult you or talk down to you?: Never    Over the last 12 months how often did your partner threaten you with physical harm?: Never    Over the last 12 months how often did your partner scream or curse at you?: Never    Review of Systems:  All other review of systems negative except as mentioned in the HPI.  Physical Exam: Vital signs BP 114/80 (BP Location: Right Arm, Patient Position: Sitting, Cuff Size: Normal)   Pulse 84   Temp 98.1 F (36.7 C) (Temporal)   Ht 5\' 9"  (1.753 m)   Wt 170 lb (77.1 kg)   SpO2 96%   BMI 25.10 kg/m   General:   Alert,  Well-developed, well-nourished, pleasant and cooperative in NAD Airway:  Mallampati 2 Lungs:  Clear throughout to auscultation.   Heart:  Regular rate and rhythm; no murmurs, clicks, rubs,  or gallops. Abdomen:  Soft, nontender and nondistended. Normal bowel sounds.   Neuro/Psych:  Normal mood and affect. A and O x 3   Janifer Gieselman E. Cherryl Corona, MD [facility]

## 2023-09-23 NOTE — Op Note (Signed)
 [facility] Patient Name: Johnny Conner Procedure Date: 09/23/2023 9:43 AM MRN: [id number] Endoscopist: Geralyn Knee E. Cherryl Corona , MD, [id number] Age: 28 Referring MD:  Date of Birth: [birth year] Gender: Male Account #: [id number] Procedure:                Upper GI endoscopy Indications:              Follow-up of reflux esophagitis, Suspected                            eosinophilic esophagitis; LA Grade D esophagitis                            and EoE features seen in April. Started on BID                            Protonix  with significant improvement/resolution of                            symptoms (currently no dysphagia, occasional                            breakthrough reflux symptoms). Medicines:                Monitored Anesthesia Care Procedure:                Pre-Anesthesia Assessment:                           - Prior to the procedure, a History and Physical                            was performed, and patient medications and                            allergies were reviewed. The patient's tolerance of                            previous anesthesia was also reviewed. The risks                            and benefits of the procedure and the sedation                            options and risks were discussed with the patient.                            All questions were answered, and informed consent                            was obtained. Prior Anticoagulants: The patient has                            taken no anticoagulant or antiplatelet agents. ASA  Grade Assessment: II - A patient with mild systemic                            disease. After reviewing the risks and benefits,                            the patient was deemed in satisfactory condition to                            undergo the procedure.                           After obtaining informed consent, the endoscope was                            passed under direct vision.  Throughout the                            procedure, the patient's blood pressure, pulse, and                            oxygen saturations were monitored continuously. The                            Olympus Scope [id number] was introduced through the                            mouth, and advanced to the second part of duodenum.                            The upper GI endoscopy was accomplished without                            difficulty. The patient tolerated the procedure                            well. Scope In: Scope Out: Findings:                 The examined portions of the nasopharynx,                            oropharynx and larynx were normal.                           A single area of ectopic gastric mucosa was found                            in the proximal esophagus.                           LA Grade D (one or more mucosal breaks involving at                            least 75% of esophageal circumference)  esophagitis                            with no bleeding was found in the lower third of                            the esophagus.                           Mucosal changes including ringed esophagus and                            longitudinal furrows were found in the middle third                            of the esophagus and in the lower third of the                            esophagus. The proximal 5 cm of the esophagus                            appeared normal. Esophageal findings were graded                            using the Eosinophilic Esophagitis Endoscopic                            Reference Score (EoE-EREFS) as: Edema Grade 1                            Present (decreased clarity or absence of vascular                            markings), Rings Grade 1 Mild (subtle                            circumferential ridges seen on esophageal                            distension), Exudates Grade 0 None (no white                            lesions seen),  Furrows Grade 1 Mild (vertical lines                            without visible depth) and Stricture none (no                            stricture found). Biopsies were obtained from the                            proximal and distal esophagus with cold forceps for  histology of suspected eosinophilic esophagitis.                            Estimated blood loss was minimal.                           A 6 cm hiatal hernia was present.                           Multiple small sessile polyps were found in the                            gastric body.                           The exam of the stomach was otherwise normal.                           The examined duodenum was normal. Complications:            No immediate complications. Estimated Blood Loss:     Estimated blood loss was minimal. Impression:               - The examined portions of the nasopharynx,                            oropharynx and larynx were normal.                           - LA Grade D reflux esophagitis with no bleeding.                            Although symptoms much improved, endoscopic                            minimally changed from previous.                           - Esophageal mucosal changes suggestive of                            eosinophilic esophagitis vs reflux esophagitis.                           - 6 cm hiatal hernia.                           - Multiple gastric polyps.                           - Normal examined duodenum.                           - Biopsies were taken with a cold forceps for                            evaluation of eosinophilic esophagitis. Recommendation:           -  Patient has a contact number available for                            emergencies. The signs and symptoms of potential                            delayed complications were discussed with the                            patient. Return to normal activities tomorrow.                             Written discharge instructions were provided to the                            patient.                           - Resume previous diet.                           - Use Aciphex  (rabeprazole ) 40 mg PO BID for 12                            weeks. Stop pantoprazole .                           - Repeat upper endoscopy in 12 weeks to check                            healing.                           - Follow anti-reflux diet/behavioral modifications                            (see handout). Doristine Shehan E. Cherryl Corona, MD 09/23/2023 10:08:03 AM This report has been signed electronically.

## 2023-09-23 NOTE — Patient Instructions (Addendum)
 Resume previous diet Use Aciphex (rabeprazole) 40mg  by mouth twice daily for 12 weeks. Stop Pantoprazole . Repeat upper endoscopy in 12 weeks to check healing Follow anti- reflux diet/behavioral modifications (see handout) See handouts for polyps, esophagitis, hiatal hernia and GERDYOU HAD AN ENDOSCOPIC PROCEDURE TODAY AT [facility]:   Refer to the procedure report that was given to you for any specific questions about what was found during the examination.  If the procedure report does not answer your questions, please call your gastroenterologist to clarify.  If you requested that your care partner not be given the details of your procedure findings, then the procedure report has been included in a sealed envelope for you to review at your convenience later.  YOU SHOULD EXPECT: Some feelings of bloating in the abdomen. Passage of more gas than usual.  Walking can help get rid of the air that was put into your GI tract during the procedure and reduce the bloating. If you had a lower endoscopy (such as a colonoscopy or flexible sigmoidoscopy) you may notice spotting of blood in your stool or on the toilet paper. If you underwent a bowel prep for your procedure, you may not have a normal bowel movement for a few days.  Please Note:  You might notice some irritation and congestion in your nose or some drainage.  This is from the oxygen used during your procedure.  There is no need for concern and it should clear up in a day or so.  SYMPTOMS TO REPORT IMMEDIATELY:  Following upper endoscopy (EGD)  Vomiting of blood or coffee ground material  New chest pain or pain under the shoulder blades  Painful or persistently difficult swallowing  New shortness of breath  Fever of 100F or higher  Black, tarry-looking stools  For urgent or emergent issues, a gastroenterologist can be reached at any hour by calling [phone]. Do not use MyChart messaging for urgent concerns.   DIET:  We  do recommend a small meal at first, but then you may proceed to your regular diet.  Drink plenty of fluids but you should avoid alcoholic beverages for 24 hours.  ACTIVITY:  You should plan to take it easy for the rest of today and you should NOT DRIVE or use heavy machinery until tomorrow (because of the sedation medicines used during the test).    FOLLOW UP: Our staff will call the number listed on your records the next business day following your procedure.  We will call around 7:15- 8:00 am to check on you and address any questions or concerns that you may have regarding the information given to you following your procedure. If we do not reach you, we will leave a message.     If any biopsies were taken you will be contacted by phone or by letter within the next 1-3 weeks.  Please call us  at [phone] if you have not heard about the biopsies in 3 weeks.   SIGNATURES/CONFIDENTIALITY: You and/or your care partner have signed paperwork which will be entered into your electronic medical record.  These signatures attest to the fact that that the information above on your After Visit Summary has been reviewed and is understood.  Full responsibility of the confidentiality of this discharge information lies with you and/or your care-partner.

## 2023-09-23 NOTE — Progress Notes (Signed)
 Called to room to assist during endoscopic procedure.  Patient ID and intended procedure confirmed with present staff. Received instructions for my participation in the procedure from the performing physician.

## 2023-09-24 ENCOUNTER — Telehealth: Payer: Self-pay | Admitting: *Deleted

## 2023-09-24 NOTE — Telephone Encounter (Signed)
  Follow up Call-     09/23/2023    8:36 AM 09/23/2023    8:30 AM 07/28/2023    8:32 AM  Call back number  Post procedure Call Back phone  # [phone]  [phone]  Permission to leave phone message  Yes Yes     Patient questions:  Do you have a fever, pain , or abdominal swelling? No. Pain Score  0 *  Have you tolerated food without any problems? Yes.    Have you been able to return to your normal activities? Yes.    Do you have any questions about your discharge instructions: Diet   No. Medications  No. Follow up visit  No.  Do you have questions or concerns about your Care? No.  Actions: * If pain score is 4 or above: No action needed, pain <4.

## 2023-09-25 LAB — SURGICAL PATHOLOGY

## 2023-10-04 ENCOUNTER — Ambulatory Visit: Payer: Self-pay | Admitting: Gastroenterology

## 2023-10-04 NOTE — Progress Notes (Signed)
 Johnny Conner,  The biopsies of your esophagus again showed inflammation with elevated eosinophils. This could be from acid reflux, or from a condition called eosinophilic esophagitis.   Please continue to take the rabeprazole  twice a day as instructed and plan to repeat EGD to see if the inflammation has healed.

## 2023-10-21 ENCOUNTER — Encounter: Payer: Self-pay | Admitting: Gastroenterology

## 2023-12-21 ENCOUNTER — Telehealth: Payer: Self-pay | Admitting: Gastroenterology

## 2023-12-21 NOTE — Telephone Encounter (Signed)
 Patient text NO'' to automated reminder, called him today at 12:16 pm to confirm his apt, patient did not respond. Left a voice message for patient to call back.

## 2023-12-25 ENCOUNTER — Encounter: Admitting: Gastroenterology

## 2023-12-27 ENCOUNTER — Emergency Department (HOSPITAL_COMMUNITY)

## 2023-12-27 ENCOUNTER — Other Ambulatory Visit: Payer: Self-pay

## 2023-12-27 ENCOUNTER — Encounter (HOSPITAL_COMMUNITY): Payer: Self-pay

## 2023-12-27 ENCOUNTER — Emergency Department (HOSPITAL_COMMUNITY)
Admission: EM | Admit: 2023-12-27 | Discharge: 2023-12-27 | Disposition: A | Discharge status: Home / Self Care | Attending: Emergency Medicine | Admitting: Emergency Medicine

## 2023-12-27 DIAGNOSIS — Z23 Encounter for immunization: Secondary | ICD-10-CM | POA: Insufficient documentation

## 2023-12-27 DIAGNOSIS — S61315A Laceration without foreign body of left ring finger with damage to nail, initial encounter: Secondary | ICD-10-CM | POA: Insufficient documentation

## 2023-12-27 DIAGNOSIS — W28XXXA Contact with powered lawn mower, initial encounter: Secondary | ICD-10-CM | POA: Insufficient documentation

## 2023-12-27 MED ORDER — CALCIUM CARBONATE ANTACID 500 MG PO CHEW
3.0000 | CHEWABLE_TABLET | Freq: Once | ORAL | Status: AC
  Administered 2023-12-27: 600 mg via ORAL
  Filled 2023-12-27: qty 3

## 2023-12-27 MED ORDER — CEPHALEXIN 250 MG PO CAPS
500.0000 mg | ORAL_CAPSULE | Freq: Once | ORAL | Status: AC
  Administered 2023-12-27: 500 mg via ORAL
  Filled 2023-12-27: qty 2

## 2023-12-27 MED ORDER — CEPHALEXIN 500 MG PO CAPS
500.0000 mg | ORAL_CAPSULE | Freq: Two times a day (BID) | ORAL | 0 refills | Status: AC
Start: 2023-12-27 — End: ?

## 2023-12-27 MED ORDER — LIDOCAINE HCL (PF) 1 % IJ SOLN
30.0000 mL | Freq: Once | INTRAMUSCULAR | Status: AC
  Administered 2023-12-27: 30 mL
  Filled 2023-12-27: qty 30

## 2023-12-27 MED ORDER — TETANUS-DIPHTH-ACELL PERTUSSIS 5-2.5-18.5 LF-MCG/0.5 IM SUSY
0.5000 mL | PREFILLED_SYRINGE | Freq: Once | INTRAMUSCULAR | Status: AC
  Administered 2023-12-27: 0.5 mL via INTRAMUSCULAR
  Filled 2023-12-27: qty 0.5

## 2023-12-27 NOTE — Discharge Instructions (Addendum)
 The sutures should fall out in 5 to 7 days.  Keep the wound clean and dry for the first 24 hours.  After that you can wash it with soap and water.  Monitor for signs of infection.  Return for new or worsening symptoms.  Take antibiotics as directed.

## 2023-12-27 NOTE — ED Triage Notes (Signed)
 Pt arrives POV after getting his hand stuck in the bottom of a lawn mower. He was trying to fix the blade and his left ring finger got caught. Bleeding from the finger nail on ring finger and pain in all other fingers. PMS intact

## 2023-12-27 NOTE — ED Notes (Signed)
 Initial acuity for this pt was made by mistake. ED Charge RN made aware

## 2023-12-27 NOTE — ED Provider Notes (Signed)
 [facility] Emergency Department Provider Note MRN:  [id number]  Arrival date & time: 12/27/23     Chief Complaint   Finger Injury   History of Present Illness   Johnny Conner is a 28 y.o. year-old male presents to the ED with chief complaint of left ring finger injury.  States that he was working on a lawnmower blade and it cut the tip of his finger.  He denies any other injuries.  Bleeding is controlled prior to arrival.  He complains of mild pain.SABRA  History provided by patient.   Review of Systems  Pertinent positive and negative review of systems noted in HPI.    Physical Exam   Vitals:   12/27/23 1907  BP: (!) 139/95  Pulse: (!) 103  Resp: 17  Temp: 97.6 F (36.4 C)  SpO2: 95%    CONSTITUTIONAL:  non toxic-appearing, NAD NEURO:  Alert and oriented x 3, CN 3-12 grossly intact EYES:  eyes equal and reactive ENT/NECK:  Supple, no stridor  CARDIO:  mildly tachycardic, regular rhythm, appears well-perfused  PULM:  No respiratory distress,  GI/GU:  non-distended,  MSK/SPINE:  No gross deformities, no edema, moves all extremities SKIN:  no rash, shallow laceration to the left distal fourth finger, the nail has been partially avulsed   *Additional and/or pertinent findings included in MDM below  Diagnostic and Interventional Summary    EKG Interpretation Date/Time:    Ventricular Rate:    PR Interval:    QRS Duration:    QT Interval:    QTC Calculation:   R Axis:      Text Interpretation:         Labs Reviewed - No data to display  DG Hand Complete Left  Final Result      Medications  lidocaine  (PF) (XYLOCAINE ) 1 % injection 30 mL (30 mLs Infiltration Given 12/27/23 2106)  Tdap (BOOSTRIX ) injection 0.5 mL (0.5 mLs Intramuscular Given 12/27/23 2021)  calcium  carbonate (TUMS - dosed in mg elemental calcium ) chewable tablet 600 mg of elemental calcium  (600 mg of elemental calcium  Oral Given 12/27/23 2105)  cephALEXin  (KEFLEX )  capsule 500 mg (500 mg Oral Given 12/27/23 2104)     Procedures  /  Critical Care .Laceration Repair  Date/Time: 12/27/2023 9:08 PM  Performed by: Vicky Charleston, PA-C Authorized by: Vicky Charleston, PA-C   Consent:    Consent obtained:  Verbal   Consent given by:  Patient   Risks, benefits, and alternatives were discussed: yes     Risks discussed:  Infection, pain, poor cosmetic result and poor wound healing   Alternatives discussed:  No treatment Universal protocol:    Procedure explained and questions answered to patient or proxy's satisfaction: yes     Relevant documents present and verified: yes     Test results available: yes     Imaging studies available: yes     Required blood products, implants, devices, and special equipment available: yes     Immediately prior to procedure, a time out was called: yes     Patient identity confirmed:  Verbally with patient Anesthesia:    Anesthesia method:  Nerve block   Block location:  4th finger ring block   Block needle gauge:  27 G   Block anesthetic:  Lidocaine  1% w/o epi   Block technique:  Ring   Block injection procedure:  Anatomic landmarks identified, introduced needle, negative aspiration for blood and anatomic landmarks palpated   Block outcome:  Anesthesia  achieved Laceration details:    Location:  Finger   Finger location:  L ring finger   Length (cm):  0.5 Exploration:    Wound exploration: wound explored through full range of motion and entire depth of wound visualized     Contaminated: yes   Treatment:    Area cleansed with:  Povidone-iodine and saline   Amount of cleaning:  Standard Skin repair:    Repair method:  Sutures   Suture technique:  Simple interrupted   Number of sutures:  2 Approximation:    Approximation:  Close Repair type:    Repair type:  Simple Post-procedure details:    Dressing:  Antibiotic ointment   Procedure completion:  Tolerated well, no immediate complications   ED Course and  Medical Decision Making  I have reviewed the triage vital signs, the nursing notes, and pertinent available records from the EMR.  Social Determinants Affecting Complexity of Care: Patient has no clinically significant social determinants affecting this chief complaint..   ED Course: Clinical Course as of 12/27/23 2108  [place] Dec 27, 2023  2108 DG Hand Complete Left No obvious fracture [RB]    Clinical Course User Index [RB] Vicky Charleston, PA-C    Medical Decision Making Patient had a his left fourth finger hit by a lawnmower blade.  It avulsed the nail.  There was a subungual hematoma and the proximal portion of the nail was unseated.  I proceeded with removal of the fingernail completely and discovered a 0.5 cm nailbed laceration.  This was cleansed and repaired.  The fingernail was not replaced.  Patient discharged home on Keflex .  Return precautions discussed.  Amount and/or Complexity of Data Reviewed Radiology: ordered. Decision-making details documented in ED Course.  Risk OTC drugs. Prescription drug management.         Consultants: No consultations were needed in caring for this patient.   Treatment and Plan: Emergency department workup does not suggest an emergent condition requiring admission or immediate intervention beyond  what has been performed at this time. The patient is safe for discharge and has  been instructed to return immediately for worsening symptoms, change in  symptoms or any other concerns    Final Clinical Impressions(s) / ED Diagnoses     ICD-10-CM   1. Laceration of left ring finger without foreign body with damage to nail, initial encounter  S61.315A       ED Discharge Orders          Ordered    cephALEXin  (KEFLEX ) 500 MG capsule  2 times daily        12/27/23 2103              Discharge Instructions Discussed with and Provided to Patient:     Discharge Instructions      The sutures should fall out in 5 to 7 days.   Keep the wound clean and dry for the first 24 hours.  After that you can wash it with soap and water.  Monitor for signs of infection.  Return for new or worsening symptoms.  Take antibiotics as directed.       Vicky Charleston, PA-C 12/27/23 2110    Pamella Ozell LABOR, DO 01/01/24 1143
